# Patient Record
Sex: Female | Born: 1935 | Race: White | Hispanic: No | Smoking: Former smoker
Health system: Southern US, Community
[De-identification: ages and names within clinical notes are randomized; demographics above are authoritative.]

## PROBLEM LIST (undated history)

## (undated) DIAGNOSIS — Z950 Presence of cardiac pacemaker: Secondary | ICD-10-CM

## (undated) DIAGNOSIS — D649 Anemia, unspecified: Secondary | ICD-10-CM

## (undated) DIAGNOSIS — J9610 Chronic respiratory failure, unspecified whether with hypoxia or hypercapnia: Secondary | ICD-10-CM

## (undated) DIAGNOSIS — I459 Conduction disorder, unspecified: Secondary | ICD-10-CM

## (undated) DIAGNOSIS — Z955 Presence of coronary angioplasty implant and graft: Secondary | ICD-10-CM

## (undated) DIAGNOSIS — E039 Hypothyroidism, unspecified: Secondary | ICD-10-CM

## (undated) DIAGNOSIS — G459 Transient cerebral ischemic attack, unspecified: Secondary | ICD-10-CM

## (undated) DIAGNOSIS — I10 Essential (primary) hypertension: Secondary | ICD-10-CM

## (undated) DIAGNOSIS — J8410 Pulmonary fibrosis, unspecified: Secondary | ICD-10-CM

## (undated) DIAGNOSIS — E119 Type 2 diabetes mellitus without complications: Secondary | ICD-10-CM

## (undated) DIAGNOSIS — Z9289 Personal history of other medical treatment: Secondary | ICD-10-CM

## (undated) HISTORY — DX: Pulmonary fibrosis, unspecified: J84.10

## (undated) HISTORY — DX: Transient cerebral ischemic attack, unspecified: G45.9

## (undated) HISTORY — DX: Presence of cardiac pacemaker: Z95.0

## (undated) HISTORY — DX: Essential (primary) hypertension: I10

## (undated) HISTORY — DX: Hypothyroidism, unspecified: E03.9

## (undated) HISTORY — DX: Chronic respiratory failure, unspecified whether with hypoxia or hypercapnia: J96.10

## (undated) HISTORY — DX: Type 2 diabetes mellitus without complications: E11.9

## (undated) HISTORY — DX: Personal history of other medical treatment: Z92.89

## (undated) HISTORY — DX: Anemia, unspecified: D64.9

## (undated) HISTORY — DX: Presence of coronary angioplasty implant and graft: Z95.5

## (undated) HISTORY — DX: Conduction disorder, unspecified: I45.9

---

## 1979-08-23 HISTORY — PX: ABDOMINAL HYSTERECTOMY: SHX81

## 2000-07-30 ENCOUNTER — Emergency Department (HOSPITAL_COMMUNITY): Admission: EM | Admit: 2000-07-30 | Discharge: 2000-07-30 | Payer: Self-pay

## 2009-08-22 HISTORY — PX: BREAST LUMPECTOMY: SHX2

## 2010-08-22 HISTORY — PX: CORONARY STENT PLACEMENT: SHX1402

## 2016-08-22 HISTORY — PX: CARDIAC PACEMAKER PLACEMENT: SHX583

## 2021-07-29 ENCOUNTER — Other Ambulatory Visit: Payer: Self-pay

## 2021-07-29 ENCOUNTER — Ambulatory Visit (INDEPENDENT_AMBULATORY_CARE_PROVIDER_SITE_OTHER): Payer: Medicare Other | Admitting: Orthopedic Surgery

## 2021-07-29 ENCOUNTER — Encounter: Payer: Self-pay | Admitting: Orthopedic Surgery

## 2021-07-29 VITALS — BP 132/60 | HR 71 | Temp 96.6°F | Ht 60.0 in | Wt 116.4 lb

## 2021-07-29 DIAGNOSIS — I252 Old myocardial infarction: Secondary | ICD-10-CM

## 2021-07-29 DIAGNOSIS — J8410 Pulmonary fibrosis, unspecified: Secondary | ICD-10-CM | POA: Diagnosis not present

## 2021-07-29 DIAGNOSIS — E1165 Type 2 diabetes mellitus with hyperglycemia: Secondary | ICD-10-CM

## 2021-07-29 DIAGNOSIS — I442 Atrioventricular block, complete: Secondary | ICD-10-CM | POA: Diagnosis not present

## 2021-07-29 DIAGNOSIS — M25551 Pain in right hip: Secondary | ICD-10-CM

## 2021-07-29 DIAGNOSIS — E039 Hypothyroidism, unspecified: Secondary | ICD-10-CM

## 2021-07-29 DIAGNOSIS — E119 Type 2 diabetes mellitus without complications: Secondary | ICD-10-CM

## 2021-07-29 DIAGNOSIS — G8929 Other chronic pain: Secondary | ICD-10-CM

## 2021-07-29 DIAGNOSIS — R5383 Other fatigue: Secondary | ICD-10-CM

## 2021-07-29 DIAGNOSIS — K219 Gastro-esophageal reflux disease without esophagitis: Secondary | ICD-10-CM

## 2021-07-29 DIAGNOSIS — R413 Other amnesia: Secondary | ICD-10-CM

## 2021-07-29 DIAGNOSIS — H3530 Unspecified macular degeneration: Secondary | ICD-10-CM

## 2021-07-29 NOTE — Progress Notes (Signed)
Careteam: No care team member to display  Seen by: Windell Moulding, AGNP-C  PLACE OF SERVICE:  [place] Directive information Does Patient Have a Medical Advance Directive?: Yes, Type of Advance Directive: [place];Living will, Does patient want to make changes to medical advance directive?: No - Patient declined  Allergies  Allergen Reactions   Epinephrine    Lidocaine-Epinephrine     Chief Complaint  Patient presents with   New Patient (Initial Visit)    Patient presents today for a new patient appointment.     HPI: Patient is a 87 y.o. female seen today to establish at [facility].   Previous provider was [facility] in [place], [place]. Last provider was [facility] 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 Widowed. Has 3 children. Moved to [place] 5 days ago. Lives at [place] in [place]. 2 cats at home with her.   T2DM- diagnosed around around age 55. She does not take medication at this time. Sometimes checks her blood sugar, averages 120's. A1c checked a few months ago, cannot remember. No hypoglycemic events. Diet controlled. She does get yearly eye exams.   Thyroid- diagnosed with hypothyroidism in her teens. Does not take medication on a empty stomach. On levothyroxine daily.   Pulmonary fibrosis- diagnosed in 2012, she began wearing oxygen at night. 2 years she began wearing oxygen full time. Remains in 2 liters. Needs new oxygen equipment, especially concentrator. Denies sob. Requesting pulmonary referral.   Heart attack with stent in 2012. Remains on metoprolol daily. Denies recent chest pain. Complete heart block- s/p pacemaker 2018. Requesting cardiology referral.   Heartburn- taking nexium daily, does not know food triggers.   Macular degeneration- involves both eyes, remains on lutein-Zeaxanthin caps daily. Daughter working on finding her ophthalmologist. .   Right hip pain- could  not have surgery due to chronic conditions and interaction with anesthesia, uses rolator to ambulate. No recent falls. Taking tylenol arthritis twice daily.   Memory- daughter reports becoming more forgetful at times.  Hospitalized 07/2020- due to neck pain and low hemoglobin (6.8), received blood transfusion, remains on iron. Denies black tarry stools, hematuria or coffee ground emesis.   Family history: Father- deceased at age 49- complications from surgery Mother- deceased at age 49- enlarged heart Sister- alive age 52- dementia  Colonoscopy- aged out Mammograms- last done 2021, does not want anymore Bone density- believes she has had in the past, cannot recall result  Smoking: smoked for 50 years about 1.5 PPD Alcohol: small glass of wine 2-3x/week Drugs: no past drug use  Diet: drinks coffee daily, eats 2 small meals daily, follows low carb/sugar diet Exercise: no structured exercise plan due to hip pain, light walking daily  Dentist- Dr. Jetty Duhamel, scheduled to be seen in 2023  She has a living will and HPOA.   Advised to get Shingles vaccine at local pharmacy.     Review of Systems:  Review of Systems  Constitutional:  Positive for malaise/fatigue. Negative for chills, fever and weight loss.  HENT:  Positive for hearing loss and sore throat.   Eyes:  Positive for blurred vision.       Macular degeneration  Respiratory:  Positive for cough and shortness of breath. Negative for wheezing.        Oxygen use  Cardiovascular:  Negative for chest pain and leg swelling.  Gastrointestinal:  Positive for constipation and heartburn. Negative for  abdominal pain, blood in stool, diarrhea, nausea and vomiting.  Genitourinary:  Negative for dysuria and hematuria.  Musculoskeletal:  Positive for joint pain. Negative for falls.       Right hip pain  Skin: Negative.   Neurological:  Positive for weakness. Negative for dizziness and headaches.  Endo/Heme/Allergies:  Positive for  environmental allergies.  Psychiatric/Behavioral:  Positive for depression and memory loss. The patient is nervous/anxious.        Confusion   Past Medical History:  Diagnosis Date   Anemia    Heart block    History of blood transfusion    History of heart artery stent    Hypertension    Hypothyroid    Pacemaker    Pulmonary fibrosis (HCC)    Respiratory failure, chronic ([place])    Type 2 diabetes mellitus ([place])    Past Surgical History:  Procedure Laterality Date   ABDOMINAL HYSTERECTOMY  1981   BREAST LUMPECTOMY  2011   CARDIAC PACEMAKER PLACEMENT  2018   CORONARY STENT PLACEMENT  2012   Social History:   reports that she has quit smoking. Her smoking use included cigarettes. She has a 50.00 pack-year smoking history. She has never used smokeless tobacco. She reports current alcohol use. She reports that she does not use drugs.  Family History  Problem Relation Age of Onset   Other Mother        Enlarged Heart   Dementia Sister     Medications: Patient's Medications  New Prescriptions   No medications on file  Previous Medications   ACETAMINOPHEN (TYLENOL 8 HOUR ARTHRITIS PAIN PO)    Take 600 mg by mouth in the morning and at bedtime.   ESOMEPRAZOLE MAGNESIUM (NEXIUM PO)    Take by mouth.   LEVOTHYROXINE (SYNTHROID) 125 MCG TABLET    Take 125 mcg by mouth daily.   LUTEIN-ZEAXANTHIN 20-1 MG CAPS    Take by mouth.   METOPROLOL SUCCINATE PO    Take 25 mg by mouth daily.   OXYGEN    Inhale into the lungs. 2 Liters  Modified Medications   No medications on file  Discontinued Medications   No medications on file    Physical Exam:  Vitals:   07/29/21 1321  BP: 132/60  Pulse: 71  Temp: (!) 96.6 F (35.9 C)  SpO2: 99%  Weight: 116 lb 6.4 oz (52.8 kg)  Height: 5' (1.524 m)   Body mass index is 22.73 kg/m. Wt Readings from Last 3 Encounters:  07/29/21 116 lb 6.4 oz (52.8 kg)    Physical Exam Vitals reviewed.  Constitutional:      General: She is not in  acute distress. HENT:     Head: Normocephalic.  Eyes:     General:        Right eye: No discharge.        Left eye: No discharge.  Neck:     Vascular: No carotid bruit.  Cardiovascular:     Rate and Rhythm: Normal rate and regular rhythm.     Pulses: Normal pulses.     Heart sounds: Murmur heard.  Pulmonary:     Effort: Pulmonary effort is normal. No respiratory distress.     Breath sounds: Normal breath sounds. No wheezing.  Abdominal:     General: Bowel sounds are normal. There is no distension.     Palpations: Abdomen is soft.     Tenderness: There is no abdominal tenderness.  Musculoskeletal:     Cervical back:  Normal range of motion.     Right lower leg: No edema.     Left lower leg: No edema.  Lymphadenopathy:     Cervical: No cervical adenopathy.  Skin:    General: Skin is warm and dry.     Capillary Refill: Capillary refill takes less than 2 seconds.  Neurological:     General: No focal deficit present.     Mental Status: She is alert and oriented to person, place, and time.     Motor: Weakness present.     Gait: Gait abnormal.     Comments: rolator  Psychiatric:        Mood and Affect: Mood normal.        Behavior: Behavior normal.    Labs reviewed: Basic Metabolic Panel: No results for input(s): NA, K, CL, CO2, GLUCOSE, BUN, CREATININE, CALCIUM, MG, PHOS, TSH in the last 8760 hours. Liver Function Tests: No results for input(s): AST, ALT, ALKPHOS, BILITOT, PROT, ALBUMIN in the last 8760 hours. No results for input(s): LIPASE, AMYLASE in the last 8760 hours. No results for input(s): AMMONIA in the last 8760 hours. CBC: No results for input(s): WBC, NEUTROABS, HGB, HCT, MCV, PLT in the last 8760 hours. Lipid Panel: No results for input(s): CHOL, HDL, LDLCALC, TRIG, CHOLHDL, LDLDIRECT in the last 8760 hours. TSH: No results for input(s): TSH in the last 8760 hours. A1C: No results found for: HGBA1C   Assessment/Plan 1. Complete heart block ([place]) - s/p  pacemaker 2018 - Ambulatory referral to Cardiology  2. Pulmonary fibrosis ([place]) - remains on oxygen- 2 liters - exam unremarkable - Ambulatory referral to Pulmonology  3. Type 2 diabetes mellitus without long-term current use of insulin (HCC) - diet controlled - no recent hypoglycemia - gets yearly eye exams  - CMP - A1c- future - foot exam- future  4. Hypothyroidism, unspecified type - diagnosed around age 85 - cont levothyroxine - TSH- 1.35 07/29/2021  5. Gastroesophageal reflux disease without esophagitis - CBC with Differential/Platelet -hgb- 13.3 07/29/2021 - cont Prilosec daily  6. Macular degeneration of both eyes, unspecified type - plans to schedule with ophthalmology soon - cont lutein vitamin  7. Chronic right hip pain - poor surgical candidate  - abnormal gait, no left hip pain - ambulates with rolator - cont tylenol arthritis - start voltaren gel 1%- apply to right hip tid prn - may consider ortho for steroid injections  8. Memory impairment - daughter reports she is more forgetful - MMSE- future  9. Past heart attack - s/p 2012 - reports having stent placed - remains on metoprolol  Total time: 52 minutes. Greater than 50% of total time spent doing patient education on chronic conditions and health maintenance.    Next appt: 09/02/2021  Windell Moulding, [facility] [phone]

## 2021-07-29 NOTE — Patient Instructions (Addendum)
Shingrix vaccine- may get shingles vaccine at any pharmacy  Voltaren gel 1 %- apply to area 3-4x/daily  Recommend tylenol 1000 mg twice daily

## 2021-07-30 ENCOUNTER — Other Ambulatory Visit: Payer: Self-pay | Admitting: Orthopedic Surgery

## 2021-07-30 DIAGNOSIS — R413 Other amnesia: Secondary | ICD-10-CM | POA: Insufficient documentation

## 2021-07-30 DIAGNOSIS — H3530 Unspecified macular degeneration: Secondary | ICD-10-CM | POA: Insufficient documentation

## 2021-07-30 DIAGNOSIS — J8410 Pulmonary fibrosis, unspecified: Secondary | ICD-10-CM

## 2021-07-30 DIAGNOSIS — G8929 Other chronic pain: Secondary | ICD-10-CM | POA: Insufficient documentation

## 2021-07-30 DIAGNOSIS — K219 Gastro-esophageal reflux disease without esophagitis: Secondary | ICD-10-CM | POA: Insufficient documentation

## 2021-07-30 DIAGNOSIS — I252 Old myocardial infarction: Secondary | ICD-10-CM | POA: Insufficient documentation

## 2021-07-30 DIAGNOSIS — I442 Atrioventricular block, complete: Secondary | ICD-10-CM | POA: Insufficient documentation

## 2021-07-30 DIAGNOSIS — E039 Hypothyroidism, unspecified: Secondary | ICD-10-CM | POA: Insufficient documentation

## 2021-07-30 DIAGNOSIS — E1165 Type 2 diabetes mellitus with hyperglycemia: Secondary | ICD-10-CM | POA: Insufficient documentation

## 2021-08-02 ENCOUNTER — Other Ambulatory Visit: Payer: Self-pay

## 2021-08-02 ENCOUNTER — Other Ambulatory Visit: Payer: Self-pay | Admitting: Orthopedic Surgery

## 2021-08-02 ENCOUNTER — Other Ambulatory Visit: Payer: Self-pay | Admitting: *Deleted

## 2021-08-02 ENCOUNTER — Ambulatory Visit (INDEPENDENT_AMBULATORY_CARE_PROVIDER_SITE_OTHER): Payer: Medicare Other | Admitting: Cardiology

## 2021-08-02 ENCOUNTER — Encounter: Payer: Self-pay | Admitting: Cardiology

## 2021-08-02 ENCOUNTER — Telehealth: Payer: Self-pay | Admitting: *Deleted

## 2021-08-02 VITALS — BP 132/70 | HR 60 | Ht 60.0 in | Wt 116.6 lb

## 2021-08-02 DIAGNOSIS — Z9981 Dependence on supplemental oxygen: Secondary | ICD-10-CM

## 2021-08-02 DIAGNOSIS — I2510 Atherosclerotic heart disease of native coronary artery without angina pectoris: Secondary | ICD-10-CM

## 2021-08-02 DIAGNOSIS — E782 Mixed hyperlipidemia: Secondary | ICD-10-CM | POA: Diagnosis not present

## 2021-08-02 DIAGNOSIS — I480 Paroxysmal atrial fibrillation: Secondary | ICD-10-CM

## 2021-08-02 DIAGNOSIS — J8410 Pulmonary fibrosis, unspecified: Secondary | ICD-10-CM

## 2021-08-02 LAB — COMPREHENSIVE METABOLIC PANEL
AG Ratio: 1.7 (calc) (ref 1.0–2.5)
ALT: 18 U/L (ref 6–29)
AST: 17 U/L (ref 10–35)
Albumin: 4.3 g/dL (ref 3.6–5.1)
Alkaline phosphatase (APISO): 81 U/L (ref 37–153)
BUN: 14 mg/dL (ref 7–25)
CO2: 26 mmol/L (ref 20–32)
Calcium: 9.7 mg/dL (ref 8.6–10.4)
Chloride: 104 mmol/L (ref 98–110)
Creat: 0.76 mg/dL (ref 0.60–0.95)
Globulin: 2.6 g/dL (calc) (ref 1.9–3.7)
Glucose, Bld: 103 mg/dL (ref 65–139)
Potassium: 4.4 mmol/L (ref 3.5–5.3)
Sodium: 141 mmol/L (ref 135–146)
Total Bilirubin: 0.5 mg/dL (ref 0.2–1.2)
Total Protein: 6.9 g/dL (ref 6.1–8.1)

## 2021-08-02 LAB — CBC WITH DIFFERENTIAL/PLATELET
Absolute Monocytes: 540 cells/uL (ref 200–950)
Basophils Absolute: 58 cells/uL (ref 0–200)
Basophils Relative: 0.8 %
Eosinophils Absolute: 108 cells/uL (ref 15–500)
Eosinophils Relative: 1.5 %
HCT: 40.7 % (ref 35.0–45.0)
Hemoglobin: 13.3 g/dL (ref 11.7–15.5)
Lymphs Abs: 2016 cells/uL (ref 850–3900)
MCH: 28.8 pg (ref 27.0–33.0)
MCHC: 32.7 g/dL (ref 32.0–36.0)
MCV: 88.1 fL (ref 80.0–100.0)
MPV: 11.3 fL (ref 7.5–12.5)
Monocytes Relative: 7.5 %
Neutro Abs: 4478 cells/uL (ref 1500–7800)
Neutrophils Relative %: 62.2 %
Platelets: 309 10*3/uL (ref 140–400)
RBC: 4.62 10*6/uL (ref 3.80–5.10)
RDW: 13.1 % (ref 11.0–15.0)
Total Lymphocyte: 28 %
WBC: 7.2 10*3/uL (ref 3.8–10.8)

## 2021-08-02 LAB — TEST AUTHORIZATION

## 2021-08-02 LAB — HEMOGLOBIN A1C
Hgb A1c MFr Bld: 6.7 % of total Hgb — ABNORMAL HIGH (ref <5.7)
Mean Plasma Glucose: 146 mg/dL
eAG (mmol/L): 8.1 mmol/L

## 2021-08-02 LAB — TSH: TSH: 1.35 mIU/L (ref 0.40–4.50)

## 2021-08-02 MED ORDER — RIVAROXABAN 10 MG PO TABS: 10.0000 mg | ORAL_TABLET | Freq: Every day | ORAL | 3 refills | Status: DC

## 2021-08-02 NOTE — Telephone Encounter (Signed)
Orders placed- can you please fax to [facility]. Thanks

## 2021-08-02 NOTE — Telephone Encounter (Signed)
Order faxed to [facility]

## 2021-08-02 NOTE — Patient Instructions (Addendum)
Medication Instructions:  Your physician has recommended you make the following change in your medication:  START: Xarelto 10 mg once daily *If you need a refill on your cardiac medications before your next appointment, please call your pharmacy*   Lab Work: None If you have labs (blood work) drawn today and your tests are completely normal, you will receive your results only by: [place] (if you have MyChart) OR A paper copy in the mail If you have any lab test that is abnormal or we need to change your treatment, we will call you to review the results.   Testing/Procedures: Your physician has requested that you have an echocardiogram. Echocardiography is a painless test that uses sound waves to create images of your heart. It provides your doctor with information about the size and shape of your heart and how well your heart's chambers and valves are working. This procedure takes approximately one hour. There are no restrictions for this procedure.    Follow-Up: At [facility], you and your health needs are our priority.  As part of our continuing mission to provide you with exceptional heart care, we have created designated Provider Care Teams.  These Care Teams include your primary Cardiologist (physician) and Advanced Practice Providers (APPs -  Physician Assistants and Nurse Practitioners) who all work together to provide you with the care you need, when you need it.  We recommend signing up for the patient portal called "MyChart".  Sign up information is provided on this After Visit Summary.  MyChart is used to connect with patients for Virtual Visits (Telemedicine).  Patients are able to view lab/test results, encounter notes, upcoming appointments, etc.  Non-urgent messages can be sent to your provider as well.   To learn more about what you can do with MyChart, go to [email or web address].    Your next appointment:   6 month(s)  The format for your next appointment:    In Person  Provider:   Berniece Salines, DO   Other Instructions  Echocardiogram An echocardiogram is a test that uses sound waves (ultrasound) to produce images of the heart. Images from an echocardiogram can provide important information about: Heart size and shape. The size and thickness and movement of your heart's walls. Heart muscle function and strength. Heart valve function or if you have stenosis. Stenosis is when the heart valves are too narrow. If blood is flowing backward through the heart valves (regurgitation). A tumor or infectious growth around the heart valves. Areas of heart muscle that are not working well because of poor blood flow or injury from a heart attack. Aneurysm detection. An aneurysm is a weak or damaged part of an artery wall. The wall bulges out from the normal force of blood pumping through the body. Tell a health care provider about: Any allergies you have. All medicines you are taking, including vitamins, herbs, eye drops, creams, and over-the-counter medicines. Any blood disorders you have. Any surgeries you have had. Any medical conditions you have. Whether you are pregnant or may be pregnant. What are the risks? Generally, this is a safe test. However, problems may occur, including an allergic reaction to dye (contrast) that may be used during the test. What happens before the test? No specific preparation is needed. You may eat and drink normally. What happens during the test?  You will take off your clothes from the waist up and put on a hospital gown. Electrodes or electrocardiogram (ECG)patches may be placed on your chest. The electrodes  or patches are then connected to a device that monitors your heart rate and rhythm. You will lie down on a table for an ultrasound exam. A gel will be applied to your chest to help sound waves pass through your skin. A handheld device, called a transducer, will be pressed against your chest and moved over your  heart. The transducer produces sound waves that travel to your heart and bounce back (or "echo" back) to the transducer. These sound waves will be captured in real-time and changed into images of your heart that can be viewed on a video monitor. The images will be recorded on a computer and reviewed by your health care provider. You may be asked to change positions or hold your breath for a short time. This makes it easier to get different views or better views of your heart. In some cases, you may receive contrast through an IV in one of your veins. This can improve the quality of the pictures from your heart. The procedure may vary among health care providers and hospitals. What can I expect after the test? You may return to your normal, everyday life, including diet, activities, and medicines, unless your health care provider tells you not to do that. Follow these instructions at home: It is up to you to get the results of your test. Ask your health care provider, or the department that is doing the test, when your results will be ready. Keep all follow-up visits. This is important. Summary An echocardiogram is a test that uses sound waves (ultrasound) to produce images of the heart. Images from an echocardiogram can provide important information about the size and shape of your heart, heart muscle function, heart valve function, and other possible heart problems. You do not need to do anything to prepare before this test. You may eat and drink normally. After the echocardiogram is completed, you may return to your normal, everyday life, unless your health care provider tells you not to do that. This information is not intended to replace advice given to you by your health care provider. Make sure you discuss any questions you have with your health care provider. Document Revised: 04/21/2021 Document Reviewed: 03/31/2020 Elsevier Patient Education  2022 [employer].

## 2021-08-02 NOTE — Progress Notes (Signed)
Cardiology Office Note:    Date:  08/05/2021   ID:  Monica Murillo, DOB 08-Dec-1932, MRN [id number]  PCP:  Yvonna Alanis, NP  Cardiologist:  Berniece Salines, DO  Electrophysiologist:  None   Referring MD: Yvonna Alanis, NP   "I am fine"  History of Present Illness:    Monica BRENNER is a 87 y.o. female with a hx of coronary artery disease status post stent in 2012, complete heart block status post dual-chamber [employer] pacemaker, paroxysmal atrial fibrillation per her cardiologist note she was on Xarelto 10 mg daily, hyperlipidemia and pulmonary fibrosis.  The patient is moved to [place] recently to live near her daughter and is here to establish cardiac care. Today she offers no complaints.  She tells me that she is doing well.  Past Medical History:  Diagnosis Date   Anemia    Heart block    History of blood transfusion    History of heart artery stent    Hypertension    Hypothyroid    Pacemaker    Pulmonary fibrosis (HCC)    Respiratory failure, chronic (HCC)    Type 2 diabetes mellitus ([place])     Past Surgical History:  Procedure Laterality Date   ABDOMINAL HYSTERECTOMY  1981   BREAST LUMPECTOMY  2011   CARDIAC PACEMAKER PLACEMENT  2018   CORONARY STENT PLACEMENT  2012    Current Medications: Current Meds  Medication Sig   Acetaminophen (TYLENOL 8 HOUR ARTHRITIS PAIN PO) Take 600 mg by mouth in the morning and at bedtime.   Esomeprazole Magnesium (NEXIUM PO) Take by mouth.   levothyroxine (SYNTHROID) 125 MCG tablet Take 125 mcg by mouth daily.   Lutein-Zeaxanthin 20-1 MG CAPS Take by mouth.   METOPROLOL SUCCINATE PO Take 25 mg by mouth daily.   OXYGEN Inhale into the lungs. 2 Liters   [DISCONTINUED] rivaroxaban (XARELTO) 10 MG TABS tablet Take 1 tablet (10 mg total) by mouth daily.     Allergies:   Epinephrine, Lidocaine-epinephrine, and Sulfa antibiotics   Social History   Socioeconomic History   Marital status: Widowed    Spouse name:  Not on file   Number of children: Not on file   Years of education: Not on file   Highest education level: Not on file  Occupational History   Not on file  Tobacco Use   Smoking status: Former    Packs/day: 1.00    Years: 50.00    Pack years: 50.00    Types: Cigarettes   Smokeless tobacco: Never  Vaping Use   Vaping Use: Never used  Substance and Sexual Activity   Alcohol use: Yes    Comment: 2-3 times a week   Drug use: Never   Sexual activity: Not on file  Other Topics Concern   Not on file  Social History Narrative   Diet:      Caffeine:Coffee      Married, if yes what year: Widow, 1959      Do you live in a house, apartment, assisted living, condo, trailer, ect: Senior Living Apartment      Is it one or more stories:       How many persons live in your home? 1      Pets: 2 Cats      Highest level or education completed: Nursing bachelors      Current/Past profession:      Exercise: No  Type and how often: Walking         Living Will: Yes   DNR: No   POA/HPOA: Yes      Functional Status:   Do you have difficulty bathing or dressing yourself? Yes, sometimes dressing   Do you have difficulty preparing food or eating? No    Do you have difficulty managing your medications? No    Do you have difficulty managing your finances? Yes   Do you have difficulty affording your medications? No    Social Determinants of Radio broadcast assistant Strain: Not on file  Food Insecurity: Not on file  Transportation Needs: Not on file  Physical Activity: Not on file  Stress: Not on file  Social Connections: Not on file     Family History: The patient's family history includes Dementia in her sister; Other in her mother.  ROS:   Review of Systems  Constitution: Negative for decreased appetite, fever and weight gain.  HENT: Negative for congestion, ear discharge, hoarse voice and sore throat.   Eyes: Negative for discharge, redness, vision loss in  right eye and visual halos.  Cardiovascular: Negative for chest pain, dyspnea on exertion, leg swelling, orthopnea and palpitations.  Respiratory: Negative for cough, hemoptysis, shortness of breath and snoring.   Endocrine: Negative for heat intolerance and polyphagia.  Hematologic/Lymphatic: Negative for bleeding problem. Does not bruise/bleed easily.  Skin: Negative for flushing, nail changes, rash and suspicious lesions.  Musculoskeletal: Negative for arthritis, joint pain, muscle cramps, myalgias, neck pain and stiffness.  Gastrointestinal: Negative for abdominal pain, bowel incontinence, diarrhea and excessive appetite.  Genitourinary: Negative for decreased libido, genital sores and incomplete emptying.  Neurological: Negative for brief paralysis, focal weakness, headaches and loss of balance.  Psychiatric/Behavioral: Negative for altered mental status, depression and suicidal ideas.  Allergic/Immunologic: Negative for HIV exposure and persistent infections.    EKGs/Labs/Other Studies Reviewed:    The following studies were reviewed today:   EKG:  The ekg ordered today demonstrates   Recent Labs: 07/29/2021: ALT 18; BUN 14; Creat 0.76; Hemoglobin 13.3; Platelets 309; Potassium 4.4; Sodium 141; TSH 1.35  Recent Lipid Panel No results found for: CHOL, TRIG, HDL, CHOLHDL, VLDL, LDLCALC, LDLDIRECT  Physical Exam:    VS:  BP 132/70 (BP Location: Right Arm)   Pulse 60   Ht 5' (1.524 m)   Wt 116 lb 9.6 oz (52.9 kg)   SpO2 94%   BMI 22.77 kg/m     Wt Readings from Last 3 Encounters:  08/02/21 116 lb 9.6 oz (52.9 kg)  07/29/21 116 lb 6.4 oz (52.8 kg)     GEN: Well nourished, well developed in no acute distress HEENT: Normal NECK: No JVD; No carotid bruits LYMPHATICS: No lymphadenopathy CARDIAC: S1S2 noted,RRR, no murmurs, rubs, gallops RESPIRATORY:  Clear to auscultation without rales, wheezing or rhonchi  ABDOMEN: Soft, non-tender, non-distended, +bowel sounds, no  guarding. EXTREMITIES: No edema, No cyanosis, no clubbing MUSCULOSKELETAL:  No deformity  SKIN: Warm and dry NEUROLOGIC:  Alert and oriented x 3, non-focal PSYCHIATRIC:  Normal affect, good insight  ASSESSMENT:    1. PAF (paroxysmal atrial fibrillation) ([place])   2. Coronary artery disease involving native coronary artery of native heart, unspecified whether angina present   3. Mixed hyperlipidemia    PLAN:      She has no anginal symptoms in the office today.  We went over all of her medications it appears that the patient had not been taking her Xarelto 10  mg daily.  She notes that her previous cardiologist has stopped this medication but based on his note which was brought in by the patient's daughter from her most recent visit it appears that the cardiologist had continued the patient on Xarelto 10 mg daily.  She has not had any major bleeding or any GI bleeding so we will go ahead and restart her Xarelto for her stroke prevention in the setting of atrial fibrillation.  She has paroxysmal atrial fibrillation and her CHA2DS2-VASc score is 4.  I talked to the patient about restarting of the Xarelto she does agree with this plan.  I will set her up also with my EP partners given the fact that she has her pacemaker implantation to get her set up with our pacemaker clinic for her interrogations.  With her history of coronary artery disease she is not on a statin.  She is apprehensive right now we will discuss this again to start the patient on low-dose statin medications.  We will plan for repeat lipid profile as well.  The patient is in agreement with the above plan. The patient left the office in stable condition.  The patient will follow up in   Medication Adjustments/Labs and Tests Ordered: Current medicines are reviewed at length with the patient today.  Concerns regarding medicines are outlined above.  Orders Placed This Encounter  Procedures   Ambulatory referral to Cardiac  Electrophysiology   EKG 12-Lead   ECHOCARDIOGRAM COMPLETE   Meds ordered this encounter  Medications   DISCONTD: rivaroxaban (XARELTO) 10 MG TABS tablet    Sig: Take 1 tablet (10 mg total) by mouth daily.    Dispense:  90 tablet    Refill:  3    Patient Instructions  Medication Instructions:  Your physician has recommended you make the following change in your medication:  START: Xarelto 10 mg once daily *If you need a refill on your cardiac medications before your next appointment, please call your pharmacy*   Lab Work: None If you have labs (blood work) drawn today and your tests are completely normal, you will receive your results only by: [place] (if you have MyChart) OR A paper copy in the mail If you have any lab test that is abnormal or we need to change your treatment, we will call you to review the results.   Testing/Procedures: Your physician has requested that you have an echocardiogram. Echocardiography is a painless test that uses sound waves to create images of your heart. It provides your doctor with information about the size and shape of your heart and how well your heart's chambers and valves are working. This procedure takes approximately one hour. There are no restrictions for this procedure.    Follow-Up: At [facility], you and your health needs are our priority.  As part of our continuing mission to provide you with exceptional heart care, we have created designated Provider Care Teams.  These Care Teams include your primary Cardiologist (physician) and Advanced Practice Providers (APPs -  Physician Assistants and Nurse Practitioners) who all work together to provide you with the care you need, when you need it.  We recommend signing up for the patient portal called "MyChart".  Sign up information is provided on this After Visit Summary.  MyChart is used to connect with patients for Virtual Visits (Telemedicine).  Patients are able to view lab/test  results, encounter notes, upcoming appointments, etc.  Non-urgent messages can be sent to your provider as well.  To learn more about what you can do with MyChart, go to [email or web address].    Your next appointment:   6 month(s)  The format for your next appointment:   In Person  Provider:   Berniece Salines, DO   Other Instructions  Echocardiogram An echocardiogram is a test that uses sound waves (ultrasound) to produce images of the heart. Images from an echocardiogram can provide important information about: Heart size and shape. The size and thickness and movement of your heart's walls. Heart muscle function and strength. Heart valve function or if you have stenosis. Stenosis is when the heart valves are too narrow. If blood is flowing backward through the heart valves (regurgitation). A tumor or infectious growth around the heart valves. Areas of heart muscle that are not working well because of poor blood flow or injury from a heart attack. Aneurysm detection. An aneurysm is a weak or damaged part of an artery wall. The wall bulges out from the normal force of blood pumping through the body. Tell a health care provider about: Any allergies you have. All medicines you are taking, including vitamins, herbs, eye drops, creams, and over-the-counter medicines. Any blood disorders you have. Any surgeries you have had. Any medical conditions you have. Whether you are pregnant or may be pregnant. What are the risks? Generally, this is a safe test. However, problems may occur, including an allergic reaction to dye (contrast) that may be used during the test. What happens before the test? No specific preparation is needed. You may eat and drink normally. What happens during the test?  You will take off your clothes from the waist up and put on a hospital gown. Electrodes or electrocardiogram (ECG)patches may be placed on your chest. The electrodes or patches are then connected to  a device that monitors your heart rate and rhythm. You will lie down on a table for an ultrasound exam. A gel will be applied to your chest to help sound waves pass through your skin. A handheld device, called a transducer, will be pressed against your chest and moved over your heart. The transducer produces sound waves that travel to your heart and bounce back (or "echo" back) to the transducer. These sound waves will be captured in real-time and changed into images of your heart that can be viewed on a video monitor. The images will be recorded on a computer and reviewed by your health care provider. You may be asked to change positions or hold your breath for a short time. This makes it easier to get different views or better views of your heart. In some cases, you may receive contrast through an IV in one of your veins. This can improve the quality of the pictures from your heart. The procedure may vary among health care providers and hospitals. What can I expect after the test? You may return to your normal, everyday life, including diet, activities, and medicines, unless your health care provider tells you not to do that. Follow these instructions at home: It is up to you to get the results of your test. Ask your health care provider, or the department that is doing the test, when your results will be ready. Keep all follow-up visits. This is important. Summary An echocardiogram is a test that uses sound waves (ultrasound) to produce images of the heart. Images from an echocardiogram can provide important information about the size and shape of your heart, heart muscle function, heart valve function, and other possible heart problems.  You do not need to do anything to prepare before this test. You may eat and drink normally. After the echocardiogram is completed, you may return to your normal, everyday life, unless your health care provider tells you not to do that. This information is not  intended to replace advice given to you by your health care provider. Make sure you discuss any questions you have with your health care provider. Document Revised: 04/21/2021 Document Reviewed: 03/31/2020 Elsevier Patient Education  2022 [place].   Adopting a Healthy Lifestyle.  Know what a healthy weight is for you (roughly BMI <25) and aim to maintain this   Aim for 7+ servings of fruits and vegetables daily   65-80+ fluid ounces of water or unsweet tea for healthy kidneys   Limit to max 1 drink of alcohol per day; avoid smoking/tobacco   Limit animal fats in diet for cholesterol and heart health - choose grass fed whenever available   Avoid highly processed foods, and foods high in saturated/trans fats   Aim for low stress - take time to unwind and care for your mental health   Aim for 150 min of moderate intensity exercise weekly for heart health, and weights twice weekly for bone health   Aim for 7-9 hours of sleep daily   When it comes to diets, agreement about the perfect plan isnt easy to find, even among the experts. Experts at the [employer] developed an idea known as the Healthy Eating Plate. Just imagine a plate divided into logical, healthy portions.   The emphasis is on diet quality:   Load up on vegetables and fruits - one-half of your plate: Aim for color and variety, and remember that potatoes dont count.   Go for whole grains - one-quarter of your plate: Whole wheat, barley, wheat berries, quinoa, oats, brown rice, and foods made with them. If you want pasta, go with whole wheat pasta.   Protein power - one-quarter of your plate: Fish, chicken, beans, and nuts are all healthy, versatile protein sources. Limit red meat.   The diet, however, does go beyond the plate, offering a few other suggestions.   Use healthy plant oils, such as olive, canola, soy, corn, sunflower and peanut. Check the labels, and avoid partially hydrogenated  oil, which have unhealthy trans fats.   If youre thirsty, drink water. Coffee and tea are good in moderation, but skip sugary drinks and limit milk and dairy products to one or two daily servings.   The type of carbohydrate in the diet is more important than the amount. Some sources of carbohydrates, such as vegetables, fruits, whole grains, and beans-are healthier than others.   Finally, stay active  Signed, Berniece Salines, DO  08/05/2021 9:33 PM    [facility]

## 2021-08-02 NOTE — Telephone Encounter (Signed)
Patient daughter Monica Murillo called and stated that [facility] received the orders for Oxygen but now they need an Order faxed to them for an Overnight Oximetry.   Needs order faxed to [place].  Please Advise.

## 2021-08-03 MED ORDER — RIVAROXABAN 10 MG PO TABS: 10.0000 mg | ORAL_TABLET | Freq: Every day | ORAL | 3 refills | Status: DC

## 2021-08-10 ENCOUNTER — Ambulatory Visit (INDEPENDENT_AMBULATORY_CARE_PROVIDER_SITE_OTHER): Payer: Medicare Other | Admitting: Pulmonary Disease

## 2021-08-10 ENCOUNTER — Encounter: Payer: Self-pay | Admitting: Pulmonary Disease

## 2021-08-10 ENCOUNTER — Other Ambulatory Visit: Payer: Self-pay

## 2021-08-10 VITALS — BP 126/68 | HR 66 | Ht 60.0 in | Wt 116.4 lb

## 2021-08-10 DIAGNOSIS — J9611 Chronic respiratory failure with hypoxia: Secondary | ICD-10-CM

## 2021-08-10 DIAGNOSIS — J849 Interstitial pulmonary disease, unspecified: Secondary | ICD-10-CM

## 2021-08-10 NOTE — Patient Instructions (Addendum)
We will place home oxygen orders for a home concentrator and supplies.   Once your home oxygen is established with a company in [place] we will check your oxygen levels at night when sleeping.   We will check a CT Chest scan  We will have you follow up in 6-8 weeks for pulmonary function tests

## 2021-08-10 NOTE — Progress Notes (Signed)
Synopsis: Referred in December 2022 for Chronic respiratory failure by Windell Moulding, NP  Subjective:   PATIENT ID: Monica Murillo GENDER: female DOB: Feb 10, 1933, MRN: [id number]   HPI  Chief Complaint  Patient presents with   Consult    Referred by PCP for history of pulmonary fibrosis and resp failure. Currently on 2L 24/7.     Monica Murillo is an 87 year old woman, former smoker with chronic hypoxemic respiratory failure due to interstitial lung disease who was referred to pulmonary clinic to establish care.  She recently moved from [place] to [place] and is here to establish care with a new pulmonology group.  She is currently using 2 L of supplemental oxygen throughout the day and night.  Record review notes that she has history of interstitial lung disease with pulmonary fibrosis.  She is a former smoker with a 50+ pack year smoking history.  She quit in the early 2000's.  No formal PFTs or CT chest were done by her primary care team.  She has established care with Dr. Berniece Salines of cardiology and an echo has been ordered.  Patient denies any chest pain, lower extremity edema, cough, wheezing or sputum production.  She is accompanied by her daughter today.  Past Medical History:  Diagnosis Date   Anemia    Heart block    History of blood transfusion    History of heart artery stent    Hypertension    Hypothyroid    Pacemaker    Pulmonary fibrosis (HCC)    Respiratory failure, chronic (HCC)    Type 2 diabetes mellitus ([place])      Family History  Problem Relation Age of Onset   Other Mother        Enlarged Heart   Dementia Sister      Social History   Socioeconomic History   Marital status: Widowed    Spouse name: Not on file   Number of children: Not on file   Years of education: Not on file   Highest education level: Not on file  Occupational History   Not on file  Tobacco Use   Smoking status: Former    Packs/day: 1.00    Years: 50.00     Pack years: 50.00    Types: Cigarettes   Smokeless tobacco: Never  Vaping Use   Vaping Use: Never used  Substance and Sexual Activity   Alcohol use: Yes    Comment: 2-3 times a week   Drug use: Never   Sexual activity: Not on file  Other Topics Concern   Not on file  Social History Narrative   Diet:      Caffeine:Coffee      Married, if yes what year: Widow, 1959      Do you live in a house, apartment, assisted living, condo, trailer, ect: Senior Living Apartment      Is it one or more stories:       How many persons live in your home? 1      Pets: 2 Cats      Highest level or education completed: Nursing bachelors      Current/Past profession:      Exercise: No                 Type and how often: Walking         Living Will: Yes   DNR: No   POA/HPOA: Yes      Functional Status:  Do you have difficulty bathing or dressing yourself? Yes, sometimes dressing   Do you have difficulty preparing food or eating? No    Do you have difficulty managing your medications? No    Do you have difficulty managing your finances? Yes   Do you have difficulty affording your medications? No    Social Determinants of Radio broadcast assistant Strain: Not on file  Food Insecurity: Not on file  Transportation Needs: Not on file  Physical Activity: Not on file  Stress: Not on file  Social Connections: Not on file  Intimate Partner Violence: Not on file     Allergies  Allergen Reactions   Epinephrine    Lidocaine-Epinephrine    Sulfa Antibiotics Nausea And Vomiting and Rash    Sulfa and related       Outpatient Medications Prior to Visit  Medication Sig Dispense Refill   Acetaminophen (TYLENOL 8 HOUR ARTHRITIS PAIN PO) Take 600 mg by mouth in the morning and at bedtime.     Esomeprazole Magnesium (NEXIUM PO) Take by mouth.     levothyroxine (SYNTHROID) 125 MCG tablet Take 125 mcg by mouth daily.     Lutein-Zeaxanthin 20-1 MG CAPS Take by mouth.     METOPROLOL  SUCCINATE PO Take 25 mg by mouth daily.     OXYGEN Inhale into the lungs. 2 Liters     rivaroxaban (XARELTO) 10 MG TABS tablet Take 1 tablet (10 mg total) by mouth daily. 90 tablet 3   No facility-administered medications prior to visit.    Review of Systems  Constitutional:  Negative for chills, fever, malaise/fatigue and weight loss.  HENT:  Negative for congestion, sinus pain and sore throat.   Eyes: Negative.   Respiratory:  Positive for shortness of breath. Negative for cough, hemoptysis, sputum production and wheezing.   Cardiovascular:  Negative for chest pain, palpitations, orthopnea, claudication and leg swelling.  Gastrointestinal:  Negative for abdominal pain, heartburn, nausea and vomiting.  Genitourinary: Negative.   Musculoskeletal:  Negative for joint pain and myalgias.  Skin:  Negative for rash.  Neurological:  Negative for weakness.  Endo/Heme/Allergies: Negative.   Psychiatric/Behavioral: Negative.       Objective:   Vitals:   08/10/21 1437  BP: 126/68  Pulse: 66  SpO2: 94%  Weight: 116 lb 6.4 oz (52.8 kg)  Height: 5' (1.524 m)     Physical Exam Constitutional:      General: She is not in acute distress.    Appearance: She is not ill-appearing.  HENT:     Head: Normocephalic and atraumatic.  Eyes:     General: No scleral icterus.    Conjunctiva/sclera: Conjunctivae normal.     Pupils: Pupils are equal, round, and reactive to light.  Cardiovascular:     Rate and Rhythm: Normal rate and regular rhythm.     Pulses: Normal pulses.     Heart sounds: Normal heart sounds. No murmur heard. Pulmonary:     Effort: Pulmonary effort is normal.     Breath sounds: Rales (bibasilar) present. No wheezing or rhonchi.  Abdominal:     General: Bowel sounds are normal.     Palpations: Abdomen is soft.  Musculoskeletal:     Right lower leg: No edema.     Left lower leg: No edema.  Lymphadenopathy:     Cervical: No cervical adenopathy.  Skin:    General: Skin  is warm and dry.  Neurological:     General: No focal deficit present.  Mental Status: She is alert.  Psychiatric:        Mood and Affect: Mood normal.        Behavior: Behavior normal.        Thought Content: Thought content normal.        Judgment: Judgment normal.    CBC    Component Value Date/Time   WBC 7.2 07/29/2021 1431   RBC 4.62 07/29/2021 1431   HGB 13.3 07/29/2021 1431   HCT 40.7 07/29/2021 1431   PLT 309 07/29/2021 1431   MCV 88.1 07/29/2021 1431   MCH 28.8 07/29/2021 1431   MCHC 32.7 07/29/2021 1431   RDW 13.1 07/29/2021 1431   LYMPHSABS 2,016 07/29/2021 1431   EOSABS 108 07/29/2021 1431   BASOSABS 58 07/29/2021 1431   BMP Latest Ref Rng & Units 07/29/2021  Glucose 65 - 139 mg/dL 103  BUN 7 - 25 mg/dL 14  Creatinine 0.60 - 0.95 mg/dL 0.76  BUN/Creat Ratio 6 - 22 (calc) NOT APPLICABLE  Sodium 161 - 146 mmol/L 141  Potassium 3.5 - 5.3 mmol/L 4.4  Chloride 98 - 110 mmol/L 104  CO2 20 - 32 mmol/L 26  Calcium 8.6 - 10.4 mg/dL 9.7   Chest imaging:  PFT: No flowsheet data found.  Labs:  Path:  Echo:  Heart Catheterization:  Assessment & Plan:   ILD (interstitial lung disease) ([place]) - Plan: CT CHEST HIGH RESOLUTION, Pulmonary Function Test  Chronic respiratory failure with hypoxia (HCC)  Discussion: Monica Murillo is an 87 year old woman, former smoker with chronic hypoxemic respiratory failure due to interstitial lung disease who was referred to pulmonary clinic to establish care.  Patient has history of interstitial lung disease as documented from her care team in [place].  She is to continue supplemental oxygen 2 L throughout the day and night. We will check a high resolution CT chest scan and pulmonary function tests for further evaluation.  We discussed the importance of maintaining her current conditioning with frequent exercise/walking.  We will consider referral to pulmonary rehab in the future once we have work-up complete.  We will  order her home oxygen supplies and a home oxygen concentrator.  She currently has a portable oxygen concentrator.  Follow-up in 6 to 8 weeks with pulmonary function testing.  Freda Jackson, MD [facility] Office: [phone]    Current Outpatient Medications:    Acetaminophen (TYLENOL 8 HOUR ARTHRITIS PAIN PO), Take 600 mg by mouth in the morning and at bedtime., Disp: , Rfl:    Esomeprazole Magnesium (NEXIUM PO), Take by mouth., Disp: , Rfl:    levothyroxine (SYNTHROID) 125 MCG tablet, Take 125 mcg by mouth daily., Disp: , Rfl:    Lutein-Zeaxanthin 20-1 MG CAPS, Take by mouth., Disp: , Rfl:    METOPROLOL SUCCINATE PO, Take 25 mg by mouth daily., Disp: , Rfl:    OXYGEN, Inhale into the lungs. 2 Liters, Disp: , Rfl:    rivaroxaban (XARELTO) 10 MG TABS tablet, Take 1 tablet (10 mg total) by mouth daily., Disp: 90 tablet, Rfl: 3

## 2021-08-18 ENCOUNTER — Inpatient Hospital Stay (HOSPITAL_COMMUNITY)
Admission: EM | Admit: 2021-08-18 | Discharge: 2021-08-23 | DRG: 481 | Disposition: A | Discharge status: Skilled Nursing Facility | Payer: Medicare Other | Attending: Internal Medicine | Admitting: Internal Medicine

## 2021-08-18 ENCOUNTER — Emergency Department (HOSPITAL_COMMUNITY): Payer: Medicare Other

## 2021-08-18 DIAGNOSIS — Z20822 Contact with and (suspected) exposure to covid-19: Secondary | ICD-10-CM | POA: Diagnosis present

## 2021-08-18 DIAGNOSIS — Z87891 Personal history of nicotine dependence: Secondary | ICD-10-CM

## 2021-08-18 DIAGNOSIS — I442 Atrioventricular block, complete: Secondary | ICD-10-CM | POA: Diagnosis present

## 2021-08-18 DIAGNOSIS — K219 Gastro-esophageal reflux disease without esophagitis: Secondary | ICD-10-CM | POA: Diagnosis present

## 2021-08-18 DIAGNOSIS — M25551 Pain in right hip: Secondary | ICD-10-CM | POA: Diagnosis not present

## 2021-08-18 DIAGNOSIS — Z955 Presence of coronary angioplasty implant and graft: Secondary | ICD-10-CM

## 2021-08-18 DIAGNOSIS — I480 Paroxysmal atrial fibrillation: Secondary | ICD-10-CM | POA: Diagnosis present

## 2021-08-18 DIAGNOSIS — Z888 Allergy status to other drugs, medicaments and biological substances status: Secondary | ICD-10-CM

## 2021-08-18 DIAGNOSIS — I10 Essential (primary) hypertension: Secondary | ICD-10-CM

## 2021-08-18 DIAGNOSIS — J9611 Chronic respiratory failure with hypoxia: Secondary | ICD-10-CM | POA: Diagnosis present

## 2021-08-18 DIAGNOSIS — Z79899 Other long term (current) drug therapy: Secondary | ICD-10-CM

## 2021-08-18 DIAGNOSIS — Z79890 Hormone replacement therapy (postmenopausal): Secondary | ICD-10-CM

## 2021-08-18 DIAGNOSIS — S72144A Nondisplaced intertrochanteric fracture of right femur, initial encounter for closed fracture: Secondary | ICD-10-CM

## 2021-08-18 DIAGNOSIS — E039 Hypothyroidism, unspecified: Secondary | ICD-10-CM | POA: Diagnosis not present

## 2021-08-18 DIAGNOSIS — W050XXA Fall from non-moving wheelchair, initial encounter: Secondary | ICD-10-CM | POA: Diagnosis present

## 2021-08-18 DIAGNOSIS — I252 Old myocardial infarction: Secondary | ICD-10-CM

## 2021-08-18 DIAGNOSIS — M549 Dorsalgia, unspecified: Secondary | ICD-10-CM | POA: Diagnosis present

## 2021-08-18 DIAGNOSIS — I2510 Atherosclerotic heart disease of native coronary artery without angina pectoris: Secondary | ICD-10-CM | POA: Diagnosis present

## 2021-08-18 DIAGNOSIS — J8410 Pulmonary fibrosis, unspecified: Secondary | ICD-10-CM | POA: Diagnosis present

## 2021-08-18 DIAGNOSIS — Z9981 Dependence on supplemental oxygen: Secondary | ICD-10-CM

## 2021-08-18 DIAGNOSIS — S7291XA Unspecified fracture of right femur, initial encounter for closed fracture: Secondary | ICD-10-CM | POA: Diagnosis present

## 2021-08-18 DIAGNOSIS — M80051A Age-related osteoporosis with current pathological fracture, right femur, initial encounter for fracture: Principal | ICD-10-CM | POA: Diagnosis present

## 2021-08-18 DIAGNOSIS — Z66 Do not resuscitate: Secondary | ICD-10-CM | POA: Diagnosis present

## 2021-08-18 DIAGNOSIS — Z882 Allergy status to sulfonamides status: Secondary | ICD-10-CM

## 2021-08-18 DIAGNOSIS — M1611 Unilateral primary osteoarthritis, right hip: Secondary | ICD-10-CM | POA: Diagnosis present

## 2021-08-18 DIAGNOSIS — S72001A Fracture of unspecified part of neck of right femur, initial encounter for closed fracture: Secondary | ICD-10-CM

## 2021-08-18 DIAGNOSIS — Z419 Encounter for procedure for purposes other than remedying health state, unspecified: Secondary | ICD-10-CM

## 2021-08-18 DIAGNOSIS — Y92009 Unspecified place in unspecified non-institutional (private) residence as the place of occurrence of the external cause: Secondary | ICD-10-CM

## 2021-08-18 DIAGNOSIS — Z950 Presence of cardiac pacemaker: Secondary | ICD-10-CM

## 2021-08-18 DIAGNOSIS — E1165 Type 2 diabetes mellitus with hyperglycemia: Secondary | ICD-10-CM | POA: Diagnosis present

## 2021-08-18 DIAGNOSIS — Z90710 Acquired absence of both cervix and uterus: Secondary | ICD-10-CM

## 2021-08-18 DIAGNOSIS — Z09 Encounter for follow-up examination after completed treatment for conditions other than malignant neoplasm: Secondary | ICD-10-CM

## 2021-08-18 DIAGNOSIS — W19XXXA Unspecified fall, initial encounter: Secondary | ICD-10-CM

## 2021-08-18 DIAGNOSIS — Z7901 Long term (current) use of anticoagulants: Secondary | ICD-10-CM

## 2021-08-18 DIAGNOSIS — Y92039 Unspecified place in apartment as the place of occurrence of the external cause: Secondary | ICD-10-CM

## 2021-08-18 DIAGNOSIS — Z886 Allergy status to analgesic agent status: Secondary | ICD-10-CM

## 2021-08-18 DIAGNOSIS — D72829 Elevated white blood cell count, unspecified: Secondary | ICD-10-CM | POA: Diagnosis present

## 2021-08-18 LAB — COMPREHENSIVE METABOLIC PANEL
ALT: 21 U/L (ref 0–44)
AST: 28 U/L (ref 15–41)
Albumin: 3.9 g/dL (ref 3.5–5.0)
Alkaline Phosphatase: 82 U/L (ref 38–126)
Anion gap: 12 (ref 5–15)
BUN: 17 mg/dL (ref 8–23)
CO2: 20 mmol/L — ABNORMAL LOW (ref 22–32)
Calcium: 8.8 mg/dL — ABNORMAL LOW (ref 8.9–10.3)
Chloride: 104 mmol/L (ref 98–111)
Creatinine, Ser: 0.76 mg/dL (ref 0.44–1.00)
GFR, Estimated: >60 mL/min (ref >60)
Glucose, Bld: 196 mg/dL — ABNORMAL HIGH (ref 70–99)
Potassium: 4.3 mmol/L (ref 3.5–5.1)
Sodium: 136 mmol/L (ref 135–145)
Total Bilirubin: 1 mg/dL (ref 0.3–1.2)
Total Protein: 6.1 g/dL — ABNORMAL LOW (ref 6.5–8.1)

## 2021-08-18 LAB — CBC WITH DIFFERENTIAL/PLATELET
Abs Immature Granulocytes: 0.13 10*3/uL — ABNORMAL HIGH (ref 0.00–0.07)
Basophils Absolute: 0.1 10*3/uL (ref 0.0–0.1)
Basophils Relative: 1 %
Eosinophils Absolute: 0.1 10*3/uL (ref 0.0–0.5)
Eosinophils Relative: 1 %
HCT: 43 % (ref 36.0–46.0)
Hemoglobin: 13.3 g/dL (ref 12.0–15.0)
Immature Granulocytes: 1 %
Lymphocytes Relative: 17 %
Lymphs Abs: 2.1 10*3/uL (ref 0.7–4.0)
MCH: 29.2 pg (ref 26.0–34.0)
MCHC: 30.9 g/dL (ref 30.0–36.0)
MCV: 94.5 fL (ref 80.0–100.0)
Monocytes Absolute: 0.6 10*3/uL (ref 0.1–1.0)
Monocytes Relative: 5 %
Neutro Abs: 9.3 10*3/uL — ABNORMAL HIGH (ref 1.7–7.7)
Neutrophils Relative %: 75 %
Platelets: 208 10*3/uL (ref 150–400)
RBC: 4.55 MIL/uL (ref 3.87–5.11)
RDW: 13.6 % (ref 11.5–15.5)
WBC: 12.4 10*3/uL — ABNORMAL HIGH (ref 4.0–10.5)
nRBC: 0 % (ref 0.0–0.2)

## 2021-08-18 LAB — PROTIME-INR
INR: 1.1 (ref 0.8–1.2)
Prothrombin Time: 14.4 seconds (ref 11.4–15.2)

## 2021-08-18 IMAGING — DX DG KNEE COMPLETE 4+V*R*
4 series · 4 of 4 positions shown · non-contrast
Comparison: [DATE]

CLINICAL DATA: Fell, hip fracture, pain

EXAM:
RIGHT KNEE - COMPLETE 4+ VIEW

[knee ap]
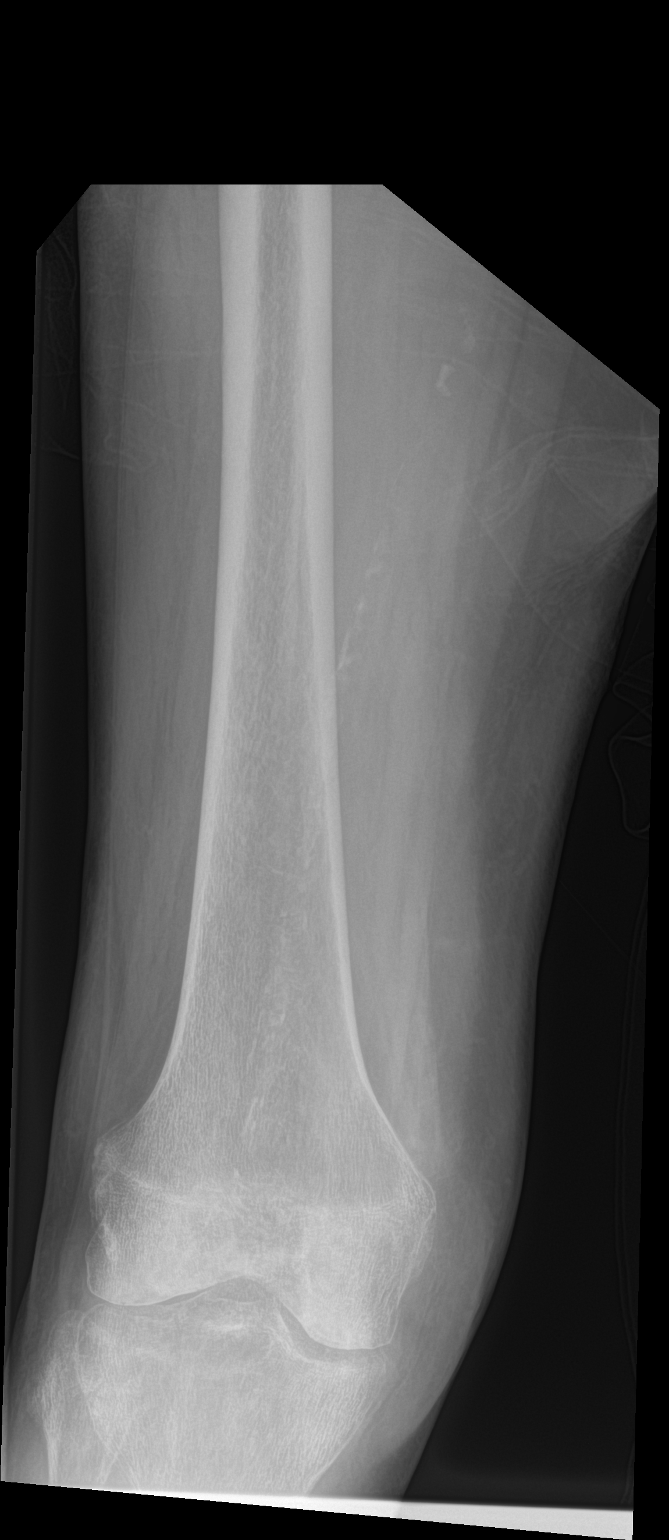

[knee lat]
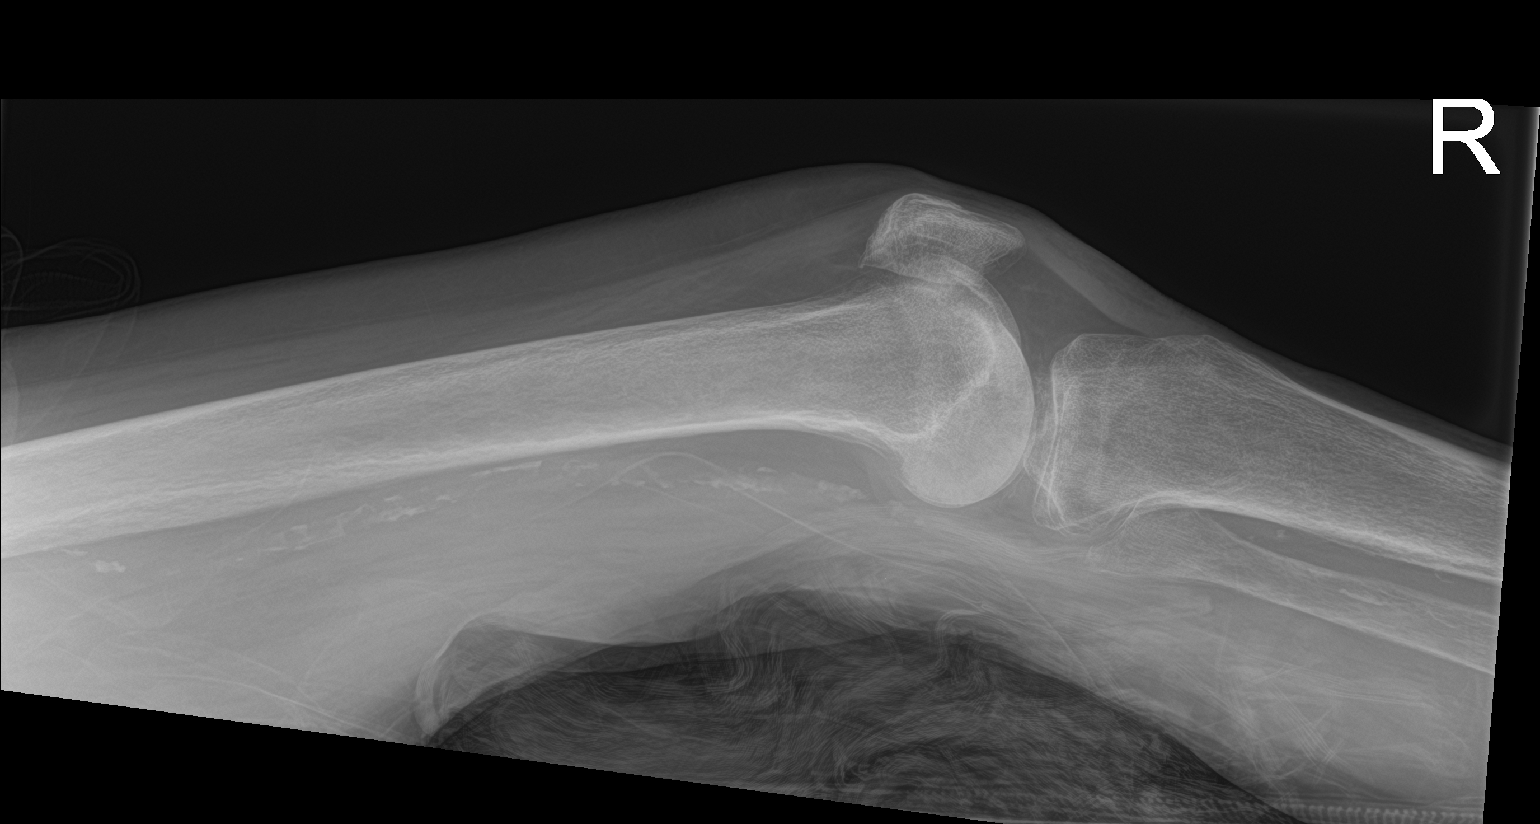

[knee obl (1 of 2)]
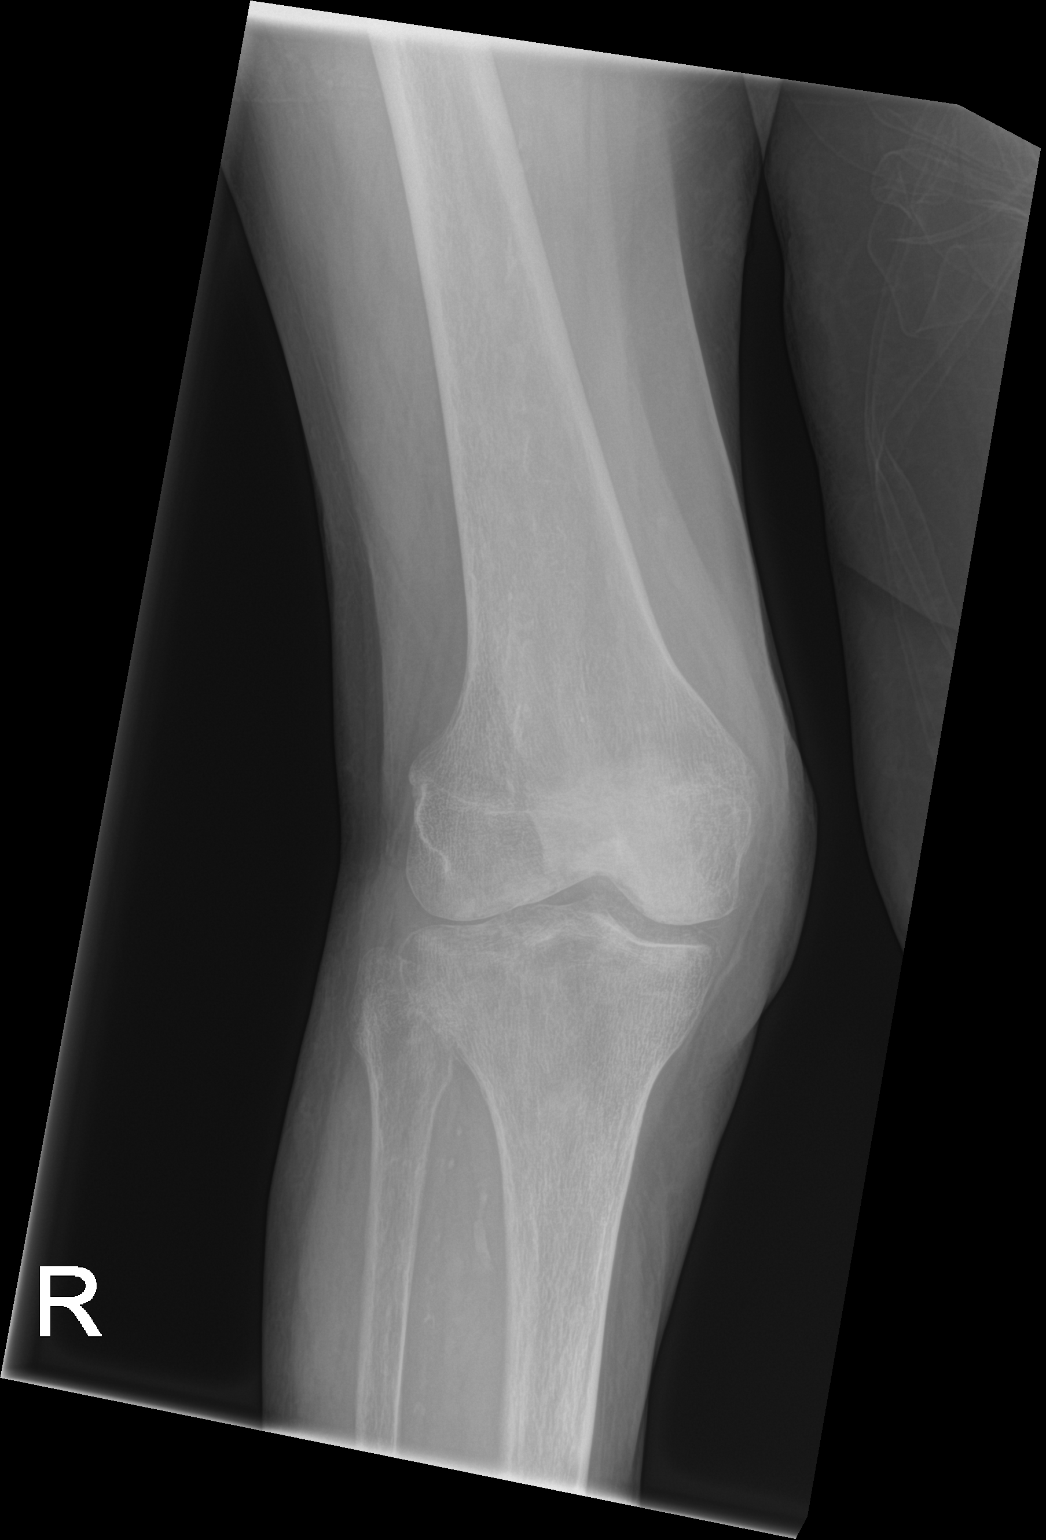

[knee obl (2 of 2)]
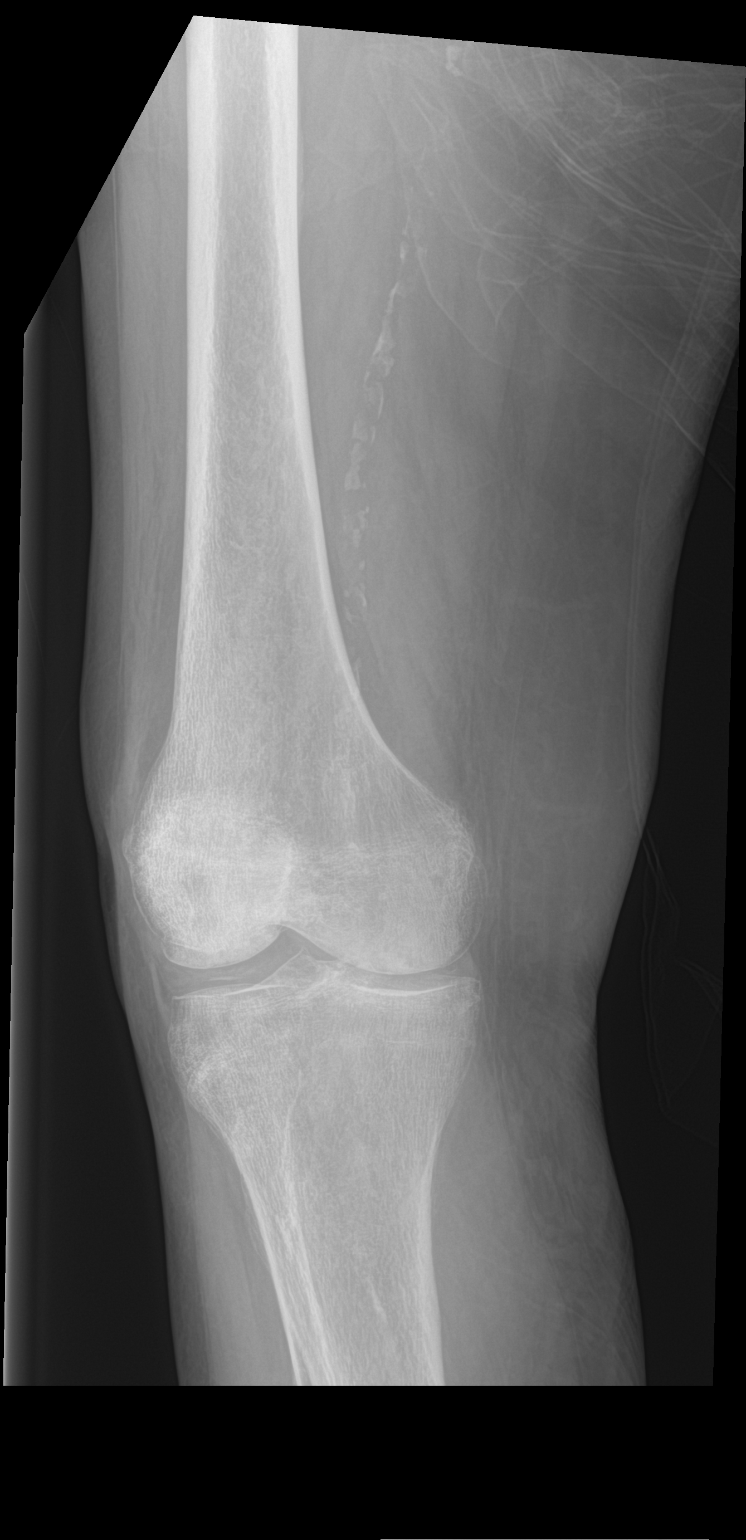

[4 of 4 positions shown; findings below may reference images not displayed]

FINDINGS: Frontal, bilateral oblique, and cross-table lateral views of the
right knee are obtained. No fracture, subluxation, or dislocation.
There is mild 3 compartmental osteoarthritis. No joint effusion.
Soft tissues are unremarkable.
IMPRESSION: 1. Mild osteoarthritis.  No acute fracture.

## 2021-08-18 IMAGING — CT CT HEAD W/O CM
3 series · 16 of 47 positions shown, 19 images · non-contrast
Comparison: None.

CLINICAL DATA: Head trauma.

EXAM:
CT HEAD WITHOUT CONTRAST
TECHNIQUE: Contiguous axial images were obtained from the base of the skull
through the vertex without intravenous contrast.

[Series 3: head 5.0 h30s · axial · 0.38mm/px · z∈[-5,+120]mm · 10 of 30 slices shown, 13 images]
[im 3/30  brain]
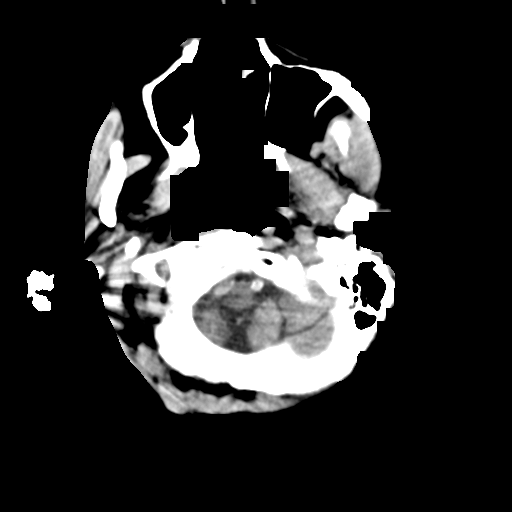
[im 3/30  bone]
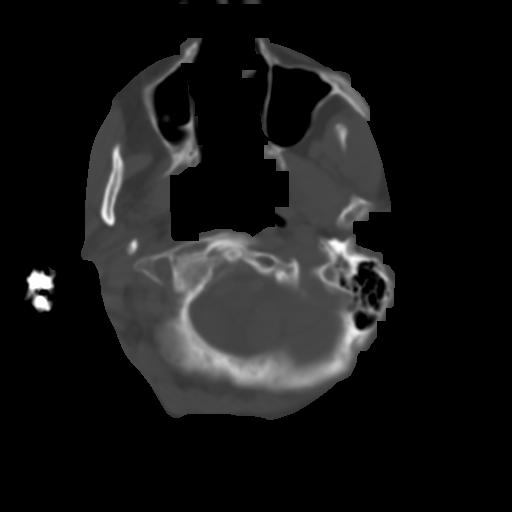
[im 6/30  brain]
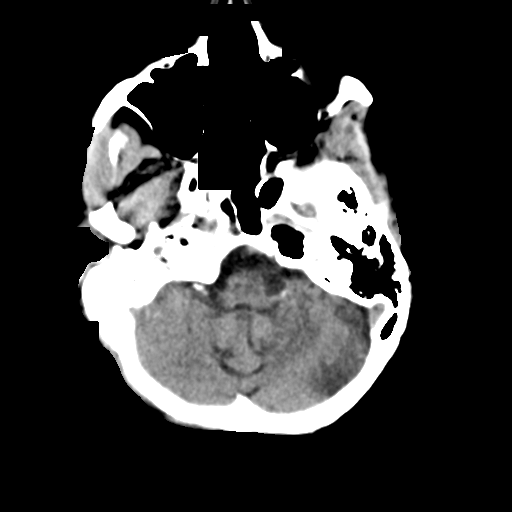
[im 9/30  brain]
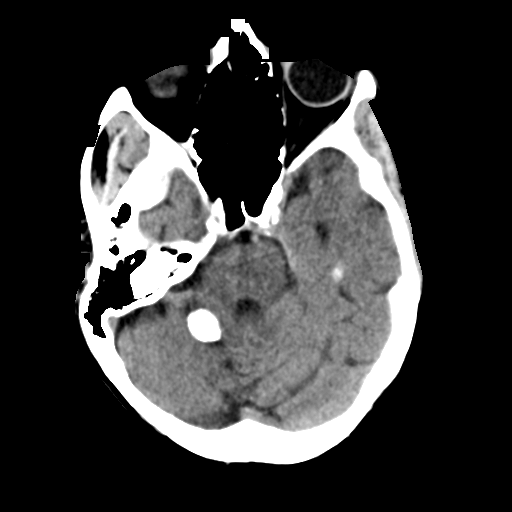
[im 11/30  brain]
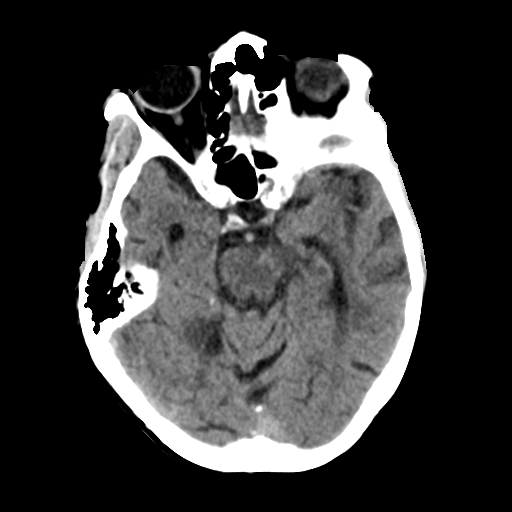
[im 14/30  brain]
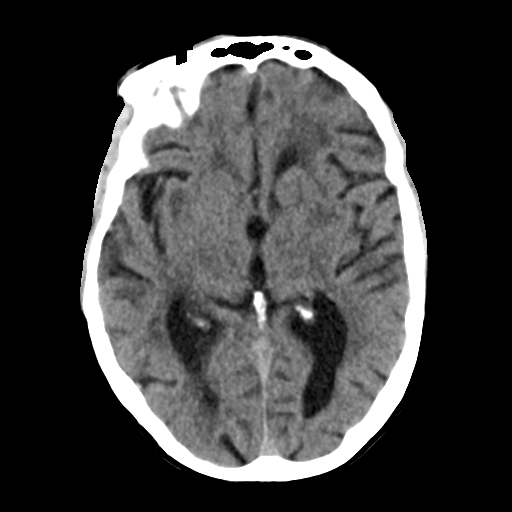
[im 14/30  bone]
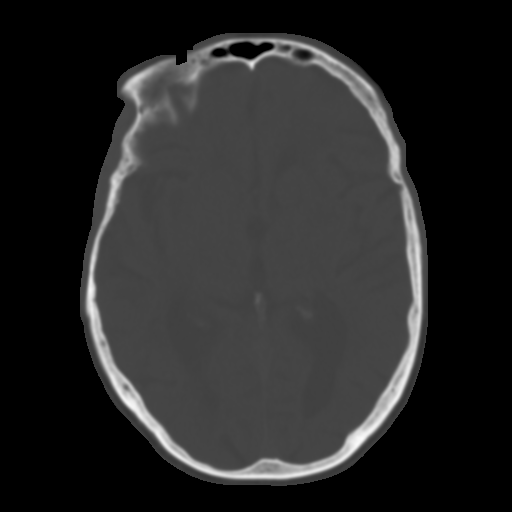
[im 17/30  brain]
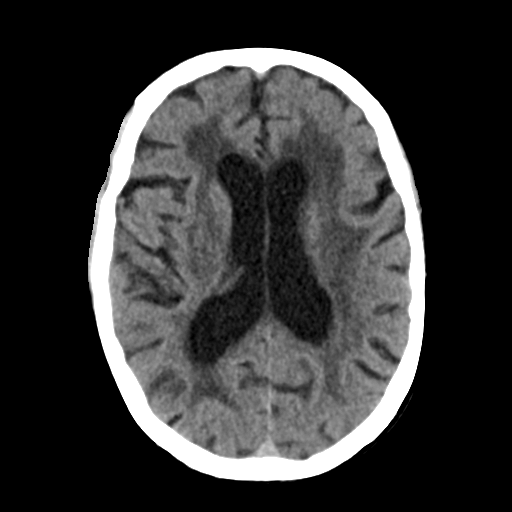
[im 20/30  brain]
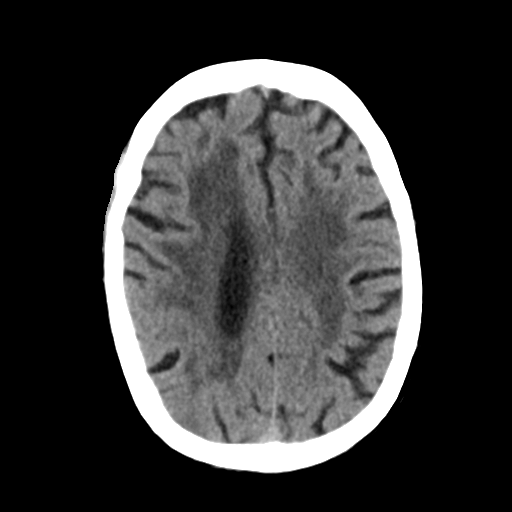
[im 23/30  brain]
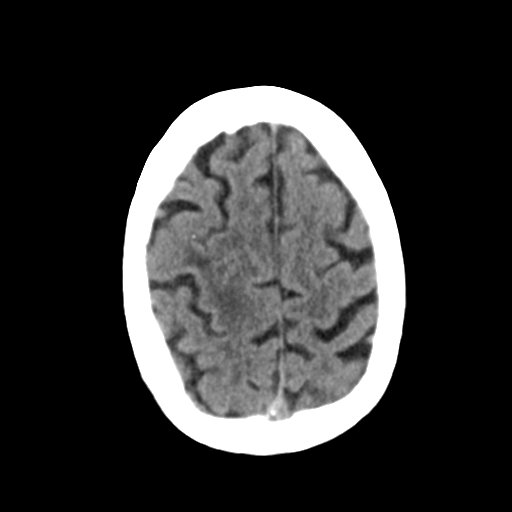
[im 25/30  brain]
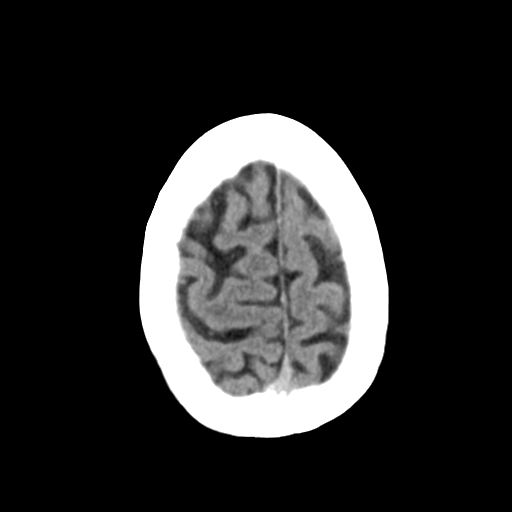
[im 25/30  bone]
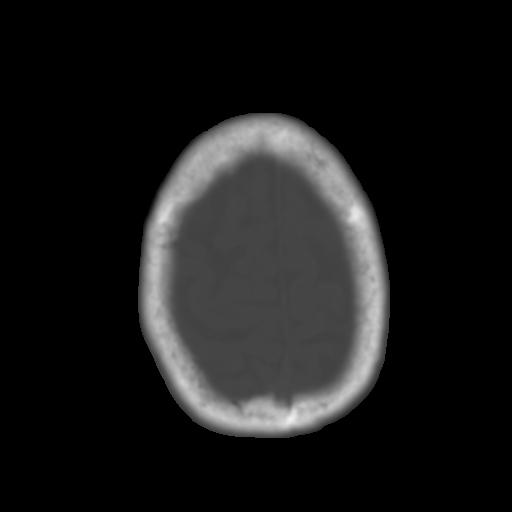
[im 28/30  brain]
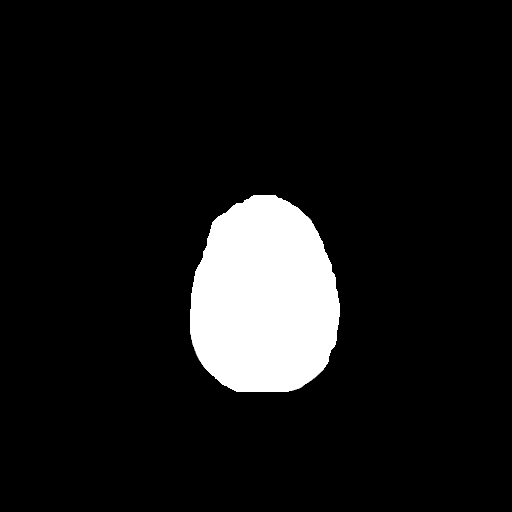

[Series 5: head 3.0 mpr cor · coronal · 0.29mm/px · 3 of 62 slices shown]
[im 21/62  brain]
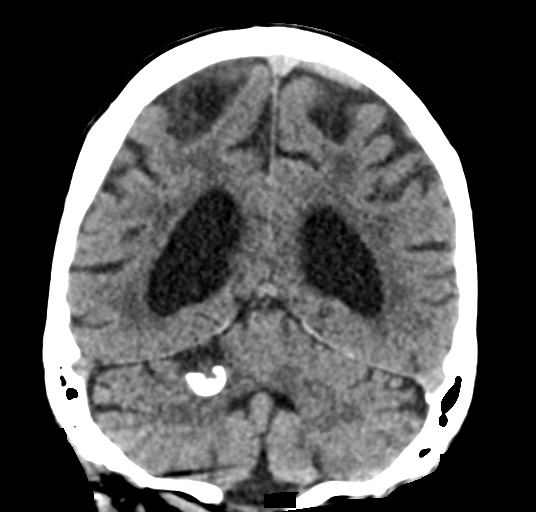
[im 28/62  brain]
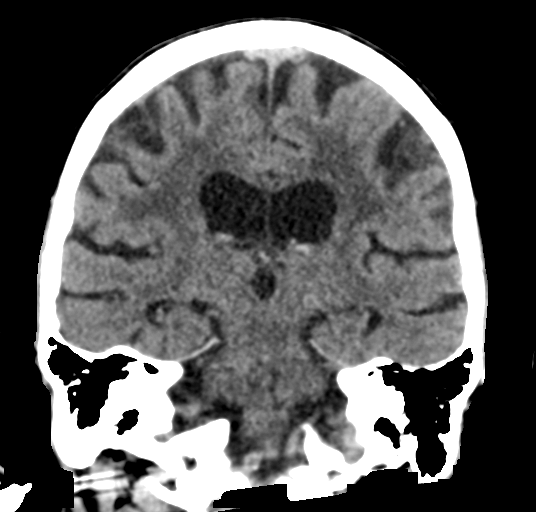
[im 34/62  brain]
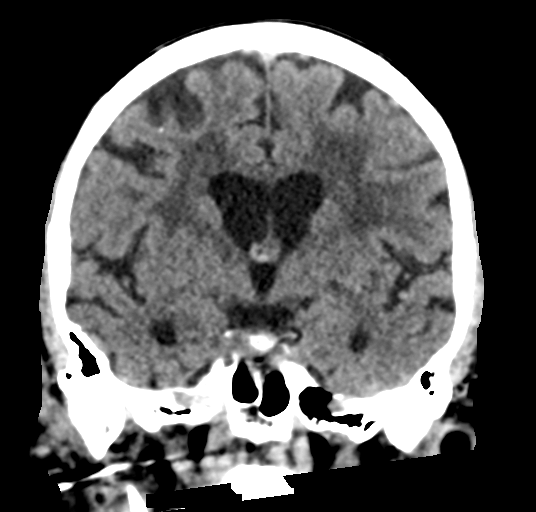

[Series 6: head 3.0 mpr sag · sagittal · 0.29mm/px · 3 of 52 slices shown]
[im 18/52  brain]
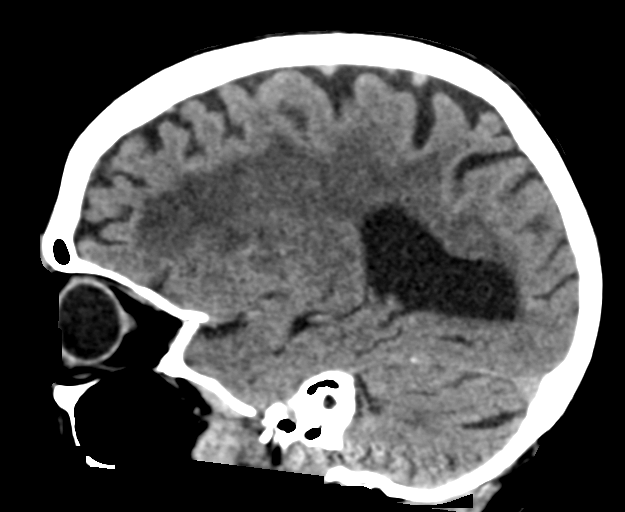
[im 26/52  brain]
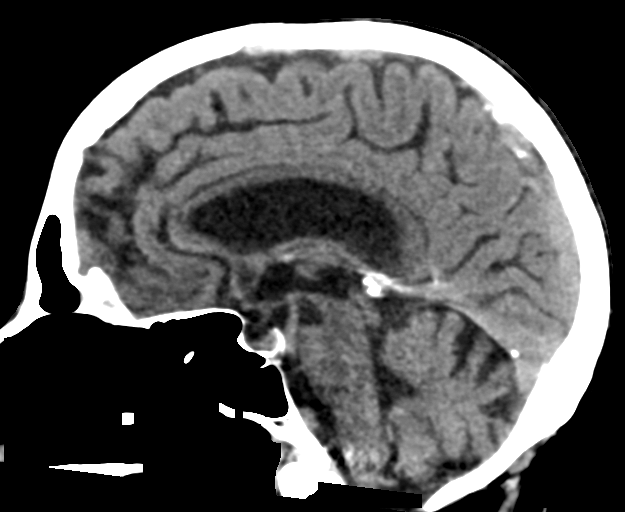
[im 35/52  brain]
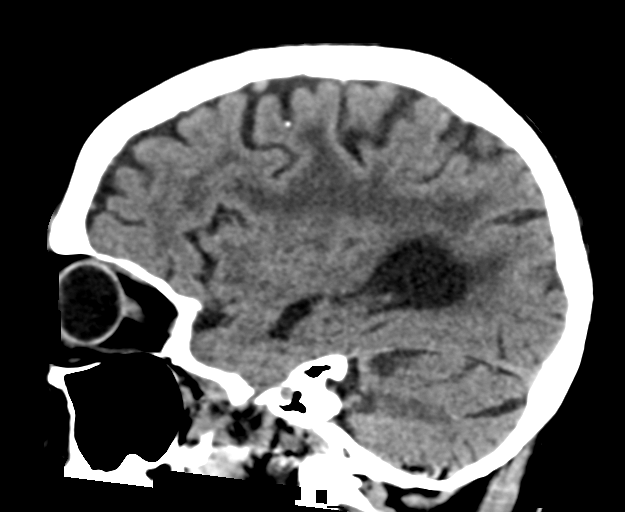

[16 of 47 positions shown; findings below may reference images not displayed]

FINDINGS: Brain: Moderate age-related atrophy and chronic microvascular
ischemic changes. Old area of infarct with associated dystrophic
calcification in the superior right cerebellum. There is no acute
intracranial hemorrhage. No mass effect or midline shift. No
extra-axial fluid collection.

Vascular: No hyperdense vessel or unexpected calcification.

Skull: Normal. Negative for fracture or focal lesion.

Sinuses/Orbits: No acute finding.

Other: None
IMPRESSION: 1. No acute intracranial pathology.
2. Moderate age-related atrophy and chronic microvascular ischemic
changes. Old right cerebellar infarct.

## 2021-08-18 IMAGING — CT CT HIP*R* W/O CM
2 of 4 series · 16 of 46 positions shown, 18 images · non-contrast
Comparison: Right hip radiographs [DATE]

CLINICAL DATA: Fall. Hip trauma with fracture suspected. Pain with
movement.

EXAM:
CT OF THE RIGHT HIP WITHOUT CONTRAST
TECHNIQUE: Multidetector CT imaging of the right hip was performed according to
the standard protocol. Multiplanar CT image reconstructions were
also generated.

[Series 6: pelvis thin · axial · 0.43mm/px · z∈[-672,-538]mm · 13 of 247 slices shown, 15 images]
[im 11/247  soft-tissue]
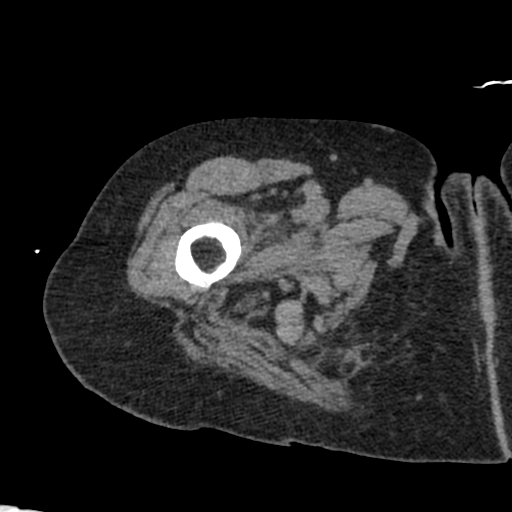
[im 11/247  bone]
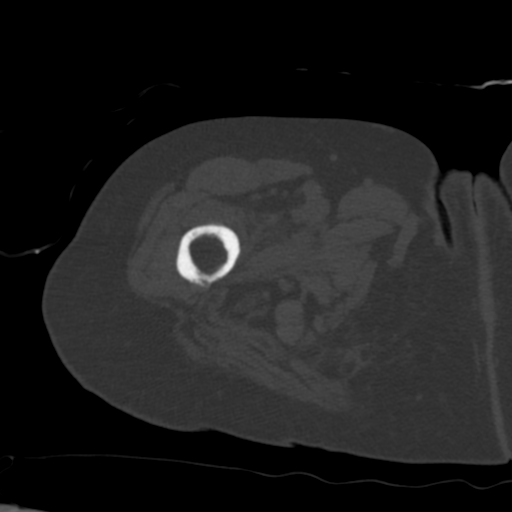
[im 31/247  soft-tissue]
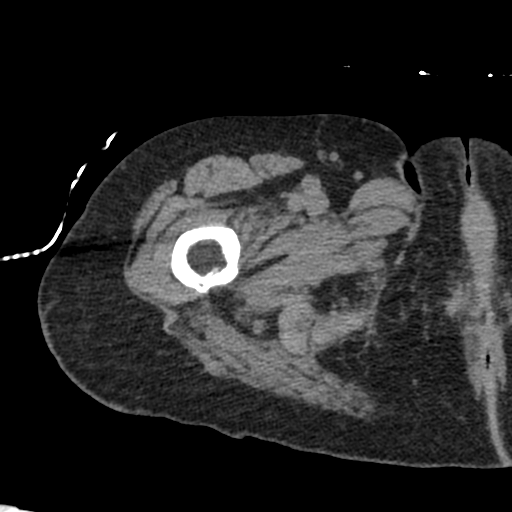
[im 52/247  soft-tissue]
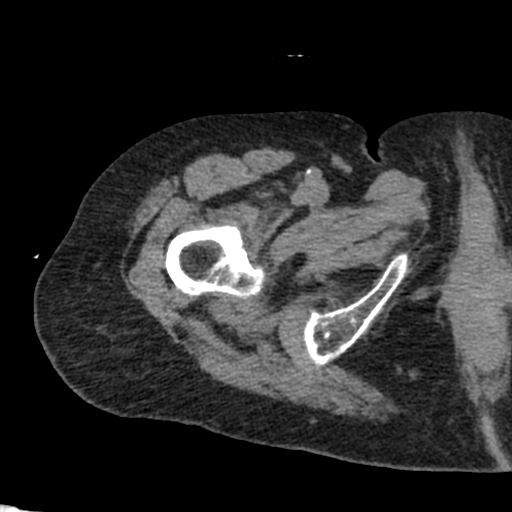
[im 72/247  soft-tissue]
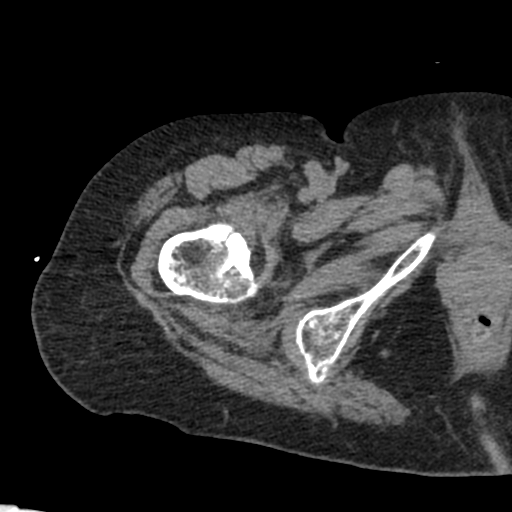
[im 83/247  soft-tissue]
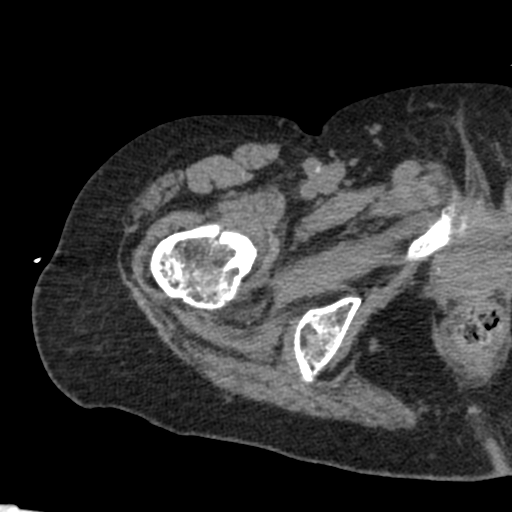
[im 103/247  soft-tissue]
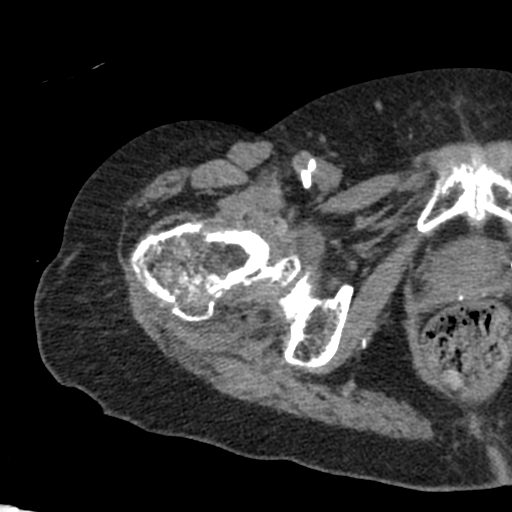
[im 124/247  soft-tissue]
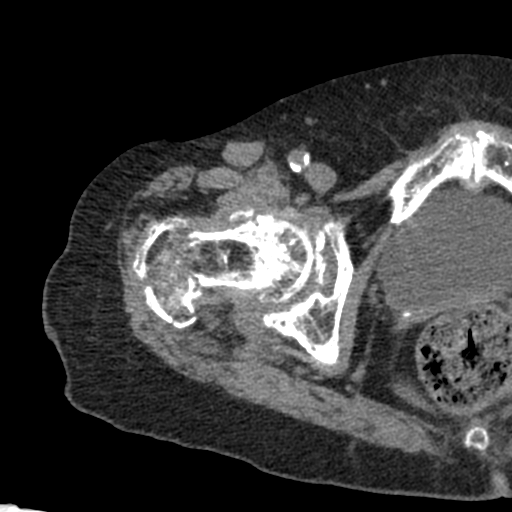
[im 144/247  soft-tissue]
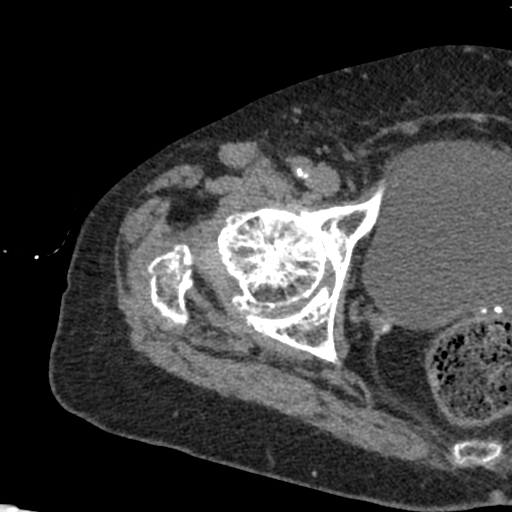
[im 165/247  soft-tissue]
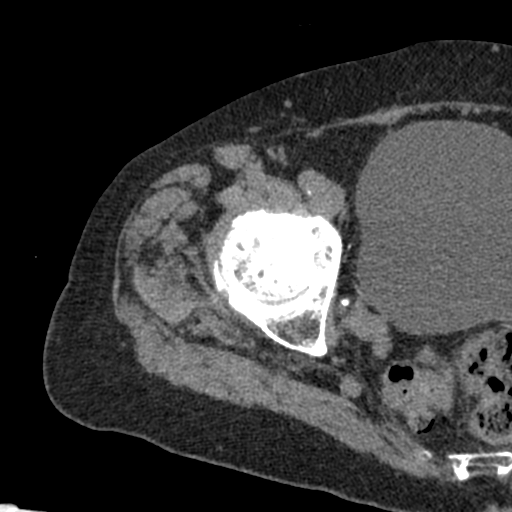
[im 165/247  bone]
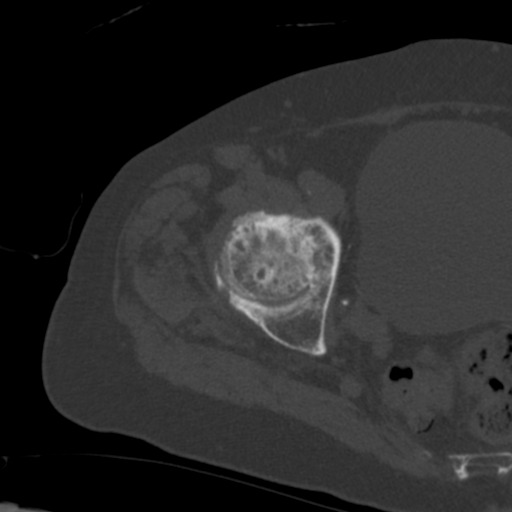
[im 175/247  soft-tissue]
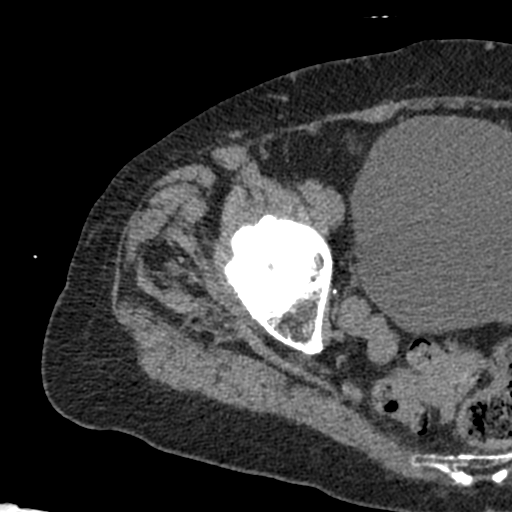
[im 195/247  soft-tissue]
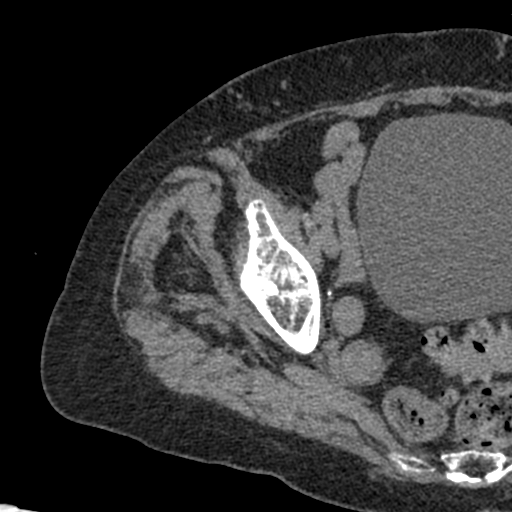
[im 216/247  soft-tissue]
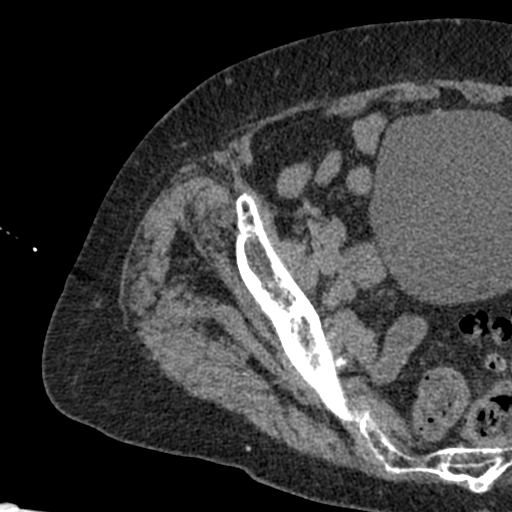
[im 236/247  soft-tissue]
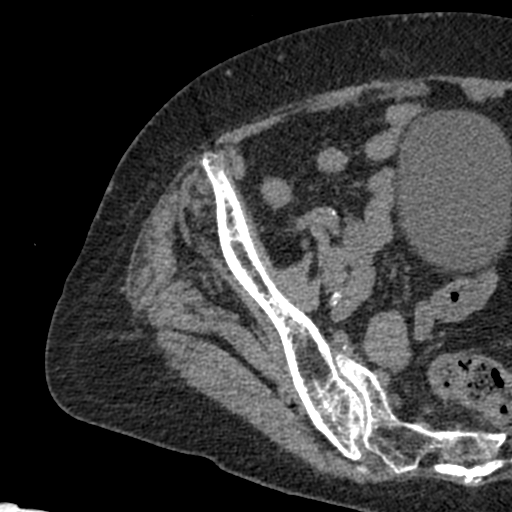

[Series 8: coronal st · coronal · 0.31mm/px · 3 of 101 slices shown]
[im 34/101  soft-tissue]
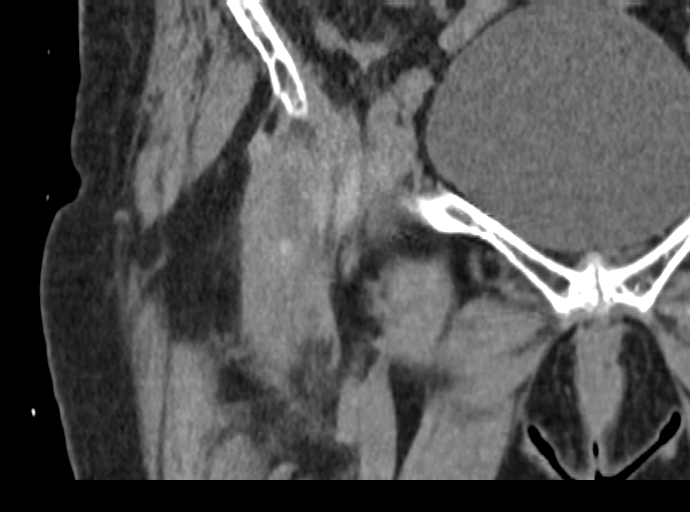
[im 45/101  soft-tissue]
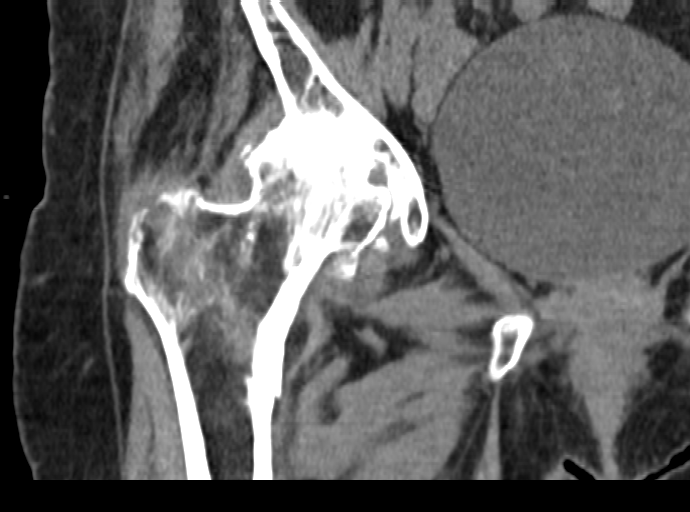
[im 56/101  soft-tissue]
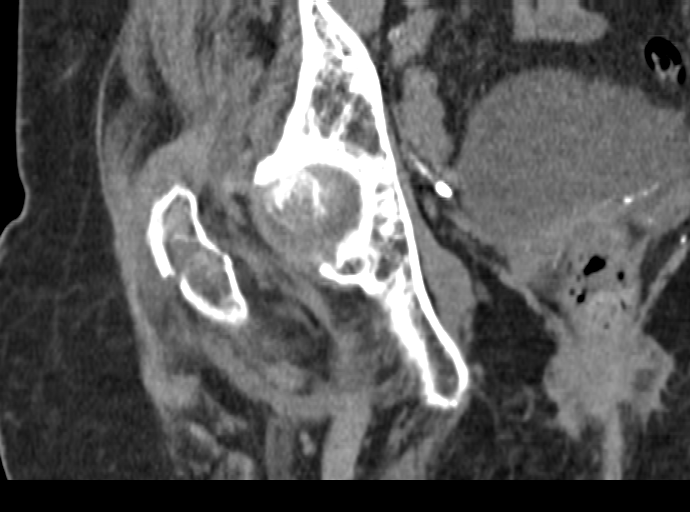

[16 of 46 positions shown; findings below may reference images not displayed]

FINDINGS: Bones/Joint/Cartilage

Diffuse bone demineralization. Comminuted inter trochanteric
fractures of the proximal right femur with fracture lines extending
through the trochanteric region to the greater and lesser
trochanters. Mild impaction without significant displacement of
fracture fragments. No additional fractures demonstrated in the
visualized acetabulum or right hemipelvis.

Severe degenerative changes in the right hip with joint space
narrowing, subcortical cyst formation, and prominent osteophyte
formation.

Ligaments

Suboptimally assessed by CT.

Muscles and Tendons

Fatty atrophy of the right hip musculature. No significant
intramuscular hematoma.

Soft tissues

Visualized pelvic organs appear intact.  Vascular calcifications.
IMPRESSION: Comminuted intertrochanteric fractures of the proximal right femur
without significant displacement. Severe degenerative changes in the
right hip without dislocation.

## 2021-08-18 IMAGING — DX DG CHEST 1V
1 series · 1 of 1 positions shown · non-contrast
Comparison: None.

CLINICAL DATA: Fell, hip fracture, preoperative evaluation

EXAM:
CHEST  1 VIEW

[chest ap]
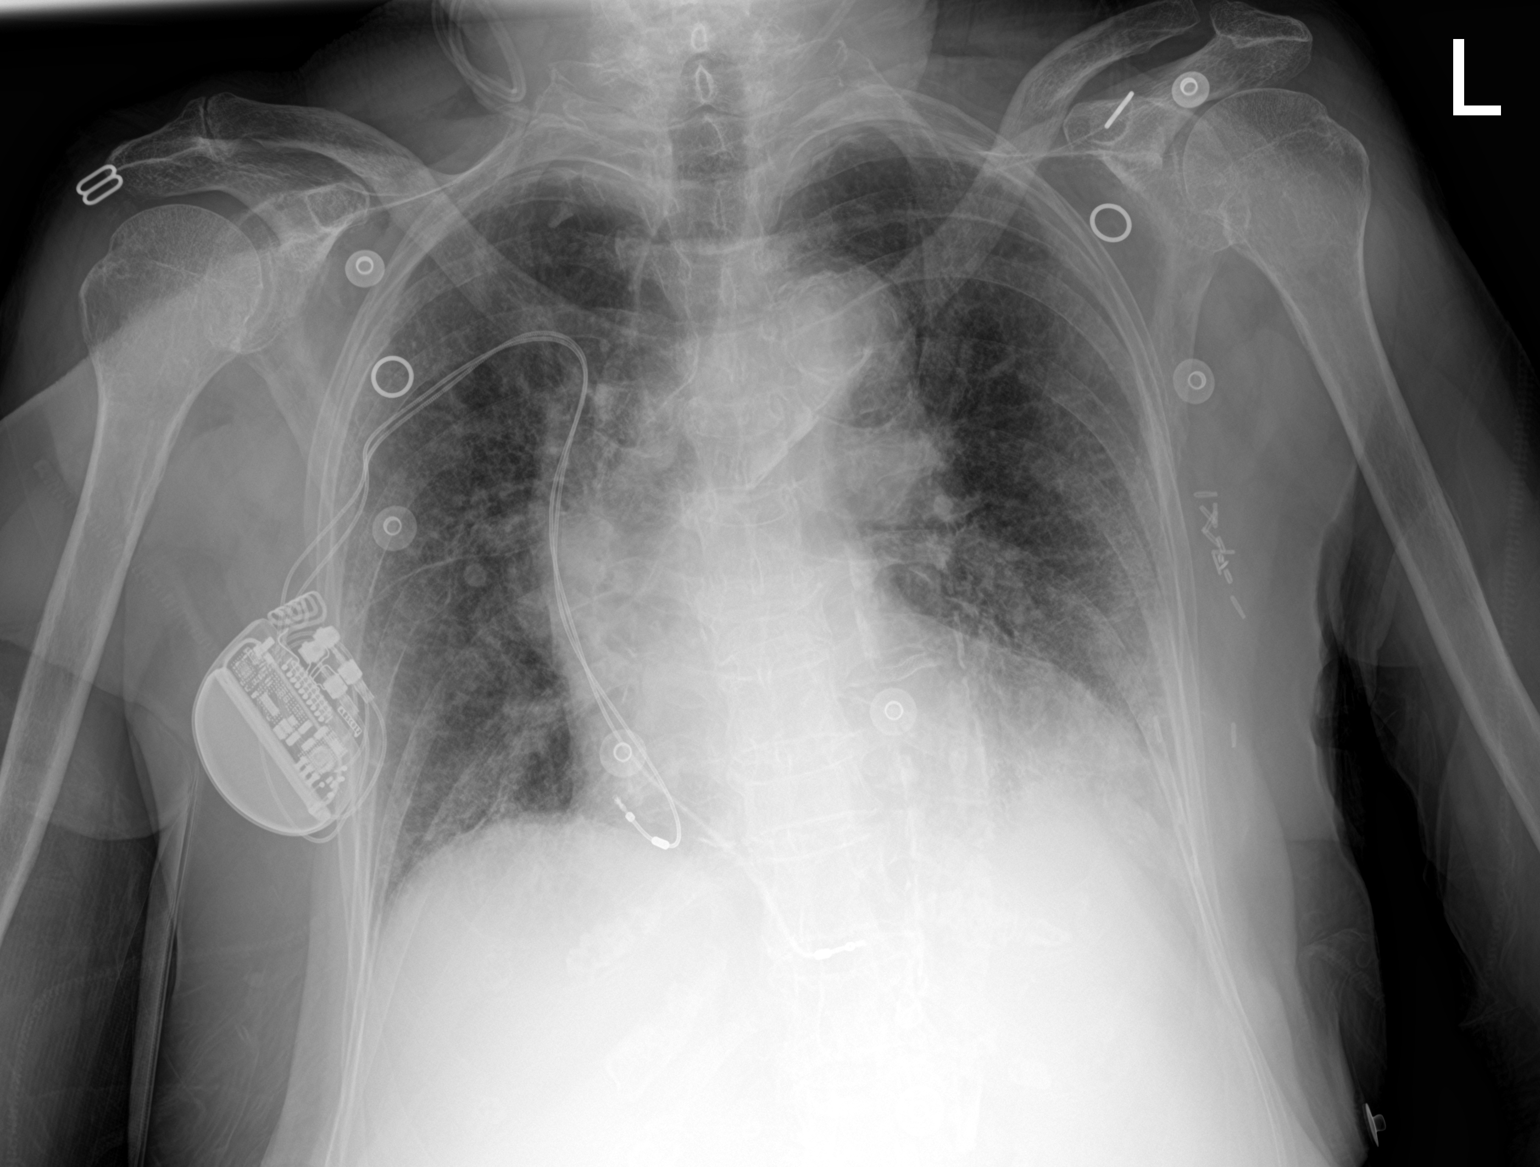

[1 of 1 positions shown; findings below may reference images not displayed]

FINDINGS: Single frontal view of the chest demonstrates dual lead pacer
overlying right chest. Cardiac silhouette is enlarged. Marked
ectasia and atherosclerosis of the thoracic aorta. Mild central
vascular congestion. Diffuse interstitial prominence could reflect
interstitial edema or chronic lung scarring. No acute airspace
disease, effusion, or pneumothorax.
IMPRESSION: 1. Central vascular congestion and diffuse interstitial prominence,
consistent with interstitial edema or chronic scarring. No acute
airspace disease.

## 2021-08-18 IMAGING — DX DG HIP (WITH OR WITHOUT PELVIS) 2-3V*R*
3 series · 3 of 3 positions shown · non-contrast
Comparison: None.

CLINICAL DATA: Fall with hip pain

EXAM:
DG HIP (WITH OR WITHOUT PELVIS) 2-3V RIGHT

[pelvis ap]
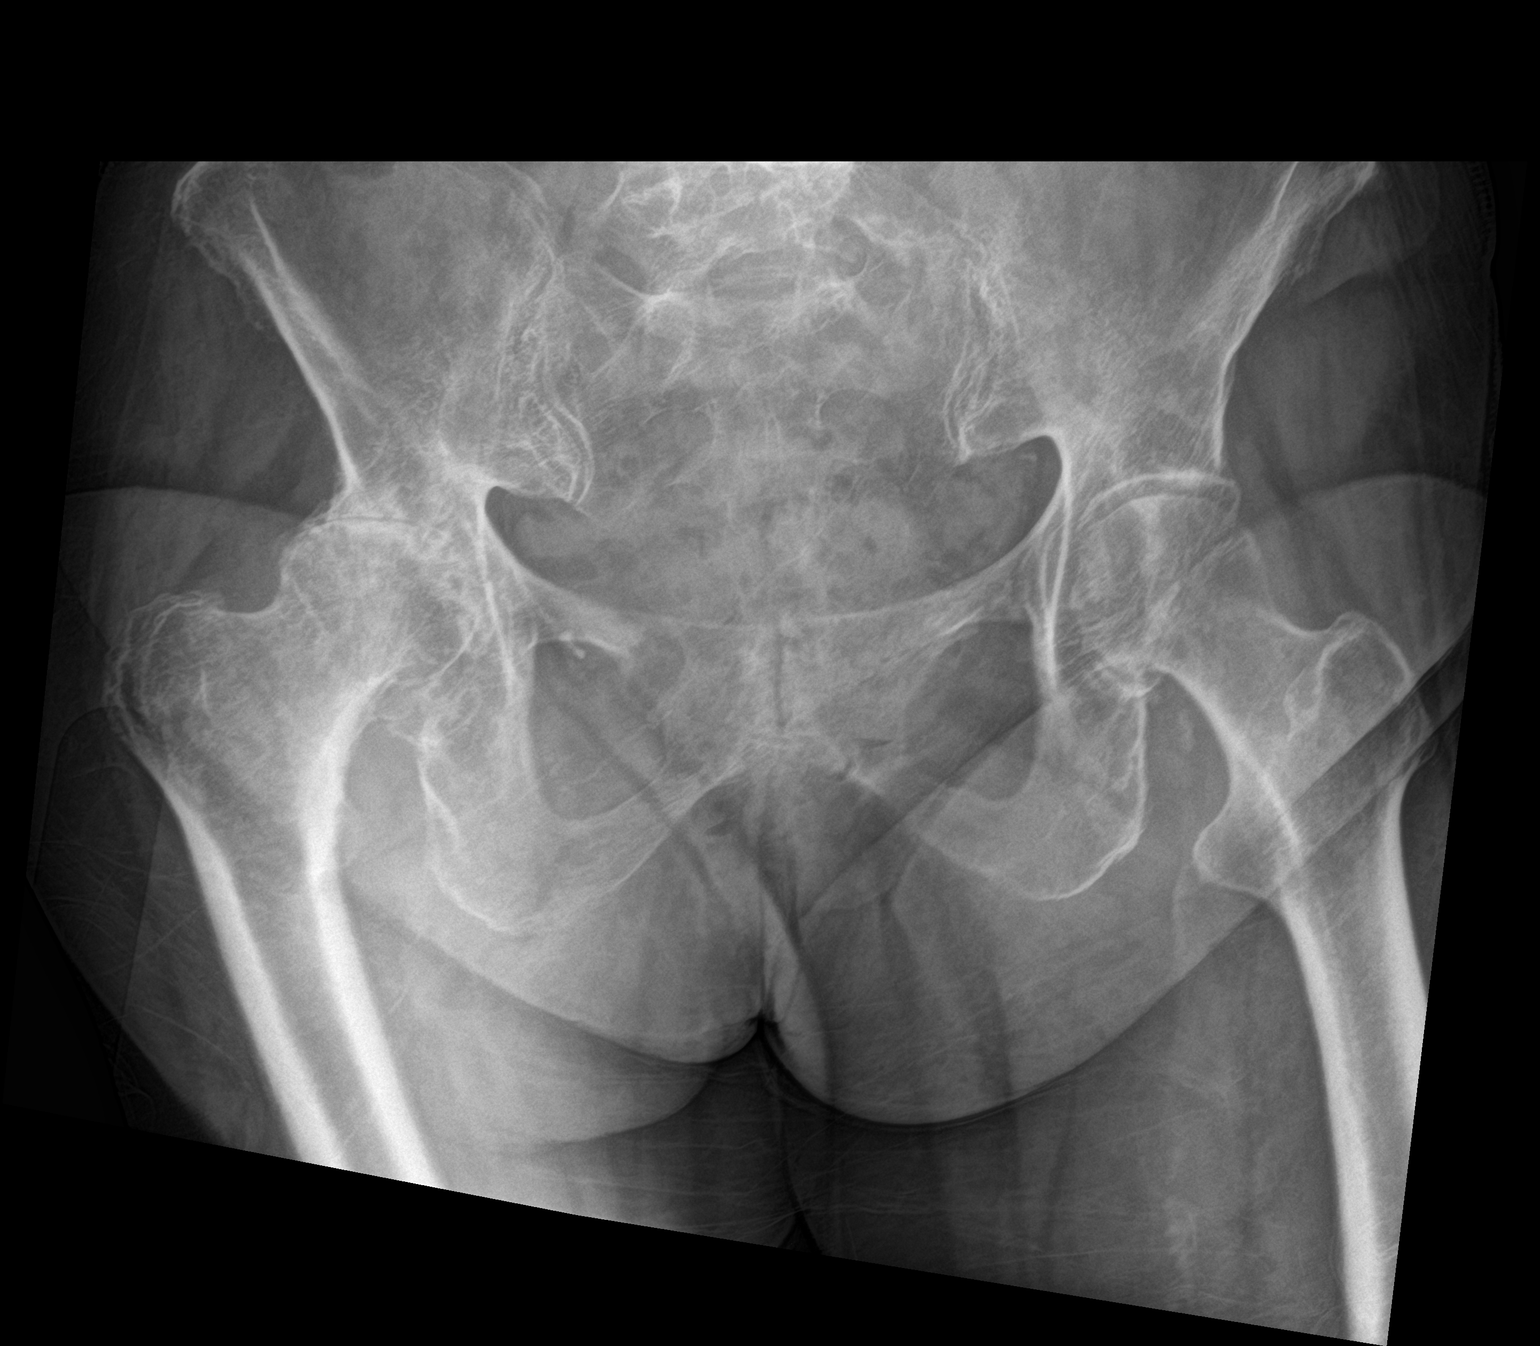

[hip ap]
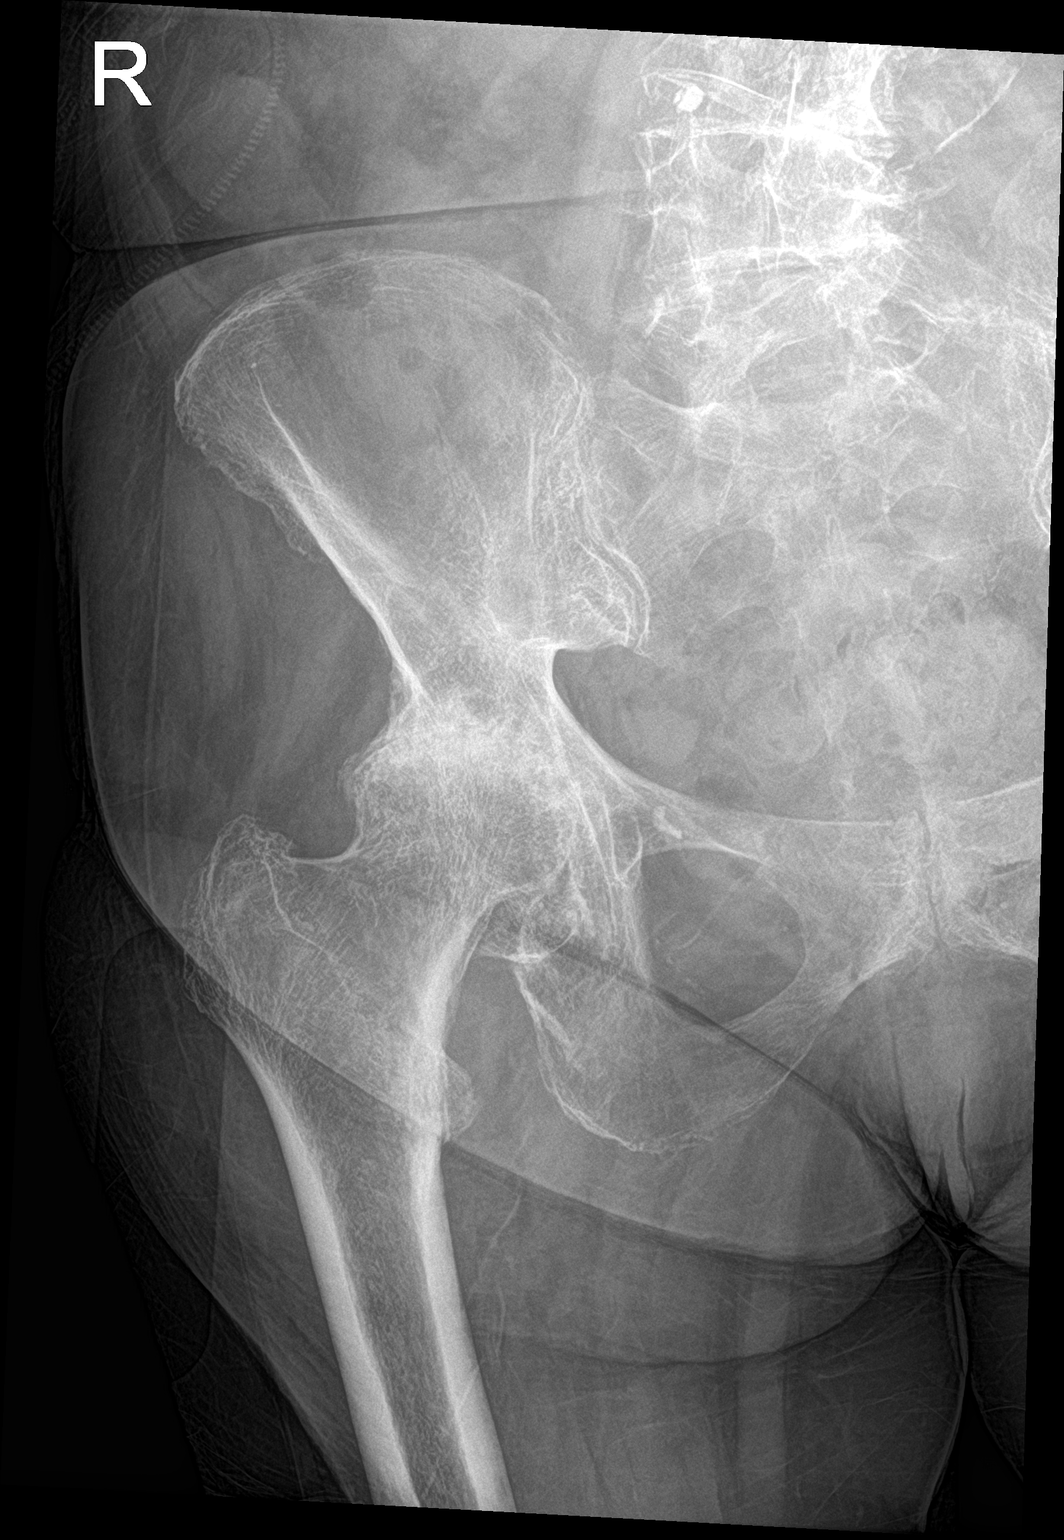

[hip x-table]
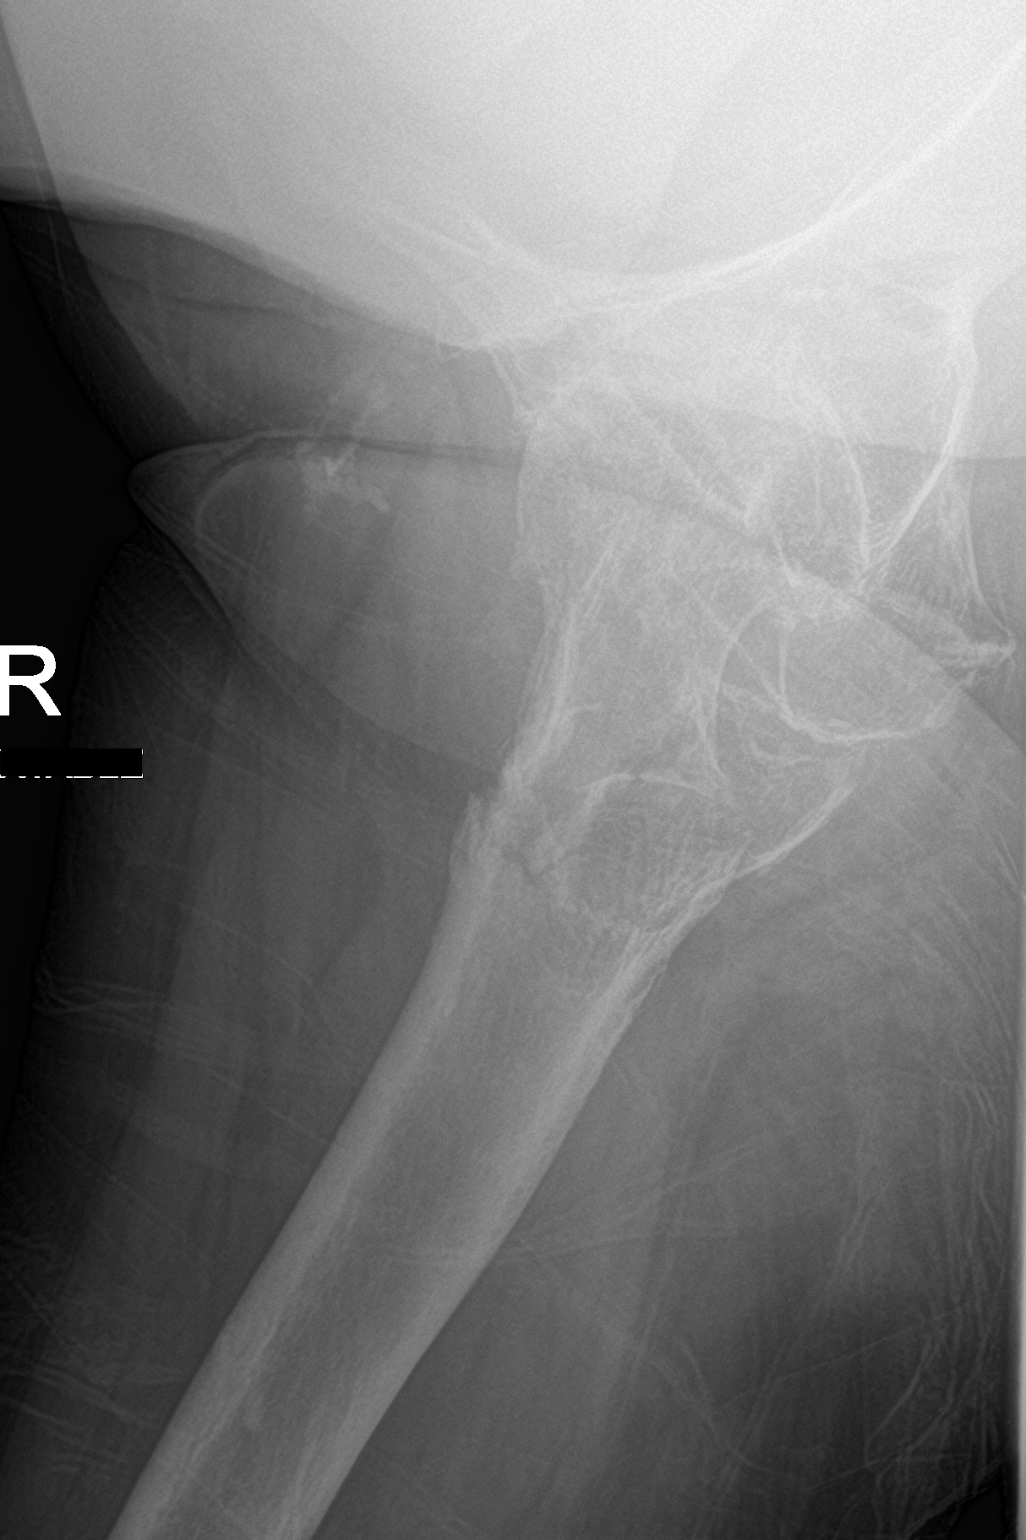

[3 of 3 positions shown; findings below may reference images not displayed]

FINDINGS: SI joints are non widened. Pubic symphysis and rami appear intact.
Acute nondisplaced fracture on frog-leg view, appears to involve the
lower trochanter and possibly the proximal shaft. Severe right hip
arthritis with bone on bone appearance, sclerosis and subarticular
cysts.
IMPRESSION: Acute nondisplaced right proximal femoral fracture appears to
involve the lower trochanter and possibly the proximal shaft, CT
could be obtained for more thorough fracture evaluation.

## 2021-08-18 MED ORDER — HYDROCODONE-ACETAMINOPHEN 5-325 MG PO TABS
1.0000 | ORAL_TABLET | Freq: Four times a day (QID) | ORAL | Status: DC | PRN
  Administered 2021-08-19: 01:00:00 1 via ORAL
  Filled 2021-08-18 (×2): qty 1

## 2021-08-18 MED ORDER — POLYETHYLENE GLYCOL 3350 17 G PO PACK: 17.0000 g | PACK | Freq: Every day | ORAL | Status: DC | PRN

## 2021-08-18 MED ORDER — LEVOTHYROXINE SODIUM 25 MCG PO TABS
125.0000 ug | ORAL_TABLET | Freq: Every day | ORAL | Status: DC
  Administered 2021-08-21 – 2021-08-23 (×3): 125 ug via ORAL
  Filled 2021-08-18 (×3): qty 1

## 2021-08-18 MED ORDER — INSULIN ASPART 100 UNIT/ML IJ SOLN
0.0000 [IU] | Freq: Three times a day (TID) | INTRAMUSCULAR | Status: DC
  Administered 2021-08-19: 18:00:00 1 [IU] via SUBCUTANEOUS
  Administered 2021-08-20: 18:00:00 2 [IU] via SUBCUTANEOUS
  Administered 2021-08-20 – 2021-08-22 (×4): 1 [IU] via SUBCUTANEOUS

## 2021-08-18 MED ORDER — ONDANSETRON HCL 4 MG/2ML IJ SOLN
INTRAMUSCULAR | Status: AC
  Filled 2021-08-18: qty 2

## 2021-08-18 MED ORDER — ONDANSETRON HCL 4 MG/2ML IJ SOLN
4.0000 mg | Freq: Once | INTRAMUSCULAR | Status: AC
  Administered 2021-08-18: 21:00:00 4 mg via INTRAVENOUS

## 2021-08-18 MED ORDER — FENTANYL CITRATE PF 50 MCG/ML IJ SOSY
50.0000 ug | PREFILLED_SYRINGE | INTRAMUSCULAR | Status: DC | PRN
  Administered 2021-08-18: 22:00:00 50 ug via INTRAVENOUS
  Filled 2021-08-18: qty 1

## 2021-08-18 MED ORDER — PANTOPRAZOLE SODIUM 40 MG PO TBEC
40.0000 mg | DELAYED_RELEASE_TABLET | Freq: Every day | ORAL | Status: DC
  Administered 2021-08-21 – 2021-08-23 (×3): 40 mg via ORAL
  Filled 2021-08-18 (×3): qty 1

## 2021-08-18 MED ORDER — SODIUM CHLORIDE 0.9 % IV SOLN
12.5000 mg | Freq: Once | INTRAVENOUS | Status: DC
  Filled 2021-08-18: qty 0.5

## 2021-08-18 MED ORDER — HYDROMORPHONE HCL 1 MG/ML IJ SOLN
0.5000 mg | INTRAMUSCULAR | Status: DC | PRN
  Administered 2021-08-19 – 2021-08-23 (×9): 0.5 mg via INTRAVENOUS
  Filled 2021-08-18 (×3): qty 0.5
  Filled 2021-08-18 (×2): qty 1
  Filled 2021-08-18 (×5): qty 0.5

## 2021-08-18 MED ORDER — METOPROLOL SUCCINATE ER 25 MG PO TB24
25.0000 mg | ORAL_TABLET | Freq: Every day | ORAL | Status: DC
  Administered 2021-08-20 – 2021-08-23 (×3): 25 mg via ORAL
  Filled 2021-08-18 (×4): qty 1

## 2021-08-18 NOTE — ED Provider Notes (Signed)
[facility] EMERGENCY DEPARTMENT Provider Note   CSN: [id number] Arrival date & time: 08/18/21  2038     History Chief Complaint  Patient presents with   Monica Murillo is a 87 y.o. female.  Patient from independent living, history of heart block status post pacemaker, chronic respiratory failure in part due to pulmonary fibrosis on home oxygen, diabetes, hypertension, anticoagulation on Xarelto --presents to the emergency department today after a fall.  Patient fell while trying to sit back into her wheelchair.  She fell onto her right hip and has significant pain in that area.  Pain is worse with movement.  She has had nausea and vomiting.  She was given 100 mcg of fentanyl and 4 mg of Zofran by EMS.  She continues to vomit on arrival.  Patient has a small abrasion over her right eyebrow.  Denies other complaints.  No associated chest pain or shortness of breath.  No abdominal pain.  No numbness or tingling in the legs.      Past Medical History:  Diagnosis Date   Anemia    Heart block    History of blood transfusion    History of heart artery stent    Hypertension    Hypothyroid    Pacemaker    Pulmonary fibrosis (HCC)    Respiratory failure, chronic ([place])    Type 2 diabetes mellitus ([place])     Patient Active Problem List   Diagnosis Date Noted   Complete heart block ([place]) 07/30/2021   Pulmonary fibrosis ([place]) 07/30/2021   Type 2 diabetes mellitus with hyperglycemia, without long-term current use of insulin ([place]) 07/30/2021   Hypothyroidism 07/30/2021   Gastroesophageal reflux disease without esophagitis 07/30/2021   Macular degeneration of both eyes 07/30/2021   Chronic right hip pain 07/30/2021   Memory impairment 07/30/2021   Past heart attack 07/30/2021    Past Surgical History:  Procedure Laterality Date   ABDOMINAL HYSTERECTOMY  1981   BREAST LUMPECTOMY  2011   CARDIAC PACEMAKER PLACEMENT  2018   CORONARY STENT PLACEMENT  2012      OB History   No obstetric history on file.     Family History  Problem Relation Age of Onset   Other Mother        Enlarged Heart   Dementia Sister     Social History   Tobacco Use   Smoking status: Former    Packs/day: 1.00    Years: 50.00    Pack years: 50.00    Types: Cigarettes   Smokeless tobacco: Never  Vaping Use   Vaping Use: Never used  Substance Use Topics   Alcohol use: Yes    Comment: 2-3 times a week   Drug use: Never    Home Medications Prior to Admission medications   Medication Sig Start Date End Date Taking? Authorizing Provider  Acetaminophen (TYLENOL 8 HOUR ARTHRITIS PAIN PO) Take 600 mg by mouth in the morning and at bedtime.    [provider]  Esomeprazole Magnesium (NEXIUM PO) Take by mouth.    [provider]  levothyroxine (SYNTHROID) 125 MCG tablet Take 125 mcg by mouth daily.    [provider]  Lutein-Zeaxanthin 20-1 MG CAPS Take by mouth.    [provider]  METOPROLOL SUCCINATE PO Take 25 mg by mouth daily.    [provider]  OXYGEN Inhale into the lungs. 2 Liters    [provider]  rivaroxaban (XARELTO) 10  MG TABS tablet Take 1 tablet (10 mg total) by mouth daily. 08/03/21   Tobb, [name], DO    Allergies    Epinephrine, Lidocaine-epinephrine, and Sulfa antibiotics  Review of Systems   Review of Systems  Constitutional:  Negative for fever.  HENT:  Negative for rhinorrhea and sore throat.   Eyes:  Negative for redness.  Respiratory:  Negative for cough.   Cardiovascular:  Negative for chest pain.  Gastrointestinal:  Positive for nausea and vomiting. Negative for abdominal pain and diarrhea.  Genitourinary:  Negative for dysuria, frequency, hematuria and urgency.  Musculoskeletal:  Positive for arthralgias and myalgias.  Skin:  Positive for wound. Negative for rash.  Neurological:  Negative for headaches.   Physical Exam Updated Vital Signs BP (!) 208/80 (BP Location:  Right Arm)    Pulse 60    Temp 97.6 F (36.4 C) (Oral)    Resp 18    SpO2 100% Comment: 2LPM  Physical Exam Vitals and nursing note reviewed.  Constitutional:      General: She is not in acute distress.    Appearance: She is well-developed.  HENT:     Head: Normocephalic and atraumatic.     Right Ear: External ear normal.     Left Ear: External ear normal.     Nose: Nose normal.  Eyes:     Conjunctiva/sclera: Conjunctivae normal.  Cardiovascular:     Rate and Rhythm: Normal rate and regular rhythm.     Heart sounds: No murmur heard. Pulmonary:     Effort: No respiratory distress.     Breath sounds: No wheezing, rhonchi or rales.     Comments: Nasal cannula in place Abdominal:     Palpations: Abdomen is soft.     Tenderness: There is no abdominal tenderness. There is no guarding or rebound.  Musculoskeletal:     Cervical back: Normal range of motion and neck supple.     Right lower leg: No edema.     Left lower leg: No edema.     Comments: Lower extremity varicosities.  Patient winces in pain when I attempted to flex her left hip.  No obvious shortening or rotation of the right leg.  No signs of trauma to the knees or ankles.  Pedal pulses intact.  Skin:    General: Skin is warm and dry.     Findings: No rash.  Neurological:     General: No focal deficit present.     Mental Status: She is alert. Mental status is at baseline.     Motor: No weakness.  Psychiatric:        Mood and Affect: Mood normal.    ED Results / Procedures / Treatments   Labs (all labs ordered are listed, but only abnormal results are displayed) Labs Reviewed  CBC WITH DIFFERENTIAL/PLATELET - Abnormal; Notable for the following components:      Result Value   WBC 12.4 (*)    Neutro Abs 9.3 (*)    Abs Immature Granulocytes 0.13 (*)    All other components within normal limits  COMPREHENSIVE METABOLIC PANEL - Abnormal; Notable for the following components:   CO2 20 (*)    Glucose, Bld 196 (*)     Calcium 8.8 (*)    Total Protein 6.1 (*)    All other components within normal limits  RESP PANEL BY RT-PCR (FLU A&B, COVID) ARPGX2  PROTIME-INR  TYPE AND SCREEN  ABO/RH    EKG EKG Interpretation  Date/Time:  Wednesday  August 18 2021 21:43:20 EST Ventricular Rate:  66 PR Interval:  270 QRS Duration: 88 QT Interval:  458 QTC Calculation: 480 R Axis:   -48 Text Interpretation: Atrial-paced rhythm with prolonged AV conduction with frequent AV dual-paced complexes Pulmonary disease pattern Left anterior fascicular block Moderate voltage criteria for LVH, may be normal variant ( R in aVL , Cornell product ) Abnormal ECG No previous ECGs available No previous ECGs available Confirmed by Fredia Sorrow [phone]) on 08/18/2021 10:17:42 PM  Radiology DG Hip Unilat With Pelvis 2-3 Views Right  Result Date: 08/18/2021 CLINICAL DATA:  Fall with hip pain EXAM: DG HIP (WITH OR WITHOUT PELVIS) 2-3V RIGHT COMPARISON:  None. FINDINGS: SI joints are non widened. Pubic symphysis and rami appear intact. Acute nondisplaced fracture on frog-leg view, appears to involve the lower trochanter and possibly the proximal shaft. Severe right hip arthritis with bone on bone appearance, sclerosis and subarticular cysts. IMPRESSION: Acute nondisplaced right proximal femoral fracture appears to involve the lower trochanter and possibly the proximal shaft, CT could be obtained for more thorough fracture evaluation. Electronically Signed   By: Donavan Foil M.D.   On: 08/18/2021 21:41    Procedures Procedures   Medications Ordered in ED Medications  promethazine (PHENERGAN) 12.5 mg in sodium chloride 0.9 % 50 mL IVPB (has no administration in time range)  metoprolol succinate (TOPROL-XL) 24 hr tablet 25 mg (has no administration in time range)  levothyroxine (SYNTHROID) tablet 125 mcg (has no administration in time range)  pantoprazole (PROTONIX) EC tablet 40 mg (has no administration in time range)   HYDROcodone-acetaminophen (NORCO/VICODIN) 5-325 MG per tablet 1 tablet (has no administration in time range)  HYDROmorphone (DILAUDID) injection 0.5 mg (has no administration in time range)  polyethylene glycol (MIRALAX / GLYCOLAX) packet 17 g (has no administration in time range)  ondansetron (ZOFRAN) injection 4 mg (4 mg Intravenous Given 08/18/21 2107)  ondansetron (ZOFRAN) 4 MG/2ML injection (  Given 08/18/21 2129)    ED Course  I have reviewed the triage vital signs and the nursing notes.  Pertinent labs & imaging results that were available during my care of the patient were reviewed by me and considered in my medical decision making (see chart for details).  Patient seen and examined. Plan discussed with patient.   Labs: CBC, CMP  Imaging: Head CT, x-ray of the right hip and pelvis  Medications/Fluids: 4mg  of Zofran for active vomiting, as needed fentanyl as needed for pain  Vital signs reviewed and are as follows: BP (!) 208/80 (BP Location: Right Arm)    Pulse 60    Temp 97.6 F (36.4 C) (Oral)    Resp 18    SpO2 100% Comment: 2LPM  Initial impression: Hip injury, minor head injury, fall  10:27 PM patient still having a lot of nausea, 12.5 mg Phenergan ordered.  I spoke with Dr. Mable Fill on-call with orthopedist regarding hip fracture.  He has reviewed imaging.  Agrees with CT.  Agrees with hospitalist admit, possible repair tomorrow.  11:07 PM Spoke with Dr. Trilby Drummer, [facility] who will see.      MDM Rules/Calculators/A&P                          Admit.       Final Clinical Impression(s) / ED Diagnoses Final diagnoses:  Closed fracture of right hip, initial encounter ([place])  Primary hypertension    Rx / DC Orders ED Discharge Orders  None        Carlisle Cater, Hershal Coria 08/18/21 2308    Fredia Sorrow, MD 09/02/21 2329

## 2021-08-18 NOTE — ED Triage Notes (Signed)
BIB [facility] from [facility]. Fell while attempting to sit back onto wheelchair. Complains of minor R Hip Pain and N/V. 100 Fentanyl and 4 Zofran given by EMS. Hx pacemaker and taking Xarelto.

## 2021-08-18 NOTE — ED Notes (Signed)
Patient transported to CT 

## 2021-08-18 NOTE — H&P (Signed)
History and Physical   Monica Murillo IOE:[id number] DOB: 1933/01/04 DOA: 08/18/2021  PCP: Yvonna Alanis, NP   Patient coming from: [facility]  Chief Complaint: Fall  HPI: Monica Murillo is a 87 y.o. female with medical history significant of complete heart block, GERD, hypothyroidism, memory impairment, pulmonary fibrosis on chronic oxygen, diabetes, CAD, anemia who presents after a fall at her facility.  Patient was attempting to sit backwards onto a wheelchair and she had a fall.  She landed on her right hip and has right hip pain.  Unable to bear weight after the fall due to pain.  Is on Xarelto.  Did receive fentanyl and Zofran in route and has continued pain and nausea and vomiting after receiving pain medication.  She denies fevers, chills, chest pain, shortness of breath, abdominal pain, constipation, diarrhea.   ED Course: Vital signs in the ED significant for blood pressure in the 182X systolic.  On chronic 2 L.  Lab work-up showed CMP with bicarb 20, glucose 196, calcium 8.8, protein 6.1.  CBC with mild leukocytosis to 12.4.  PT and INR within normal limits.  Respiratory panel for flu and COVID pending.  Chest x-ray showed central vascular congestion and interstitial prominence consistent with edema or scarring, right knee x-ray showed no acute normality, right hip x-ray showed right proximal femur shaft fracture.  CT head and CT of the hip are pending.  Patient received fentanyl and Zofran in the ED.  Orthopedic was consulted and will likely do surgical intervention n.p.o. at midnight and hospitalist will admit.  Review of Systems: As per HPI otherwise all other systems reviewed and are negative.  Past Medical History:  Diagnosis Date   Anemia    Heart block    History of blood transfusion    History of heart artery stent    Hypertension    Hypothyroid    Pacemaker    Pulmonary fibrosis (HCC)    Respiratory failure, chronic ([place])    Type 2 diabetes  mellitus ([place])     Past Surgical History:  Procedure Laterality Date   ABDOMINAL HYSTERECTOMY  1981   BREAST LUMPECTOMY  2011   CARDIAC PACEMAKER PLACEMENT  2018   CORONARY STENT PLACEMENT  2012    Social History  reports that she has quit smoking. Her smoking use included cigarettes. She has a 50.00 pack-year smoking history. She has never used smokeless tobacco. She reports current alcohol use. She reports that she does not use drugs.  Allergies  Allergen Reactions   Epinephrine    Lidocaine-Epinephrine    Sulfa Antibiotics Nausea And Vomiting and Rash    Sulfa and related      Family History  Problem Relation Age of Onset   Other Mother        Enlarged Heart   Dementia Sister   Reviewed on admission  Prior to Admission medications   Medication Sig Start Date End Date Taking? Authorizing Provider  Acetaminophen (TYLENOL 8 HOUR ARTHRITIS PAIN PO) Take 600 mg by mouth in the morning and at bedtime.    [provider]  Esomeprazole Magnesium (NEXIUM PO) Take by mouth.    [provider]  levothyroxine (SYNTHROID) 125 MCG tablet Take 125 mcg by mouth daily.    [provider]  Lutein-Zeaxanthin 20-1 MG CAPS Take by mouth.    [provider]  METOPROLOL SUCCINATE PO Take 25 mg by mouth daily.    [provider]  OXYGEN Inhale into the  lungs. 2 Liters    [provider]  rivaroxaban (XARELTO) 10 MG TABS tablet Take 1 tablet (10 mg total) by mouth daily. 08/03/21   Berniece Salines, DO    Physical Exam: Vitals:   08/18/21 2051 08/18/21 2055  BP: (!) 208/80   Pulse: 60   Resp: 18   Temp: 97.6 F (36.4 C)   TempSrc: Oral   SpO2: 100% 100%   Physical Exam Constitutional:      General: She is not in acute distress.    Appearance: Normal appearance.  HENT:     Head: Normocephalic and atraumatic.     Mouth/Throat:     Mouth: Mucous membranes are moist.     Pharynx: Oropharynx is clear.  Eyes:     Extraocular  Movements: Extraocular movements intact.     Pupils: Pupils are equal, round, and reactive to light.  Cardiovascular:     Rate and Rhythm: Normal rate and regular rhythm.     Pulses: Normal pulses.     Heart sounds: Normal heart sounds.  Pulmonary:     Effort: Pulmonary effort is normal. No respiratory distress.     Breath sounds: Normal breath sounds.  Abdominal:     General: Bowel sounds are normal. There is no distension.     Palpations: Abdomen is soft.     Tenderness: There is no abdominal tenderness.  Musculoskeletal:        General: No swelling or deformity.     Comments: Lateral lower extremities remained neurovascularly intact.  Significant pain with any movement or palpation of right hip.  Skin:    General: Skin is warm and dry.  Neurological:     General: No focal deficit present.     Mental Status: Mental status is at baseline.   Labs on Admission: I have personally reviewed following labs and imaging studies  CBC: Recent Labs  Lab 08/18/21 2114  WBC 12.4*  NEUTROABS 9.3*  HGB 13.3  HCT 43.0  MCV 94.5  PLT 657    Basic Metabolic Panel: Recent Labs  Lab 08/18/21 2114  NA 136  K 4.3  CL 104  CO2 20*  GLUCOSE 196*  BUN 17  CREATININE 0.76  CALCIUM 8.8*    GFR: Estimated Creatinine Clearance: 36.3 mL/min (by C-G formula based on SCr of 0.76 mg/dL).  Liver Function Tests: Recent Labs  Lab 08/18/21 2114  AST 28  ALT 21  ALKPHOS 82  BILITOT 1.0  PROT 6.1*  ALBUMIN 3.9    Urine analysis: No results found for: COLORURINE, APPEARANCEUR, [place], [place], GLUCOSEU, [place], BILIRUBINUR, KETONESUR, PROTEINUR, UROBILINOGEN, NITRITE, LEUKOCYTESUR  Radiological Exams on Admission: DG Chest 1 View  Result Date: 08/18/2021 CLINICAL DATA:  [place], hip fracture, preoperative evaluation EXAM: CHEST  1 VIEW COMPARISON:  None. FINDINGS: Single frontal view of the chest demonstrates dual lead pacer overlying right chest. Cardiac silhouette is enlarged. Marked  ectasia and atherosclerosis of the thoracic aorta. Mild central vascular congestion. Diffuse interstitial prominence could reflect interstitial edema or chronic lung scarring. No acute airspace disease, effusion, or pneumothorax. IMPRESSION: 1. Central vascular congestion and diffuse interstitial prominence, consistent with interstitial edema or chronic scarring. No acute airspace disease. Electronically Signed   By: Randa Ngo M.D.   On: 08/18/2021 22:27   CT HEAD WO CONTRAST  Result Date: 08/18/2021 CLINICAL DATA:  Head trauma. EXAM: CT HEAD WITHOUT CONTRAST TECHNIQUE: Contiguous axial images were obtained from the base of the skull through the vertex without intravenous contrast. COMPARISON:  None. FINDINGS: Brain: Moderate age-related atrophy and chronic microvascular ischemic changes. Old area of infarct with associated dystrophic calcification in the superior right cerebellum. There is no acute intracranial hemorrhage. No mass effect or midline shift. No extra-axial fluid collection. Vascular: No hyperdense vessel or unexpected calcification. Skull: Normal. Negative for fracture or focal lesion. Sinuses/Orbits: No acute finding. Other: None IMPRESSION: 1. No acute intracranial pathology. 2. Moderate age-related atrophy and chronic microvascular ischemic changes. Old right cerebellar infarct. Electronically Signed   By: Anner Crete M.D.   On: 08/18/2021 23:38   CT Hip Right Wo Contrast  Result Date: 08/18/2021 CLINICAL DATA:  Fall. Hip trauma with fracture suspected. Pain with movement. EXAM: CT OF THE RIGHT HIP WITHOUT CONTRAST TECHNIQUE: Multidetector CT imaging of the right hip was performed according to the standard protocol. Multiplanar CT image reconstructions were also generated. COMPARISON:  Right hip radiographs 08/18/2021 FINDINGS: Bones/Joint/Cartilage Diffuse bone demineralization. Comminuted inter trochanteric fractures of the proximal right femur with fracture lines extending  through the trochanteric region to the greater and lesser trochanters. Mild impaction without significant displacement of fracture fragments. No additional fractures demonstrated in the visualized acetabulum or right hemipelvis. Severe degenerative changes in the right hip with joint space narrowing, subcortical cyst formation, and prominent osteophyte formation. Ligaments Suboptimally assessed by CT. Muscles and Tendons Fatty atrophy of the right hip musculature. No significant intramuscular hematoma. Soft tissues Visualized pelvic organs appear intact.  Vascular calcifications. IMPRESSION: Comminuted intertrochanteric fractures of the proximal right femur without significant displacement. Severe degenerative changes in the right hip without dislocation. Electronically Signed   By: Lucienne Capers M.D.   On: 08/18/2021 23:44   DG Knee Complete 4 Views Right  Result Date: 08/18/2021 CLINICAL DATA:  [place], hip fracture, pain EXAM: RIGHT KNEE - COMPLETE 4+ VIEW COMPARISON:  08/18/2021 FINDINGS: Frontal, bilateral oblique, and cross-table lateral views of the right knee are obtained. No fracture, subluxation, or dislocation. There is mild 3 compartmental osteoarthritis. No joint effusion. Soft tissues are unremarkable. IMPRESSION: 1. Mild osteoarthritis.  No acute fracture. Electronically Signed   By: Randa Ngo M.D.   On: 08/18/2021 22:28   DG Hip Unilat With Pelvis 2-3 Views Right  Result Date: 08/18/2021 CLINICAL DATA:  Fall with hip pain EXAM: DG HIP (WITH OR WITHOUT PELVIS) 2-3V RIGHT COMPARISON:  None. FINDINGS: SI joints are non widened. Pubic symphysis and rami appear intact. Acute nondisplaced fracture on frog-leg view, appears to involve the lower trochanter and possibly the proximal shaft. Severe right hip arthritis with bone on bone appearance, sclerosis and subarticular cysts. IMPRESSION: Acute nondisplaced right proximal femoral fracture appears to involve the lower trochanter and possibly  the proximal shaft, CT could be obtained for more thorough fracture evaluation. Electronically Signed   By: Donavan Foil M.D.   On: 08/18/2021 21:41    EKG: Independently reviewed.  Atrial paced rhythm at 66 bpm.  PVC noted.  Assessment/Plan Principal Problem:   Femur fracture, right (HCC) Active Problems:   Complete heart block (HCC)   Pulmonary fibrosis (HCC)   Type 2 diabetes mellitus with hyperglycemia, without long-term current use of insulin (HCC)   Hypothyroidism   Gastroesophageal reflux disease without esophagitis   Past heart attack  Right hip fracture > Patient with a fall while sitting backwards onto a chair noted to have fracture of the proximal right femur on x-ray. > Orthopedics consulted recommending CT and will see the patient in consult tomorrow.  Likely surgery. - Appreciate orthopedics recommendations - Monitor  her on telemetry - Continue with hip fracture protocol - Pain control as needed with as needed Norco for moderate pain and as needed Dilaudid for severe pain - Close monitoring while on opioid pain medication  Complete heart block > Status post pacemaker - Holding home Xarelto in the setting of likely upcoming surgery  CAD > History of MI listed in chart - Continue home metoprolol  Diabetes - SSI   Pulmonary first process Chronic respiratory failure > Chest x-ray changes likely representing this chronic scarring, do not suspect infection  despite mild leukocytosis. - Continue home 2 L of oxygen - We will trend WBC, suspected to be reactive  Hypothyroidism - Continue home Synthroid  GERD - Continue PPI  DVT prophylaxis: SCDs  Code Status:   DNR Family Communication:  Daughter updated by phone, he is going out of town/country however she will have her cell phone with her, would like to be updated with any significant updates.  Can also call her son, the patient's grandson, I have added his information to the chart. Disposition Plan:    Patient is from:  [facility]   Anticipated DC to:  Same as above  Anticipated DC date:  1-3 days  Anticipated DC barriers: None  Consults called:  Orthopedics consulted by EDP  Admission status:  Obs, telemetry   Severity of Illness: The appropriate patient status for this patient is OBSERVATION. Observation status is judged to be reasonable and necessary in order to provide the required intensity of service to ensure the patient's safety. The patient's presenting symptoms, physical exam findings, and initial radiographic and laboratory data in the context of their medical condition is felt to place them at decreased risk for further clinical deterioration. Furthermore, it is anticipated that the patient will be medically stable for discharge from the hospital within 2 midnights of admission.    Marcelyn Bruins MD [facility]  How to contact the [facility] Attending or Consulting provider [place] or covering provider during after hours [place], for this patient?   Check the care team in [facility] and look for a) attending/consulting [facility] provider listed and b) the [facility] team listed Log into [email or web address] and use [place]'s universal password to access. If you do not have the password, please contact the hospital operator. Locate the [facility] provider you are looking for under [facility] and page to a number that you can be directly reached. If you still have difficulty reaching the provider, please page the [facility] (Director on Call) for the Hospitalists listed on amion for assistance.  08/18/2021, 11:51 PM

## 2021-08-18 NOTE — Progress Notes (Signed)
Patient discussed with EDP PA.  Patient previously ambulatory, unable to bear weight after fall.  Current imaging concerning for nondisplaced intertrochanteric fracture.  Agree with CT to better characterize.  May benefit from surgical stabilization.  Admit to medicine for preop clearance/risk stratification/optimization.  Ortho trauma PA to see in AM with full consult and plan to follow.  [name] M.D. Orthopaedic Surgery [facility]

## 2021-08-19 DIAGNOSIS — S72144D Nondisplaced intertrochanteric fracture of right femur, subsequent encounter for closed fracture with routine healing: Secondary | ICD-10-CM | POA: Diagnosis not present

## 2021-08-19 DIAGNOSIS — S72001A Fracture of unspecified part of neck of right femur, initial encounter for closed fracture: Secondary | ICD-10-CM | POA: Diagnosis not present

## 2021-08-19 DIAGNOSIS — I480 Paroxysmal atrial fibrillation: Secondary | ICD-10-CM | POA: Diagnosis present

## 2021-08-19 DIAGNOSIS — Z9981 Dependence on supplemental oxygen: Secondary | ICD-10-CM | POA: Diagnosis not present

## 2021-08-19 DIAGNOSIS — I2510 Atherosclerotic heart disease of native coronary artery without angina pectoris: Secondary | ICD-10-CM | POA: Diagnosis present

## 2021-08-19 DIAGNOSIS — I10 Essential (primary) hypertension: Secondary | ICD-10-CM | POA: Diagnosis present

## 2021-08-19 DIAGNOSIS — I442 Atrioventricular block, complete: Secondary | ICD-10-CM | POA: Diagnosis present

## 2021-08-19 DIAGNOSIS — Z79890 Hormone replacement therapy (postmenopausal): Secondary | ICD-10-CM | POA: Diagnosis not present

## 2021-08-19 DIAGNOSIS — D72829 Elevated white blood cell count, unspecified: Secondary | ICD-10-CM | POA: Diagnosis present

## 2021-08-19 DIAGNOSIS — J9611 Chronic respiratory failure with hypoxia: Secondary | ICD-10-CM | POA: Diagnosis present

## 2021-08-19 DIAGNOSIS — Z66 Do not resuscitate: Secondary | ICD-10-CM | POA: Diagnosis present

## 2021-08-19 DIAGNOSIS — M80051A Age-related osteoporosis with current pathological fracture, right femur, initial encounter for fracture: Secondary | ICD-10-CM | POA: Diagnosis present

## 2021-08-19 DIAGNOSIS — W19XXXD Unspecified fall, subsequent encounter: Secondary | ICD-10-CM | POA: Diagnosis not present

## 2021-08-19 DIAGNOSIS — Z886 Allergy status to analgesic agent status: Secondary | ICD-10-CM | POA: Diagnosis not present

## 2021-08-19 DIAGNOSIS — J8410 Pulmonary fibrosis, unspecified: Secondary | ICD-10-CM | POA: Diagnosis present

## 2021-08-19 DIAGNOSIS — M1611 Unilateral primary osteoarthritis, right hip: Secondary | ICD-10-CM | POA: Diagnosis present

## 2021-08-19 DIAGNOSIS — K219 Gastro-esophageal reflux disease without esophagitis: Secondary | ICD-10-CM | POA: Diagnosis present

## 2021-08-19 DIAGNOSIS — Y92039 Unspecified place in apartment as the place of occurrence of the external cause: Secondary | ICD-10-CM | POA: Diagnosis not present

## 2021-08-19 DIAGNOSIS — Z87891 Personal history of nicotine dependence: Secondary | ICD-10-CM | POA: Diagnosis not present

## 2021-08-19 DIAGNOSIS — E1165 Type 2 diabetes mellitus with hyperglycemia: Secondary | ICD-10-CM | POA: Diagnosis present

## 2021-08-19 DIAGNOSIS — Z950 Presence of cardiac pacemaker: Secondary | ICD-10-CM | POA: Diagnosis not present

## 2021-08-19 DIAGNOSIS — Z20822 Contact with and (suspected) exposure to covid-19: Secondary | ICD-10-CM | POA: Diagnosis present

## 2021-08-19 DIAGNOSIS — I252 Old myocardial infarction: Secondary | ICD-10-CM | POA: Diagnosis not present

## 2021-08-19 DIAGNOSIS — Z955 Presence of coronary angioplasty implant and graft: Secondary | ICD-10-CM | POA: Diagnosis not present

## 2021-08-19 DIAGNOSIS — Z90710 Acquired absence of both cervix and uterus: Secondary | ICD-10-CM | POA: Diagnosis not present

## 2021-08-19 DIAGNOSIS — W050XXA Fall from non-moving wheelchair, initial encounter: Secondary | ICD-10-CM | POA: Diagnosis present

## 2021-08-19 DIAGNOSIS — E039 Hypothyroidism, unspecified: Secondary | ICD-10-CM | POA: Diagnosis present

## 2021-08-19 DIAGNOSIS — M549 Dorsalgia, unspecified: Secondary | ICD-10-CM | POA: Diagnosis present

## 2021-08-19 DIAGNOSIS — M25551 Pain in right hip: Secondary | ICD-10-CM | POA: Diagnosis present

## 2021-08-19 DIAGNOSIS — Z7901 Long term (current) use of anticoagulants: Secondary | ICD-10-CM | POA: Diagnosis not present

## 2021-08-19 LAB — CBG MONITORING, ED
Glucose-Capillary: 151 mg/dL — ABNORMAL HIGH (ref 70–99)
Glucose-Capillary: 140 mg/dL — ABNORMAL HIGH (ref 70–99)

## 2021-08-19 LAB — RESP PANEL BY RT-PCR (FLU A&B, COVID) ARPGX2
Influenza A by PCR: NEGATIVE
Influenza B by PCR: NEGATIVE
SARS Coronavirus 2 by RT PCR: NEGATIVE

## 2021-08-19 LAB — GLUCOSE, CAPILLARY
Glucose-Capillary: 134 mg/dL — ABNORMAL HIGH (ref 70–99)
Glucose-Capillary: 132 mg/dL — ABNORMAL HIGH (ref 70–99)

## 2021-08-19 LAB — ABO/RH: ABO/RH(D): O NEG

## 2021-08-19 MED ORDER — CHLORHEXIDINE GLUCONATE 4 % EX LIQD
60.0000 mL | Freq: Once | CUTANEOUS | Status: AC
  Administered 2021-08-20: 4 via TOPICAL
  Filled 2021-08-19 (×2): qty 60

## 2021-08-19 MED ORDER — LABETALOL HCL 5 MG/ML IV SOLN
10.0000 mg | INTRAVENOUS | Status: DC | PRN
  Filled 2021-08-19: qty 4

## 2021-08-19 MED ORDER — POVIDONE-IODINE 10 % EX SWAB
2.0000 "application " | Freq: Once | CUTANEOUS | Status: AC
  Administered 2021-08-20: 2 via TOPICAL

## 2021-08-19 MED ORDER — CHLORHEXIDINE GLUCONATE 4 % EX LIQD
60.0000 mL | Freq: Once | CUTANEOUS | Status: DC
  Filled 2021-08-19: qty 60

## 2021-08-19 MED ORDER — POVIDONE-IODINE 10 % EX SWAB: 2.0000 | Freq: Once | CUTANEOUS | Status: DC

## 2021-08-19 MED ORDER — CEFAZOLIN SODIUM-DEXTROSE 2-4 GM/100ML-% IV SOLN
2.0000 g | INTRAVENOUS | Status: AC
Start: 2021-08-20 — End: 2021-08-20
  Administered 2021-08-20: 13:00:00 2 g via INTRAVENOUS
  Filled 2021-08-19: qty 100

## 2021-08-19 MED ORDER — HYDRALAZINE HCL 25 MG PO TABS: 25.0000 mg | ORAL_TABLET | Freq: Three times a day (TID) | ORAL | Status: DC

## 2021-08-19 MED ORDER — AMLODIPINE BESYLATE 10 MG PO TABS
10.0000 mg | ORAL_TABLET | Freq: Every day | ORAL | Status: DC
  Administered 2021-08-21 – 2021-08-23 (×3): 10 mg via ORAL
  Filled 2021-08-19 (×3): qty 1

## 2021-08-19 MED ORDER — SODIUM CHLORIDE 0.9 % IV SOLN: INTRAVENOUS | Status: DC

## 2021-08-19 MED ORDER — ONDANSETRON HCL 4 MG/2ML IJ SOLN
4.0000 mg | Freq: Four times a day (QID) | INTRAMUSCULAR | Status: DC | PRN
  Administered 2021-08-19 – 2021-08-20 (×2): 4 mg via INTRAVENOUS
  Filled 2021-08-19 (×2): qty 2

## 2021-08-19 MED ORDER — HYDRALAZINE HCL 50 MG PO TABS
50.0000 mg | ORAL_TABLET | Freq: Three times a day (TID) | ORAL | Status: DC
  Administered 2021-08-19 – 2021-08-20 (×3): 50 mg via ORAL
  Filled 2021-08-19 (×5): qty 1

## 2021-08-19 NOTE — Consult Note (Signed)
Reason for Consult:Right hip fx Referring Physician: Domenic Polite Time called: 0804 Time at bedside: [name] is an 87 y.o. female.  HPI: Monica Murillo was going to sit down and missed her chair and fell. She had immediate right hip pain and could not get up. She was brought to the ED where x-rays showed a right hip fx and orthopedic surgery was consulted. She lives in a continuing care facility in independent living.  Past Medical History:  Diagnosis Date   Anemia    Heart block    History of blood transfusion    History of heart artery stent    Hypertension    Hypothyroid    Pacemaker    Pulmonary fibrosis (HCC)    Respiratory failure, chronic (HCC)    Type 2 diabetes mellitus ([place])     Past Surgical History:  Procedure Laterality Date   ABDOMINAL HYSTERECTOMY  1981   BREAST LUMPECTOMY  2011   CARDIAC PACEMAKER PLACEMENT  2018   CORONARY STENT PLACEMENT  2012    Family History  Problem Relation Age of Onset   Other Mother        Enlarged Heart   Dementia Sister     Social History:  reports that she has quit smoking. Her smoking use included cigarettes. She has a 50.00 pack-year smoking history. She has never used smokeless tobacco. She reports current alcohol use. She reports that she does not use drugs.  Allergies:  Allergies  Allergen Reactions   Epinephrine    Lidocaine-Epinephrine    Sulfa Antibiotics Nausea And Vomiting and Rash    Sulfa and related      Medications: I have reviewed the patient's current medications.  Results for orders placed or performed during the hospital encounter of 08/18/21 (from the past 48 hour(s))  CBC WITH DIFFERENTIAL     Status: Abnormal   Collection Time: 08/18/21  9:14 PM  Result Value Ref Range   WBC 12.4 (H) 4.0 - 10.5 K/uL   RBC 4.55 3.87 - 5.11 MIL/uL   Hemoglobin 13.3 12.0 - 15.0 g/dL   HCT 43.0 36.0 - 46.0 %   MCV 94.5 80.0 - 100.0 fL   MCH 29.2 26.0 - 34.0 pg   MCHC 30.9 30.0 - 36.0 g/dL   RDW 13.6  11.5 - 15.5 %   Platelets 208 150 - 400 K/uL   nRBC 0.0 0.0 - 0.2 %   Neutrophils Relative % 75 %   Neutro Abs 9.3 (H) 1.7 - 7.7 K/uL   Lymphocytes Relative 17 %   Lymphs Abs 2.1 0.7 - 4.0 K/uL   Monocytes Relative 5 %   Monocytes Absolute 0.6 0.1 - 1.0 K/uL   Eosinophils Relative 1 %   Eosinophils Absolute 0.1 0.0 - 0.5 K/uL   Basophils Relative 1 %   Basophils Absolute 0.1 0.0 - 0.1 K/uL   Immature Granulocytes 1 %   Abs Immature Granulocytes 0.13 (H) 0.00 - 0.07 K/uL    Comment: Performed at [facility], [address] [place], [place] [address]  Type and screen [place]     Status: None   Collection Time: 08/18/21  9:14 PM  Result Value Ref Range   ABO/RH(D) O NEG    Antibody Screen NEG    Sample Expiration      08/21/2021,2359 Performed at [facility], [place] [address], [place], [place] [address]   Comprehensive metabolic panel     Status:  Abnormal   Collection Time: 08/18/21  9:14 PM  Result Value Ref Range   Sodium 136 135 - 145 mmol/L   Potassium 4.3 3.5 - 5.1 mmol/L    Comment: SLIGHT HEMOLYSIS   Chloride 104 98 - 111 mmol/L   CO2 20 (L) 22 - 32 mmol/L   Glucose, Bld 196 (H) 70 - 99 mg/dL    Comment: Glucose reference range applies only to samples taken after fasting for at least 8 hours.   BUN 17 8 - 23 mg/dL   Creatinine, Ser 0.76 0.44 - 1.00 mg/dL   Calcium 8.8 (L) 8.9 - 10.3 mg/dL   Total Protein 6.1 (L) 6.5 - 8.1 g/dL   Albumin 3.9 3.5 - 5.0 g/dL   AST 28 15 - 41 U/L   ALT 21 0 - 44 U/L   Alkaline Phosphatase 82 38 - 126 U/L   Total Bilirubin 1.0 0.3 - 1.2 mg/dL   GFR, Estimated >60 >60 mL/min    Comment: (NOTE) Calculated using the CKD-EPI Creatinine Equation (2021)    Anion gap 12 5 - 15    Comment: Performed at [facility], [place] [address], [place], [place] [address]  Protime-INR     Status: None   Collection Time: 08/18/21  9:54 PM  Result Value Ref Range   Prothrombin Time 14.4 11.4 - 15.2 seconds   INR  1.1 0.8 - 1.2    Comment: (NOTE) INR goal varies based on device and disease states. Performed at [facility], [place] [address], [place],  [address]   Resp Panel by RT-PCR (Flu A&B, Covid) Nasopharyngeal Swab     Status: None   Collection Time: 08/18/21  9:56 PM   Specimen: Nasopharyngeal Swab; Nasopharyngeal(NP) swabs in vial transport medium  Result Value Ref Range   SARS Coronavirus 2 by RT PCR NEGATIVE NEGATIVE    Comment: (NOTE) SARS-CoV-2 target nucleic acids are NOT DETECTED.  The SARS-CoV-2 RNA is generally detectable in upper respiratory specimens during the acute phase of infection. The lowest concentration of SARS-CoV-2 viral copies this assay can detect is 138 copies/mL. A negative result does not preclude SARS-Cov-2 infection and should not be used as the sole basis for treatment or other patient management decisions. A negative result may occur with  improper specimen collection/handling, submission of specimen other than nasopharyngeal swab, presence of viral mutation(s) within the areas targeted by this assay, and inadequate number of viral copies(<138 copies/mL). A negative result must be combined with clinical observations, patient history, and epidemiological information. The expected result is Negative.  Fact Sheet for Patients:  EntrepreneurPulse.com.au  Fact Sheet for Healthcare Providers:  IncredibleEmployment.be  This test is no t yet approved or cleared by the [place] FDA and  has been authorized for detection and/or diagnosis of SARS-CoV-2 by FDA under an Emergency Use Authorization (EUA). This EUA will remain  in effect (meaning this test can be used) for the duration of the COVID-19 declaration under Section 564(b)(1) of the Act, 21 U.S.C.section 360bbb-3(b)(1), unless the authorization is terminated  or revoked sooner.       Influenza A by PCR NEGATIVE NEGATIVE   Influenza B by PCR NEGATIVE  NEGATIVE    Comment: (NOTE) The Xpert Xpress SARS-CoV-2/FLU/RSV plus assay is intended as an aid in the diagnosis of influenza from Nasopharyngeal swab specimens and should not be used as a sole basis for treatment. Nasal washings and aspirates are unacceptable for Xpert Xpress SARS-CoV-2/FLU/RSV testing.  Fact Sheet  for Patients: EntrepreneurPulse.com.au  Fact Sheet for Healthcare Providers: IncredibleEmployment.be  This test is not yet approved or cleared by the [place] FDA and has been authorized for detection and/or diagnosis of SARS-CoV-2 by FDA under an Emergency Use Authorization (EUA). This EUA will remain in effect (meaning this test can be used) for the duration of the COVID-19 declaration under Section 564(b)(1) of the Act, 21 U.S.C. section 360bbb-3(b)(1), unless the authorization is terminated or revoked.  Performed at [facility], [place] [address], [place], [place] [address]   CBG monitoring, ED     Status: Abnormal   Collection Time: 08/19/21  8:39 AM  Result Value Ref Range   Glucose-Capillary 151 (H) 70 - 99 mg/dL    Comment: Glucose reference range applies only to samples taken after fasting for at least 8 hours.   Comment 1 Notify RN    Comment 2 Document in Chart     DG Chest 1 View  Result Date: 08/18/2021 CLINICAL DATA:  [place], hip fracture, preoperative evaluation EXAM: CHEST  1 VIEW COMPARISON:  None. FINDINGS: Single frontal view of the chest demonstrates dual lead pacer overlying right chest. Cardiac silhouette is enlarged. Marked ectasia and atherosclerosis of the thoracic aorta. Mild central vascular congestion. Diffuse interstitial prominence could reflect interstitial edema or chronic lung scarring. No acute airspace disease, effusion, or pneumothorax. IMPRESSION: 1. Central vascular congestion and diffuse interstitial prominence, consistent with interstitial edema or chronic scarring. No acute airspace  disease. Electronically Signed   By: Randa Ngo M.D.   On: 08/18/2021 22:27   CT HEAD WO CONTRAST  Result Date: 08/18/2021 CLINICAL DATA:  Head trauma. EXAM: CT HEAD WITHOUT CONTRAST TECHNIQUE: Contiguous axial images were obtained from the base of the skull through the vertex without intravenous contrast. COMPARISON:  None. FINDINGS: Brain: Moderate age-related atrophy and chronic microvascular ischemic changes. Old area of infarct with associated dystrophic calcification in the superior right cerebellum. There is no acute intracranial hemorrhage. No mass effect or midline shift. No extra-axial fluid collection. Vascular: No hyperdense vessel or unexpected calcification. Skull: Normal. Negative for fracture or focal lesion. Sinuses/Orbits: No acute finding. Other: None IMPRESSION: 1. No acute intracranial pathology. 2. Moderate age-related atrophy and chronic microvascular ischemic changes. Old right cerebellar infarct. Electronically Signed   By: Anner Crete M.D.   On: 08/18/2021 23:38   CT Hip Right Wo Contrast  Result Date: 08/18/2021 CLINICAL DATA:  Fall. Hip trauma with fracture suspected. Pain with movement. EXAM: CT OF THE RIGHT HIP WITHOUT CONTRAST TECHNIQUE: Multidetector CT imaging of the right hip was performed according to the standard protocol. Multiplanar CT image reconstructions were also generated. COMPARISON:  Right hip radiographs 08/18/2021 FINDINGS: Bones/Joint/Cartilage Diffuse bone demineralization. Comminuted inter trochanteric fractures of the proximal right femur with fracture lines extending through the trochanteric region to the greater and lesser trochanters. Mild impaction without significant displacement of fracture fragments. No additional fractures demonstrated in the visualized acetabulum or right hemipelvis. Severe degenerative changes in the right hip with joint space narrowing, subcortical cyst formation, and prominent osteophyte formation. Ligaments  Suboptimally assessed by CT. Muscles and Tendons Fatty atrophy of the right hip musculature. No significant intramuscular hematoma. Soft tissues Visualized pelvic organs appear intact.  Vascular calcifications. IMPRESSION: Comminuted intertrochanteric fractures of the proximal right femur without significant displacement. Severe degenerative changes in the right hip without dislocation. Electronically Signed   By: Lucienne Capers M.D.   On: 08/18/2021 23:44   DG Knee Complete 4 Views Right  Result Date: 08/18/2021 CLINICAL DATA:  [place], hip fracture, pain EXAM: RIGHT KNEE - COMPLETE 4+ VIEW COMPARISON:  08/18/2021 FINDINGS: Frontal, bilateral oblique, and cross-table lateral views of the right knee are obtained. No fracture, subluxation, or dislocation. There is mild 3 compartmental osteoarthritis. No joint effusion. Soft tissues are unremarkable. IMPRESSION: 1. Mild osteoarthritis.  No acute fracture. Electronically Signed   By: Randa Ngo M.D.   On: 08/18/2021 22:28   DG Hip Unilat With Pelvis 2-3 Views Right  Result Date: 08/18/2021 CLINICAL DATA:  Fall with hip pain EXAM: DG HIP (WITH OR WITHOUT PELVIS) 2-3V RIGHT COMPARISON:  None. FINDINGS: SI joints are non widened. Pubic symphysis and rami appear intact. Acute nondisplaced fracture on frog-leg view, appears to involve the lower trochanter and possibly the proximal shaft. Severe right hip arthritis with bone on bone appearance, sclerosis and subarticular cysts. IMPRESSION: Acute nondisplaced right proximal femoral fracture appears to involve the lower trochanter and possibly the proximal shaft, CT could be obtained for more thorough fracture evaluation. Electronically Signed   By: Donavan Foil M.D.   On: 08/18/2021 21:41    Review of Systems  HENT:  Negative for ear discharge, ear pain, hearing loss and tinnitus.   Eyes:  Negative for photophobia and pain.  Respiratory:  Negative for cough and shortness of breath.   Cardiovascular:   Negative for chest pain.  Gastrointestinal:  Negative for abdominal pain, nausea and vomiting.  Genitourinary:  Negative for dysuria, flank pain, frequency and urgency.  Musculoskeletal:  Positive for arthralgias (Right hip). Negative for back pain, myalgias and neck pain.  Neurological:  Negative for dizziness and headaches.  Hematological:  Does not bruise/bleed easily.  Psychiatric/Behavioral:  The patient is not nervous/anxious.   Blood pressure (!) 162/77, pulse (!) 54, temperature 97.6 F (36.4 C), temperature source Oral, resp. rate 19, SpO2 100 %. Physical Exam Constitutional:      General: She is not in acute distress.    Appearance: She is well-developed. She is not diaphoretic.  HENT:     Head: Normocephalic and atraumatic.  Eyes:     General: No scleral icterus.       Right eye: No discharge.        Left eye: No discharge.     Conjunctiva/sclera: Conjunctivae normal.  Cardiovascular:     Rate and Rhythm: Normal rate and regular rhythm.  Pulmonary:     Effort: Pulmonary effort is normal. No respiratory distress.  Musculoskeletal:     Cervical back: Normal range of motion.     Comments: RLE No traumatic wounds, ecchymosis, or rash  Mod TTP hip  No knee or ankle effusion  Knee stable to varus/ valgus and anterior/posterior stress  Sens DPN, SPN, TN intact  Motor EHL, ext, flex, evers 5/5  DP 1+, PT 0, No significant edema  Skin:    General: Skin is warm and dry.  Neurological:     Mental Status: She is alert.  Psychiatric:        Mood and Affect: Mood normal.        Behavior: Behavior normal.    Assessment/Plan: Right hip fx -- Plan IMN tomorrow with Dr. Mable Fill. Please keep NPO after MN.    Lisette Abu, PA-C Orthopedic Surgery [phone] 08/19/2021, 9:39 AM

## 2021-08-19 NOTE — TOC CAGE-AID Note (Signed)
Transition of Care [facility]) - CAGE-AID Screening   Patient Details  Name: Monica Murillo MRN: [id number] Date of Birth: Mar 06, 1933  Transition of Care [facility]) CM/SW Contact:    Jammy Stlouis C Tarpley-Carter, [place] Phone Number: 08/19/2021, 2:50 PM   Clinical Narrative: Pt participated in [place].  Pt stated she does not use substance or ETOH.  Pt was offered resources, due to past  ETOH usage.   CSW will provide pt with resources for possible future use.  Keyshawn Hellwig Tarpley-Carter, MSW, LCSW-A Pronouns:  She/Her/Hers [place] Transitions of Care Clinical Social Worker Direct Number:  [phone] Filip [email or web address]    CAGE-AID Screening:    Have You Ever Felt You Ought to Cut Down on Your Drinking or Drug Use?: No Have People Annoyed You By [employer] Your Drinking Or Drug Use?: No Have You Felt Bad Or Guilty About Your Drinking Or Drug Use?: No Have You Ever Had a Drink or Used Drugs First Thing In The Morning to Steady Your Nerves or to Get Rid of a Hangover?: No CAGE-AID Score: 0  Substance Abuse Education Offered: Yes  Substance abuse interventions: Scientist, clinical (histocompatibility and immunogenetics)

## 2021-08-19 NOTE — Progress Notes (Signed)
PROGRESS NOTE    Monica Murillo  EYC:[id number] DOB: 1933/03/17 DOA: 08/18/2021 PCP: Yvonna Alanis, NP  Brief Narrative: [name] with history of CAD, with history of PCI and stents, dual-chamber pacemaker for complete heart block, paroxysmal atrial fibrillation on Xarelto, interstitial lung disease/pulmonary fibrosis on 2 L O2 recently moved to [place] to live closer to her daughter presented to the ED from [facility] after mechanical fall, fell while attempting to sit backwards onto a wheelchair, landed on her right hip, was unable to bear weight thereafter. -In the ED blood pressure was elevated in the 200s, labs noted WBC of 12.4, chest x-ray noted pulmonary vascular congestion, hip x-ray noted right proximal femur fracture   Assessment & Plan:   Right proximal femur fracture Mechanical fall -Orthopedics consulting, anticipate need for surgery -She is at least moderate cardiac risk for an intermediate risk procedure, continue beta-blocker -Will attempt better control of blood pressure -Pain control -Holding Xarelto  Paroxysmal atrial fibrillation Complete heart block > Status post pacemaker - Holding home Xarelto in the setting of likely upcoming surgery -Continue metoprolol  Uncontrolled hypertension -Continue metoprolol, add amlodipine and low-dose hydralazine   CAD > History of MI listed in chart - Continue home metoprolol  Diabetes -Diet controlled, sliding scale insulin   Pulmonary fibrosis/ILD Chronic respiratory failure > Chest x-ray changes secondary to ILD - Continue home 2 L of oxygen   Hypothyroidism - Continue home Synthroid  GERD - Continue PPI   DVT prophylaxis:      SCDs  Code Status:                         DNR Family Communication patient in detail, no family at bedside Disposition Plan: Anticipate need for rehab Status is: Observation  The patient will require care spanning > 2 midnights and should be moved to  inpatient because: Severity of illness  Consultants:  Orthopedics  Procedures:   Antimicrobials:    Subjective: -Complains of pain in her right hip, nauseated  Objective: Vitals:   08/19/21 1000 08/19/21 1015 08/19/21 1030 08/19/21 1045  BP: (!) 187/76 (!) 165/71 (!) 179/60 (!) 190/72  Pulse: 87 (!) 59 (!) 53 (!) 58  Resp: 14 13 13 13   Temp:      TempSrc:      SpO2: 100% 100% 99% 100%   No intake or output data in the 24 hours ending 08/19/21 1105 There were no vitals filed for this visit.  Examination:  General exam: Elderly female, comfortable appearing, AAOx3 Respiratory system: Fine bilateral rales Cardiovascular system: S1 & S2 heard, RRR.  Abd: nondistended, soft and nontender.Normal bowel sounds heard. Central nervous system: Alert and oriented. No focal neurological deficits. Extremities: No edema Skin: No rashes Psychiatry: Mood & affect appropriate.     Data Reviewed:   CBC: Recent Labs  Lab 08/18/21 2114  WBC 12.4*  NEUTROABS 9.3*  HGB 13.3  HCT 43.0  MCV 94.5  PLT 149   Basic Metabolic Panel: Recent Labs  Lab 08/18/21 2114  NA 136  K 4.3  CL 104  CO2 20*  GLUCOSE 196*  BUN 17  CREATININE 0.76  CALCIUM 8.8*   GFR: Estimated Creatinine Clearance: 36.3 mL/min (by C-G formula based on SCr of 0.76 mg/dL). Liver Function Tests: Recent Labs  Lab 08/18/21 2114  AST 28  ALT 21  ALKPHOS 82  BILITOT 1.0  PROT 6.1*  ALBUMIN 3.9   No results for  input(s): LIPASE, AMYLASE in the last 168 hours. No results for input(s): AMMONIA in the last 168 hours. Coagulation Profile: Recent Labs  Lab 08/18/21 2154  INR 1.1   Cardiac Enzymes: No results for input(s): CKTOTAL, CKMB, CKMBINDEX, TROPONINI in the last 168 hours. BNP (last 3 results) No results for input(s): PROBNP in the last 8760 hours. HbA1C: No results for input(s): HGBA1C in the last 72 hours. CBG: Recent Labs  Lab 08/19/21 0839  GLUCAP 151*   Lipid Profile: No results  for input(s): CHOL, HDL, LDLCALC, TRIG, CHOLHDL, LDLDIRECT in the last 72 hours. Thyroid Function Tests: No results for input(s): TSH, T4TOTAL, FREET4, T3FREE, THYROIDAB in the last 72 hours. Anemia Panel: No results for input(s): VITAMINB12, FOLATE, FERRITIN, TIBC, IRON, RETICCTPCT in the last 72 hours. Urine analysis: No results found for: COLORURINE, APPEARANCEUR, LABSPEC, PHURINE, GLUCOSEU, HGBUR, BILIRUBINUR, KETONESUR, PROTEINUR, UROBILINOGEN, NITRITE, LEUKOCYTESUR Sepsis Labs: @LABRCNTIP (procalcitonin:4,lacticidven:4)  ) Recent Results (from the past 240 hour(s))  Resp Panel by RT-PCR (Flu A&B, Covid) Nasopharyngeal Swab     Status: None   Collection Time: 08/18/21  9:56 PM   Specimen: Nasopharyngeal Swab; Nasopharyngeal(NP) swabs in vial transport medium  Result Value Ref Range Status   SARS Coronavirus 2 by RT PCR NEGATIVE NEGATIVE Final    Comment: (NOTE) SARS-CoV-2 target nucleic acids are NOT DETECTED.  The SARS-CoV-2 RNA is generally detectable in upper respiratory specimens during the acute phase of infection. The lowest concentration of SARS-CoV-2 viral copies this assay can detect is 138 copies/mL. A negative result does not preclude SARS-Cov-2 infection and should not be used as the sole basis for treatment or other patient management decisions. A negative result may occur with  improper specimen collection/handling, submission of specimen other than nasopharyngeal swab, presence of viral mutation(s) within the areas targeted by this assay, and inadequate number of viral copies(<138 copies/mL). A negative result must be combined with clinical observations, patient history, and epidemiological information. The expected result is Negative.  Fact Sheet for Patients:  EntrepreneurPulse.com.au  Fact Sheet for Healthcare Providers:  IncredibleEmployment.be  This test is no t yet approved or cleared by the [place] FDA and  has  been authorized for detection and/or diagnosis of SARS-CoV-2 by FDA under an Emergency Use Authorization (EUA). This EUA will remain  in effect (meaning this test can be used) for the duration of the COVID-19 declaration under Section 564(b)(1) of the Act, 21 U.S.C.section 360bbb-3(b)(1), unless the authorization is terminated  or revoked sooner.       Influenza A by PCR NEGATIVE NEGATIVE Final   Influenza B by PCR NEGATIVE NEGATIVE Final    Comment: (NOTE) The Xpert Xpress SARS-CoV-2/FLU/RSV plus assay is intended as an aid in the diagnosis of influenza from Nasopharyngeal swab specimens and should not be used as a sole basis for treatment. Nasal washings and aspirates are unacceptable for Xpert Xpress SARS-CoV-2/FLU/RSV testing.  Fact Sheet for Patients: EntrepreneurPulse.com.au  Fact Sheet for Healthcare Providers: IncredibleEmployment.be  This test is not yet approved or cleared by the [place] FDA and has been authorized for detection and/or diagnosis of SARS-CoV-2 by FDA under an Emergency Use Authorization (EUA). This EUA will remain in effect (meaning this test can be used) for the duration of the COVID-19 declaration under Section 564(b)(1) of the Act, 21 U.S.C. section 360bbb-3(b)(1), unless the authorization is terminated or revoked.  Performed at [facility], [place] [address], [place], [place] [address]          Radiology  Studies: DG Chest 1 View  Result Date: 08/18/2021 CLINICAL DATA:  [place], hip fracture, preoperative evaluation EXAM: CHEST  1 VIEW COMPARISON:  None. FINDINGS: Single frontal view of the chest demonstrates dual lead pacer overlying right chest. Cardiac silhouette is enlarged. Marked ectasia and atherosclerosis of the thoracic aorta. Mild central vascular congestion. Diffuse interstitial prominence could reflect interstitial edema or chronic lung scarring. No acute airspace disease, effusion, or  pneumothorax. IMPRESSION: 1. Central vascular congestion and diffuse interstitial prominence, consistent with interstitial edema or chronic scarring. No acute airspace disease. Electronically Signed   By: Randa Ngo M.D.   On: 08/18/2021 22:27   CT HEAD WO CONTRAST  Result Date: 08/18/2021 CLINICAL DATA:  Head trauma. EXAM: CT HEAD WITHOUT CONTRAST TECHNIQUE: Contiguous axial images were obtained from the base of the skull through the vertex without intravenous contrast. COMPARISON:  None. FINDINGS: Brain: Moderate age-related atrophy and chronic microvascular ischemic changes. Old area of infarct with associated dystrophic calcification in the superior right cerebellum. There is no acute intracranial hemorrhage. No mass effect or midline shift. No extra-axial fluid collection. Vascular: No hyperdense vessel or unexpected calcification. Skull: Normal. Negative for fracture or focal lesion. Sinuses/Orbits: No acute finding. Other: None IMPRESSION: 1. No acute intracranial pathology. 2. Moderate age-related atrophy and chronic microvascular ischemic changes. Old right cerebellar infarct. Electronically Signed   By: Anner Crete M.D.   On: 08/18/2021 23:38   CT Hip Right Wo Contrast  Result Date: 08/18/2021 CLINICAL DATA:  Fall. Hip trauma with fracture suspected. Pain with movement. EXAM: CT OF THE RIGHT HIP WITHOUT CONTRAST TECHNIQUE: Multidetector CT imaging of the right hip was performed according to the standard protocol. Multiplanar CT image reconstructions were also generated. COMPARISON:  Right hip radiographs 08/18/2021 FINDINGS: Bones/Joint/Cartilage Diffuse bone demineralization. Comminuted inter trochanteric fractures of the proximal right femur with fracture lines extending through the trochanteric region to the greater and lesser trochanters. Mild impaction without significant displacement of fracture fragments. No additional fractures demonstrated in the visualized acetabulum or right  hemipelvis. Severe degenerative changes in the right hip with joint space narrowing, subcortical cyst formation, and prominent osteophyte formation. Ligaments Suboptimally assessed by CT. Muscles and Tendons Fatty atrophy of the right hip musculature. No significant intramuscular hematoma. Soft tissues Visualized pelvic organs appear intact.  Vascular calcifications. IMPRESSION: Comminuted intertrochanteric fractures of the proximal right femur without significant displacement. Severe degenerative changes in the right hip without dislocation. Electronically Signed   By: Lucienne Capers M.D.   On: 08/18/2021 23:44   DG Knee Complete 4 Views Right  Result Date: 08/18/2021 CLINICAL DATA:  [place], hip fracture, pain EXAM: RIGHT KNEE - COMPLETE 4+ VIEW COMPARISON:  08/18/2021 FINDINGS: Frontal, bilateral oblique, and cross-table lateral views of the right knee are obtained. No fracture, subluxation, or dislocation. There is mild 3 compartmental osteoarthritis. No joint effusion. Soft tissues are unremarkable. IMPRESSION: 1. Mild osteoarthritis.  No acute fracture. Electronically Signed   By: Randa Ngo M.D.   On: 08/18/2021 22:28   DG Hip Unilat With Pelvis 2-3 Views Right  Result Date: 08/18/2021 CLINICAL DATA:  Fall with hip pain EXAM: DG HIP (WITH OR WITHOUT PELVIS) 2-3V RIGHT COMPARISON:  None. FINDINGS: SI joints are non widened. Pubic symphysis and rami appear intact. Acute nondisplaced fracture on frog-leg view, appears to involve the lower trochanter and possibly the proximal shaft. Severe right hip arthritis with bone on bone appearance, sclerosis and subarticular cysts. IMPRESSION: Acute nondisplaced right proximal femoral fracture appears to involve the  lower trochanter and possibly the proximal shaft, CT could be obtained for more thorough fracture evaluation. Electronically Signed   By: Donavan Foil M.D.   On: 08/18/2021 21:41        Scheduled Meds:  insulin aspart  0-9 Units  Subcutaneous TID WC   levothyroxine  125 mcg Oral Daily   metoprolol succinate  25 mg Oral Daily   pantoprazole  40 mg Oral Daily   Continuous Infusions:  sodium chloride 75 mL/hr at 08/19/21 0843   promethazine (PHENERGAN) injection (IM or IVPB)       LOS: 0 days    Time spent: 72min    Domenic Polite, MD [facility]   08/19/2021, 11:05 AM

## 2021-08-19 NOTE — ED Notes (Signed)
Monica Murillo [phone]. Patient daughter will be in tomorrow afternoon.

## 2021-08-19 NOTE — ED Notes (Signed)
While walking by room, I heard the pt screaming I went in the room and the pt was sitting on the edge of the bed screaming yelling and pulling off her leads and all monitoring.  Before they were off her HR went up to the 130's.  I called for Assistance and had someone help me with getting her back up in the bed.  She stated she was in pain, after getting her in the bed I got some pain medication for her and she is now resting with HR in 90's and grandson updated and at the bedside

## 2021-08-20 ENCOUNTER — Inpatient Hospital Stay (HOSPITAL_COMMUNITY): Payer: Medicare Other | Admitting: General Practice

## 2021-08-20 ENCOUNTER — Encounter (HOSPITAL_COMMUNITY): Payer: Self-pay | Admitting: Internal Medicine

## 2021-08-20 ENCOUNTER — Encounter (HOSPITAL_COMMUNITY)
Admission: EM | Disposition: A | Discharge status: Skilled Nursing Facility | Payer: Self-pay | Source: Home / Self Care | Attending: Internal Medicine

## 2021-08-20 ENCOUNTER — Inpatient Hospital Stay (HOSPITAL_COMMUNITY): Payer: Medicare Other

## 2021-08-20 DIAGNOSIS — K219 Gastro-esophageal reflux disease without esophagitis: Secondary | ICD-10-CM | POA: Diagnosis not present

## 2021-08-20 DIAGNOSIS — E039 Hypothyroidism, unspecified: Secondary | ICD-10-CM | POA: Diagnosis not present

## 2021-08-20 DIAGNOSIS — S72144D Nondisplaced intertrochanteric fracture of right femur, subsequent encounter for closed fracture with routine healing: Secondary | ICD-10-CM | POA: Diagnosis not present

## 2021-08-20 DIAGNOSIS — S72001A Fracture of unspecified part of neck of right femur, initial encounter for closed fracture: Secondary | ICD-10-CM

## 2021-08-20 HISTORY — PX: HIP FRACTURE SURGERY: SHX118

## 2021-08-20 HISTORY — PX: INTRAMEDULLARY (IM) NAIL INTERTROCHANTERIC: SHX5875

## 2021-08-20 LAB — BASIC METABOLIC PANEL
Anion gap: 11 (ref 5–15)
BUN: 11 mg/dL (ref 8–23)
CO2: 24 mmol/L (ref 22–32)
Calcium: 9.6 mg/dL (ref 8.9–10.3)
Chloride: 102 mmol/L (ref 98–111)
Creatinine, Ser: 0.73 mg/dL (ref 0.44–1.00)
GFR, Estimated: >60 mL/min (ref >60)
Glucose, Bld: 123 mg/dL — ABNORMAL HIGH (ref 70–99)
Potassium: 3.9 mmol/L (ref 3.5–5.1)
Sodium: 137 mmol/L (ref 135–145)

## 2021-08-20 LAB — CBC
HCT: 47.9 % — ABNORMAL HIGH (ref 36.0–46.0)
Hemoglobin: 15.3 g/dL — ABNORMAL HIGH (ref 12.0–15.0)
MCH: 29.3 pg (ref 26.0–34.0)
MCHC: 31.9 g/dL (ref 30.0–36.0)
MCV: 91.8 fL (ref 80.0–100.0)
Platelets: 251 10*3/uL (ref 150–400)
RBC: 5.22 MIL/uL — ABNORMAL HIGH (ref 3.87–5.11)
RDW: 13.9 % (ref 11.5–15.5)
WBC: 12.5 10*3/uL — ABNORMAL HIGH (ref 4.0–10.5)
nRBC: 0 % (ref 0.0–0.2)

## 2021-08-20 LAB — TYPE AND SCREEN
ABO/RH(D): O NEG
ABO/RH(D): O NEG
Antibody Screen: NEGATIVE
Antibody Screen: NEGATIVE

## 2021-08-20 LAB — GLUCOSE, CAPILLARY
Glucose-Capillary: 128 mg/dL — ABNORMAL HIGH (ref 70–99)
Glucose-Capillary: 154 mg/dL — ABNORMAL HIGH (ref 70–99)
Glucose-Capillary: 154 mg/dL — ABNORMAL HIGH (ref 70–99)

## 2021-08-20 LAB — SURGICAL PCR SCREEN
MRSA, PCR: NEGATIVE
Staphylococcus aureus: NEGATIVE

## 2021-08-20 IMAGING — DX DG HIP (WITH OR WITHOUT PELVIS) 2-3V*R*
3 series · 3 of 3 positions shown · non-contrast
Comparison: Right hip x-ray [DATE].

CLINICAL DATA: Postoperative.

EXAM:
DG HIP (WITH OR WITHOUT PELVIS) 2-3V RIGHT

[pelvis ap]
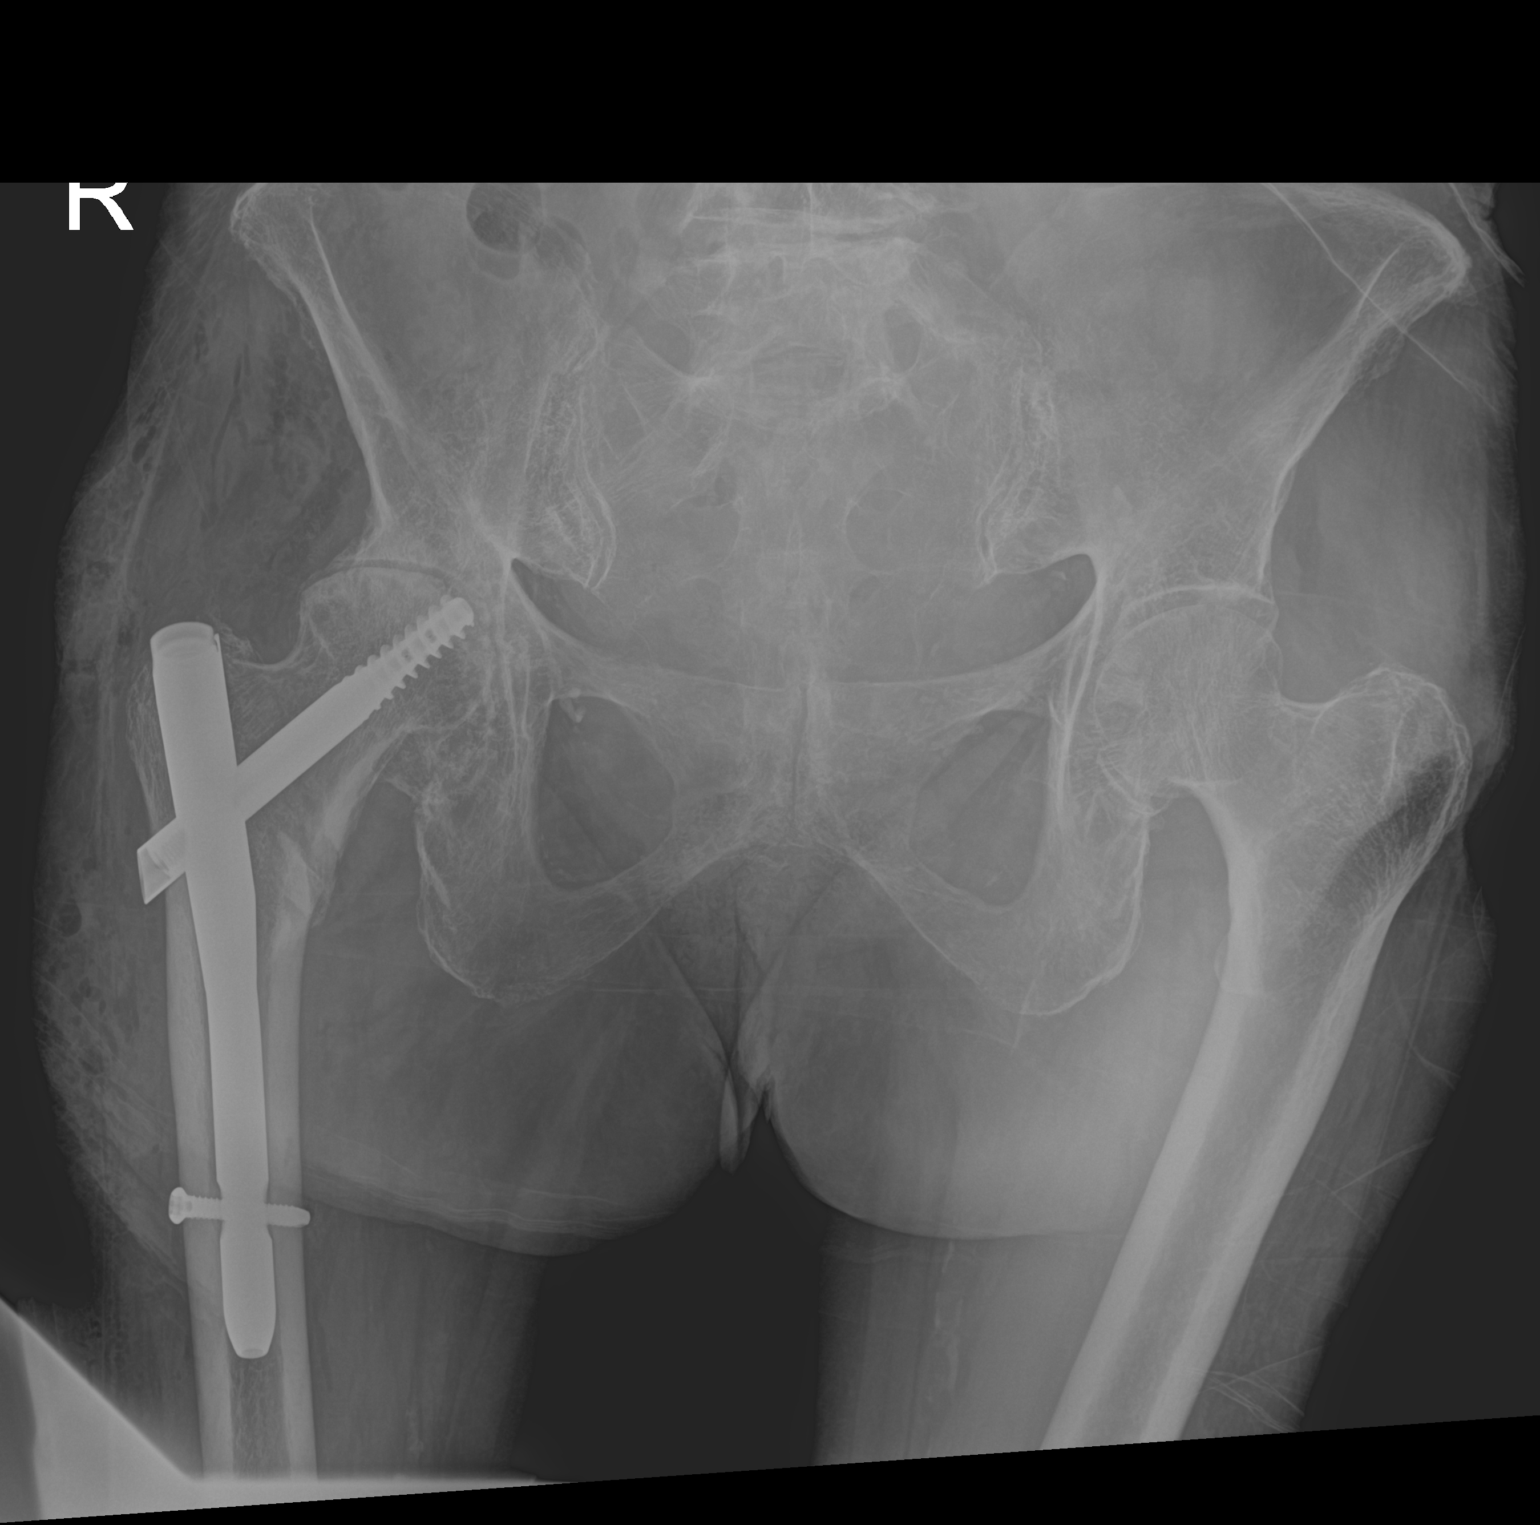

[hip ap]
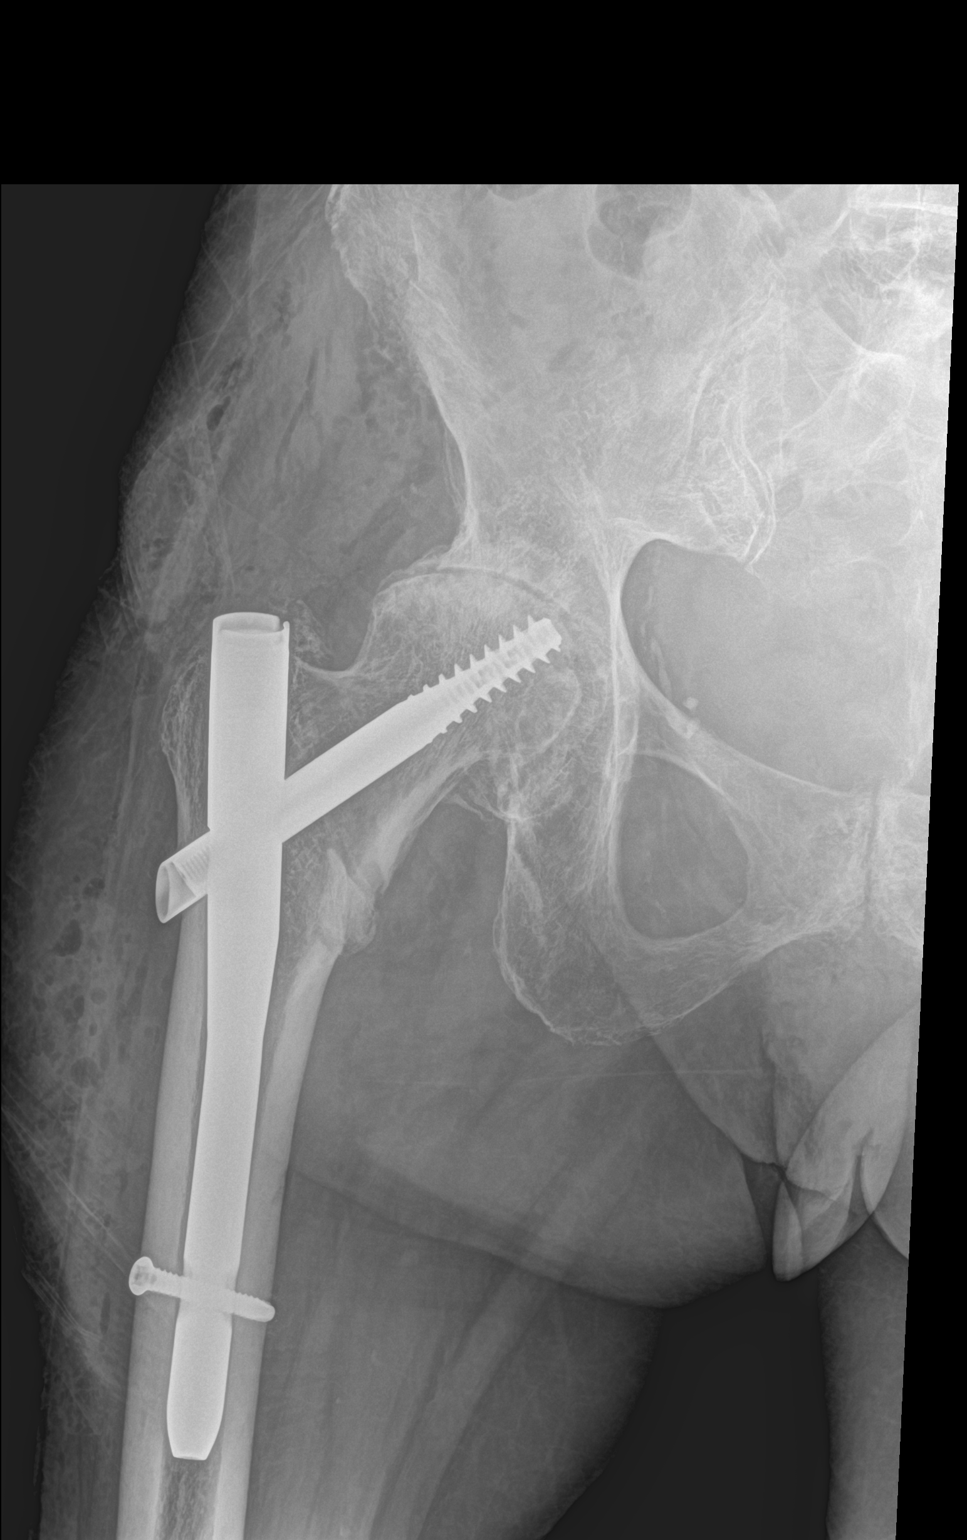

[hip lat]
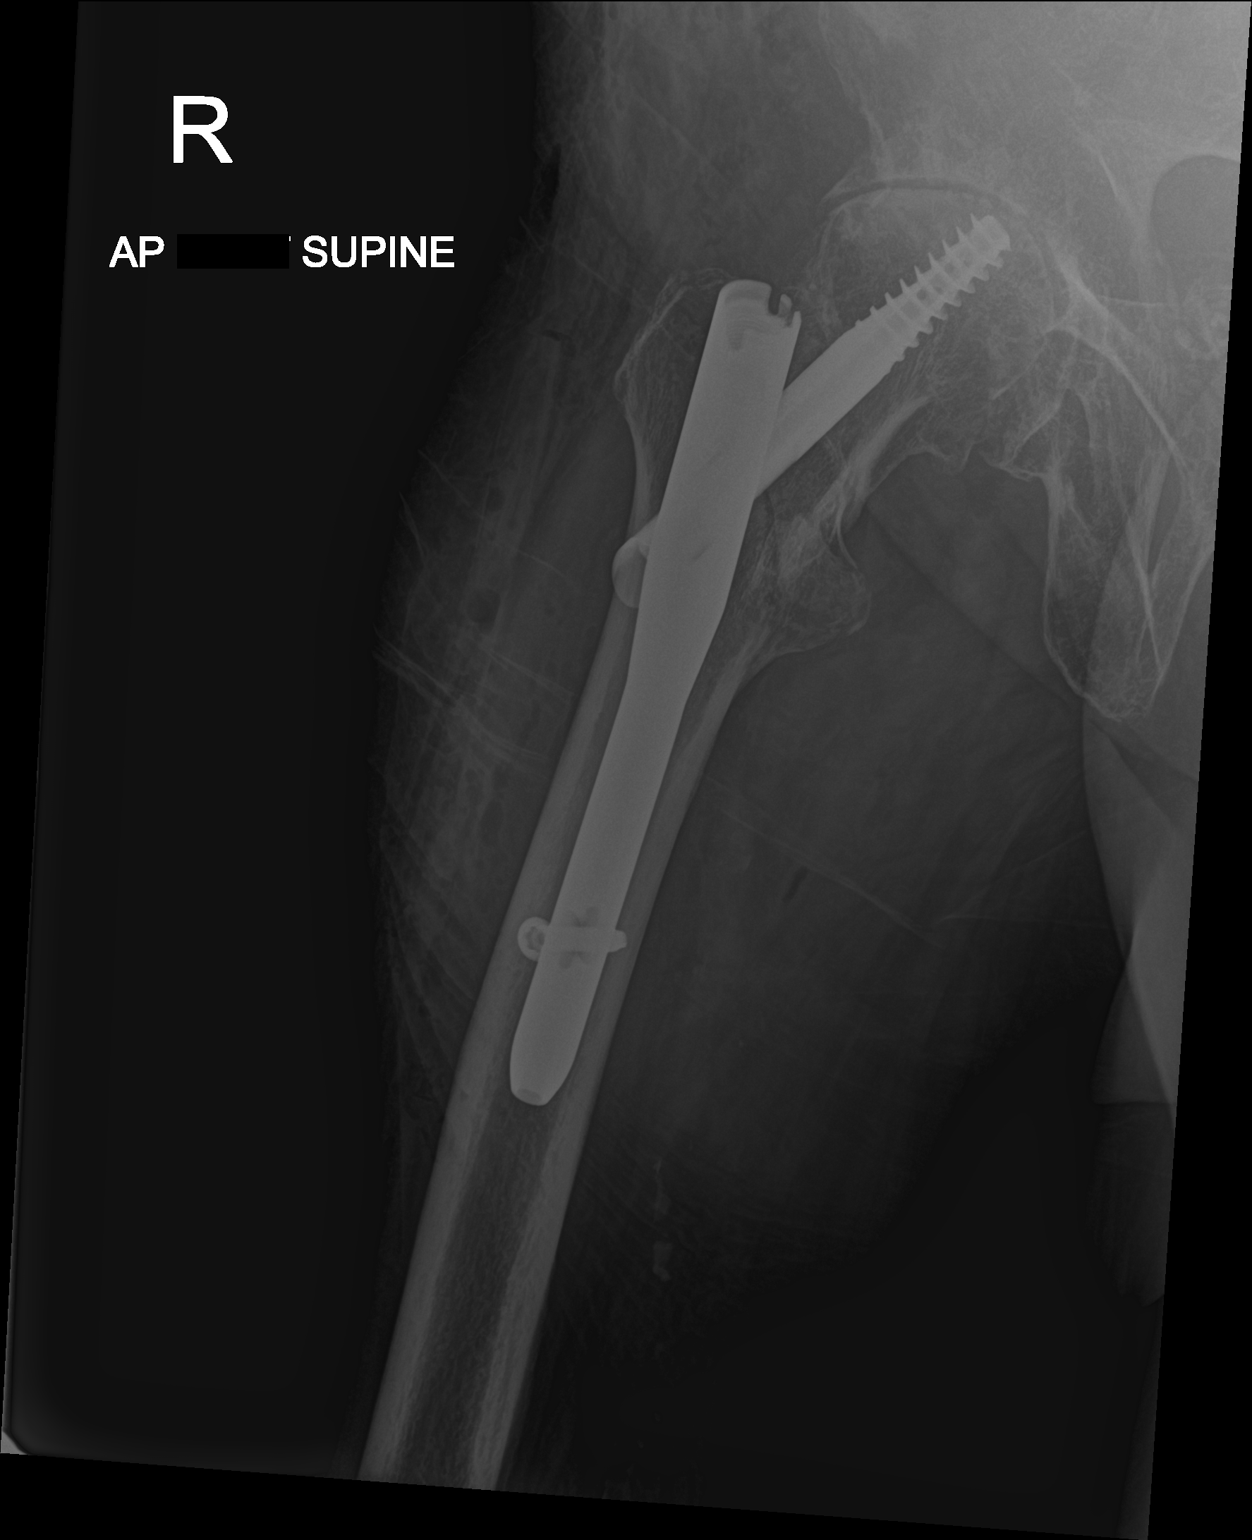

[3 of 3 positions shown; findings below may reference images not displayed]

FINDINGS: There is a new short right femoral intramedullary nail and hip screw
in place fixating a proximal femoral fracture. Alignment is
anatomic.

There are severe degenerative changes of the right hip with joint
space narrowing, sclerosis and osteophyte formation similar to the
prior study. There also moderate degenerative changes of the pubic
symphysis, unchanged. There is right lateral hip soft tissue
swelling and air compatible with recent surgery.
IMPRESSION: 1. Right hip ORIF.  Alignment is anatomic.

## 2021-08-20 IMAGING — RF DG FEMUR 2+V*R*
1 series · 4 of 4 positions shown · non-contrast
Comparison: None.

CLINICAL DATA: Surgery, elective

EXAM:
RIGHT FEMUR 2 VIEWS

[Series 1: run · 4 of 4 slices shown]
[im 1/4]
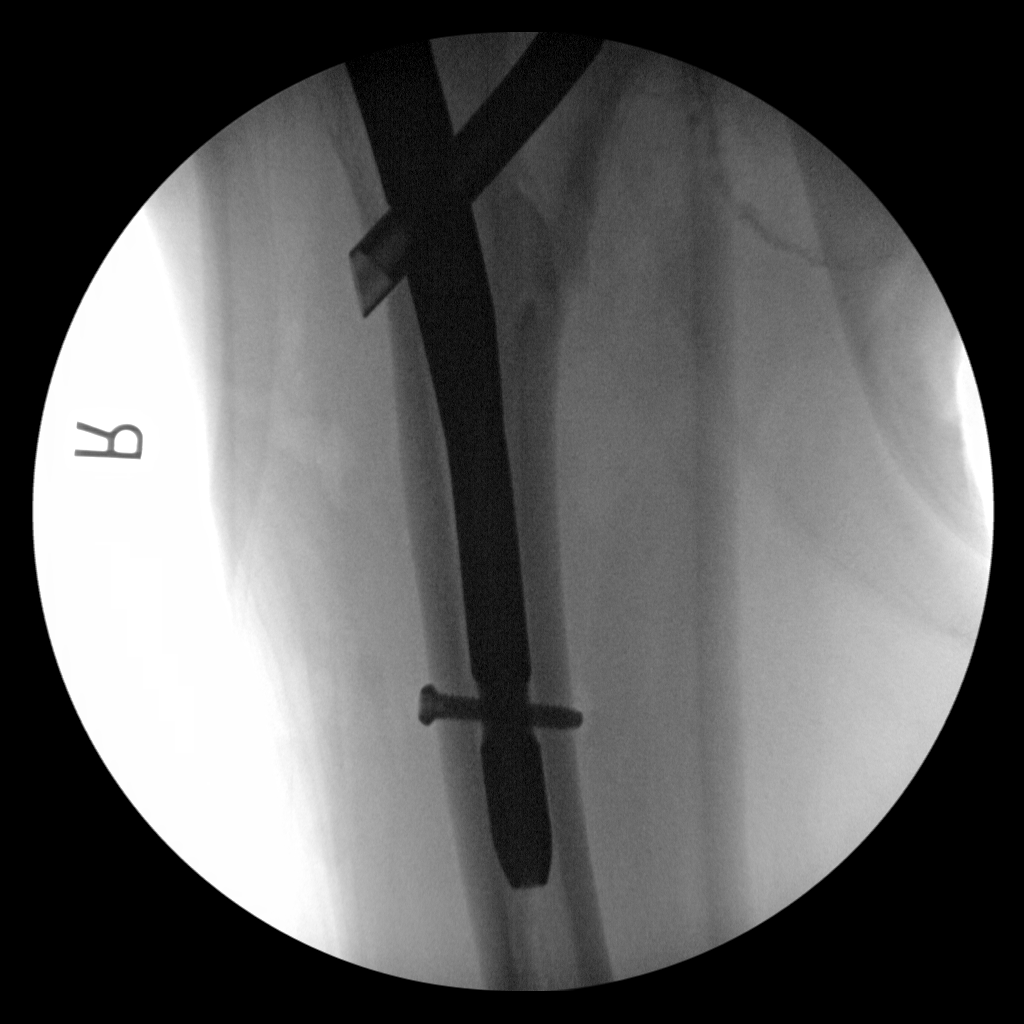
[im 2/4]
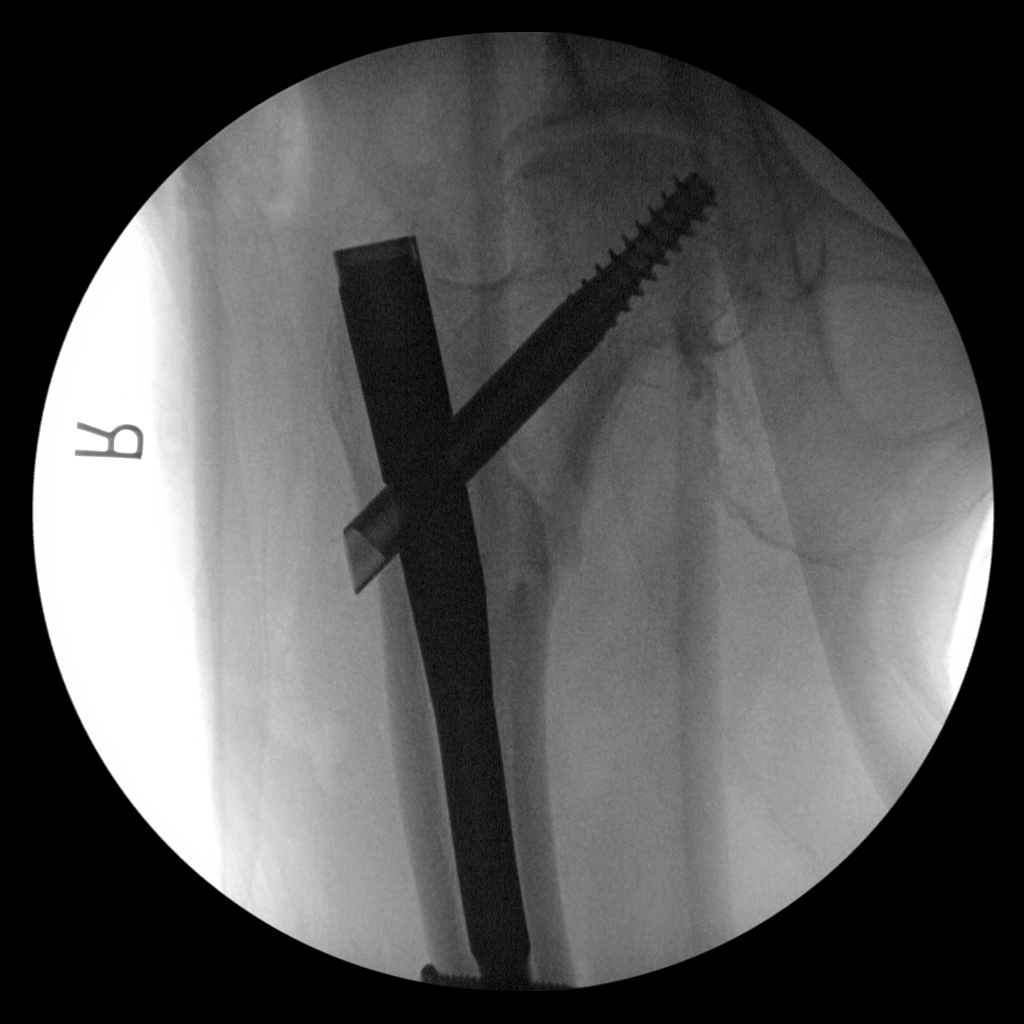
[im 3/4]
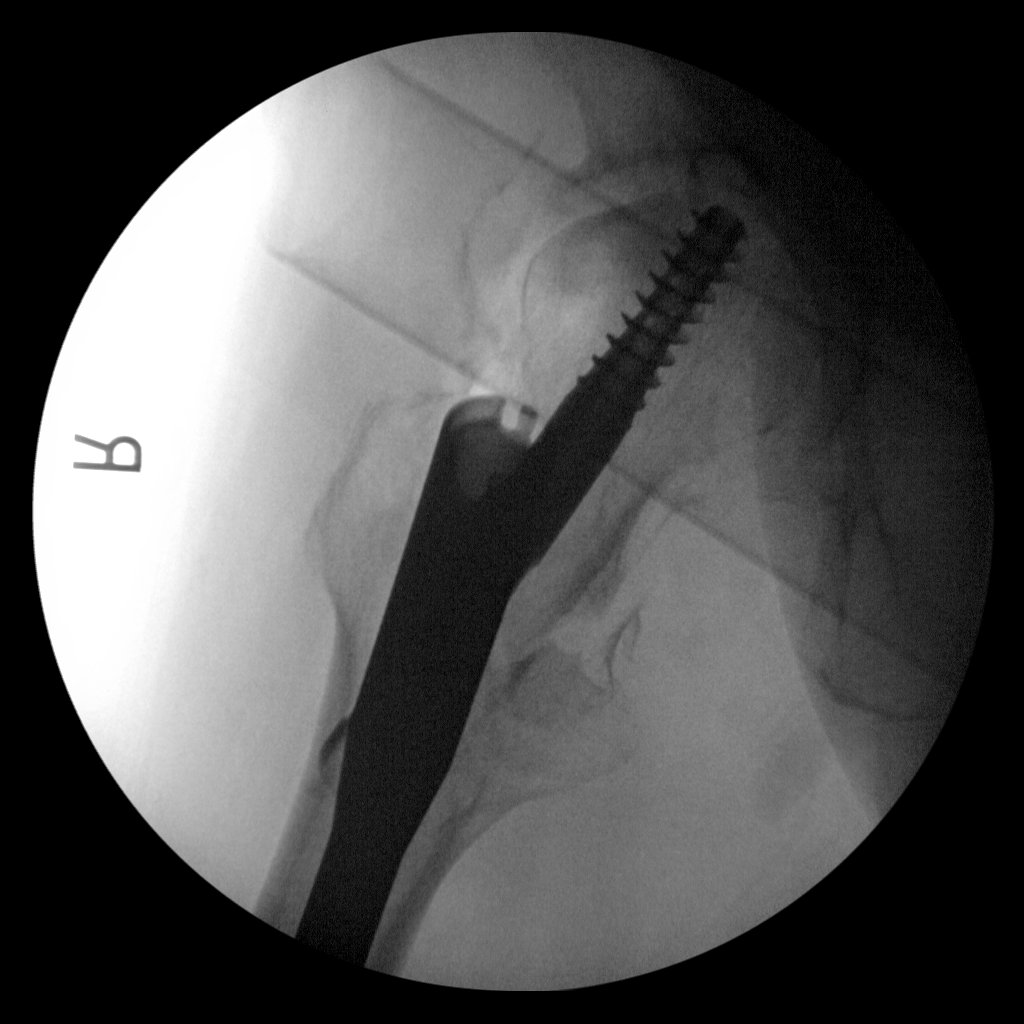
[im 4/4]
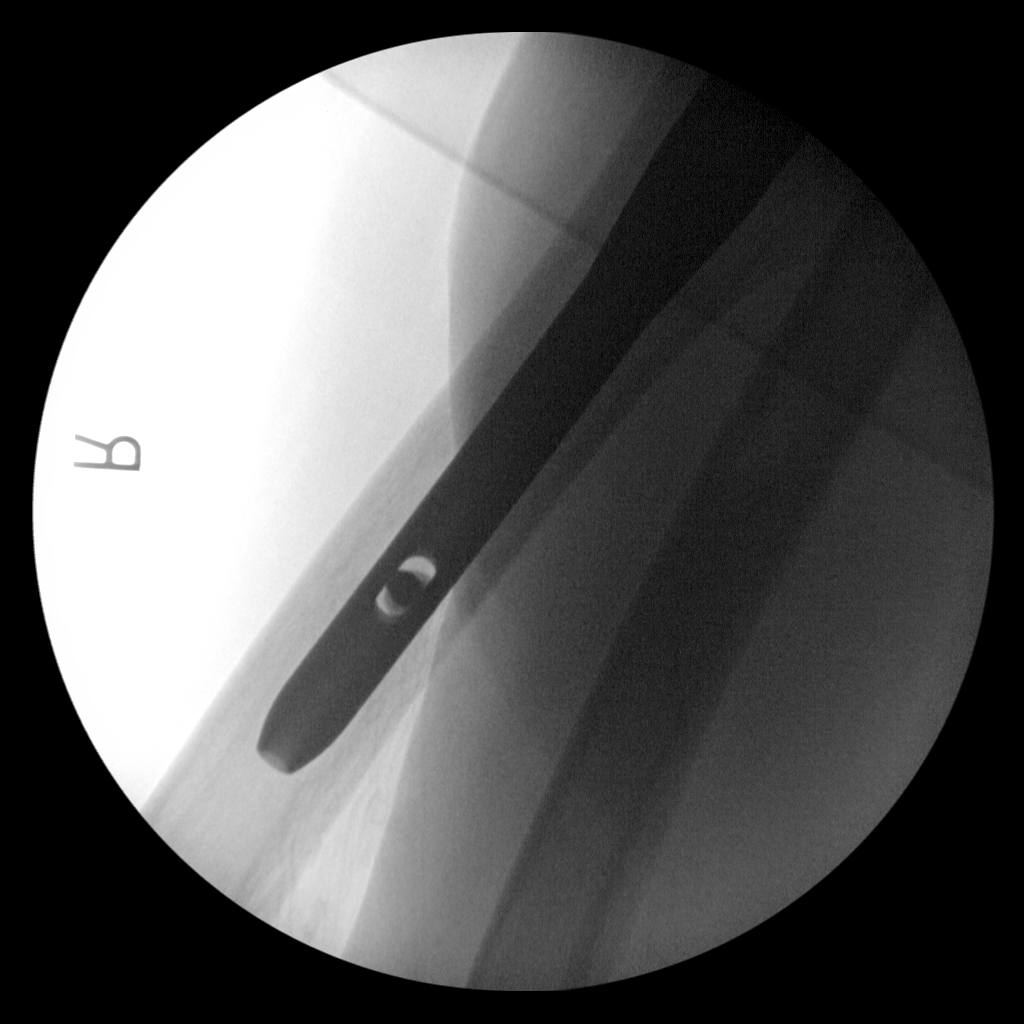

[4 of 4 positions shown; findings below may reference images not displayed]

FINDINGS: Multiple intraoperative images demonstrate right femur
intramedullary rod placement as well as a dynamic screw traversing
intertrochanteric fracture.
IMPRESSION: Right femur fracture fixation.

## 2021-08-20 SURGERY — FIXATION, FRACTURE, INTERTROCHANTERIC, WITH INTRAMEDULLARY ROD
Anesthesia: General | Laterality: Right

## 2021-08-20 MED ORDER — CEFAZOLIN SODIUM-DEXTROSE 2-4 GM/100ML-% IV SOLN
2.0000 g | Freq: Four times a day (QID) | INTRAVENOUS | Status: AC
  Administered 2021-08-20 (×2): 2 g via INTRAVENOUS
  Filled 2021-08-20 (×2): qty 100

## 2021-08-20 MED ORDER — PHENYLEPHRINE HCL-NACL 20-0.9 MG/250ML-% IV SOLN
INTRAVENOUS | Status: DC | PRN
Start: 2021-08-20 — End: 2021-08-20
  Administered 2021-08-20: 20 ug/min via INTRAVENOUS

## 2021-08-20 MED ORDER — CEFAZOLIN SODIUM-DEXTROSE 2-4 GM/100ML-% IV SOLN
INTRAVENOUS | Status: AC
  Filled 2021-08-20: qty 100

## 2021-08-20 MED ORDER — CHLORHEXIDINE GLUCONATE 0.12 % MT SOLN
OROMUCOSAL | Status: AC
  Filled 2021-08-20: qty 15

## 2021-08-20 MED ORDER — METOCLOPRAMIDE HCL 5 MG/ML IJ SOLN: 5.0000 mg | Freq: Three times a day (TID) | INTRAMUSCULAR | Status: DC | PRN

## 2021-08-20 MED ORDER — ENSURE ENLIVE PO LIQD
237.0000 mL | Freq: Two times a day (BID) | ORAL | Status: DC
  Administered 2021-08-21 – 2021-08-23 (×3): 237 mL via ORAL

## 2021-08-20 MED ORDER — HYDROCODONE-ACETAMINOPHEN 5-325 MG PO TABS
1.0000 | ORAL_TABLET | ORAL | Status: DC | PRN
  Administered 2021-08-20 – 2021-08-21 (×2): 1 via ORAL
  Administered 2021-08-21: 2 via ORAL
  Filled 2021-08-20: qty 2
  Filled 2021-08-20: qty 1
  Filled 2021-08-20: qty 2

## 2021-08-20 MED ORDER — FENTANYL CITRATE (PF) 250 MCG/5ML IJ SOLN
INTRAMUSCULAR | Status: AC
  Filled 2021-08-20: qty 5

## 2021-08-20 MED ORDER — VANCOMYCIN HCL 1000 MG IV SOLR
INTRAVENOUS | Status: AC
  Filled 2021-08-20: qty 20

## 2021-08-20 MED ORDER — ORAL CARE MOUTH RINSE: 15.0000 mL | Freq: Once | OROMUCOSAL | Status: AC

## 2021-08-20 MED ORDER — MENTHOL 3 MG MT LOZG: 1.0000 | LOZENGE | OROMUCOSAL | Status: DC | PRN

## 2021-08-20 MED ORDER — CHLORHEXIDINE GLUCONATE 0.12 % MT SOLN: 15.0000 mL | Freq: Once | OROMUCOSAL | Status: AC

## 2021-08-20 MED ORDER — ONDANSETRON HCL 4 MG/2ML IJ SOLN: 4.0000 mg | Freq: Four times a day (QID) | INTRAMUSCULAR | Status: DC | PRN

## 2021-08-20 MED ORDER — HYDROCODONE-ACETAMINOPHEN 7.5-325 MG PO TABS
1.0000 | ORAL_TABLET | ORAL | Status: DC | PRN
Start: 2021-08-20 — End: 2021-08-21
  Filled 2021-08-20: qty 1

## 2021-08-20 MED ORDER — FENTANYL CITRATE (PF) 100 MCG/2ML IJ SOLN
INTRAMUSCULAR | Status: AC
  Filled 2021-08-20: qty 2

## 2021-08-20 MED ORDER — TRANEXAMIC ACID-NACL 1000-0.7 MG/100ML-% IV SOLN
1000.0000 mg | Freq: Once | INTRAVENOUS | Status: AC
  Filled 2021-08-20: qty 100

## 2021-08-20 MED ORDER — ONDANSETRON HCL 4 MG PO TABS: 4.0000 mg | ORAL_TABLET | Freq: Four times a day (QID) | ORAL | Status: DC | PRN

## 2021-08-20 MED ORDER — PHENYLEPHRINE 40 MCG/ML (10ML) SYRINGE FOR IV PUSH (FOR BLOOD PRESSURE SUPPORT)
PREFILLED_SYRINGE | INTRAVENOUS | Status: DC | PRN
  Administered 2021-08-20 (×5): 80 ug via INTRAVENOUS

## 2021-08-20 MED ORDER — DEXAMETHASONE SODIUM PHOSPHATE 10 MG/ML IJ SOLN
INTRAMUSCULAR | Status: DC | PRN
  Administered 2021-08-20: 5 mg via INTRAVENOUS

## 2021-08-20 MED ORDER — ACETAMINOPHEN 325 MG PO TABS
325.0000 mg | ORAL_TABLET | Freq: Four times a day (QID) | ORAL | Status: DC | PRN
  Administered 2021-08-23: 650 mg via ORAL
  Filled 2021-08-20: qty 2

## 2021-08-20 MED ORDER — CHLORHEXIDINE GLUCONATE 0.12 % MT SOLN
OROMUCOSAL | Status: AC
  Administered 2021-08-20: 12:00:00 15 mL via OROMUCOSAL
  Filled 2021-08-20: qty 15

## 2021-08-20 MED ORDER — FENTANYL CITRATE (PF) 100 MCG/2ML IJ SOLN
25.0000 ug | INTRAMUSCULAR | Status: DC | PRN
  Administered 2021-08-20: 16:00:00 50 ug via INTRAVENOUS

## 2021-08-20 MED ORDER — ONDANSETRON HCL 4 MG/2ML IJ SOLN
INTRAMUSCULAR | Status: AC
  Administered 2021-08-20: 12:00:00 4 mg via INTRAVENOUS
  Filled 2021-08-20: qty 2

## 2021-08-20 MED ORDER — LIDOCAINE 2% (20 MG/ML) 5 ML SYRINGE
INTRAMUSCULAR | Status: DC | PRN
  Administered 2021-08-20: 40 mg via INTRAVENOUS

## 2021-08-20 MED ORDER — VANCOMYCIN HCL 1000 MG IV SOLR
INTRAVENOUS | Status: DC | PRN
  Administered 2021-08-20: 1000 mg via TOPICAL

## 2021-08-20 MED ORDER — DOCUSATE SODIUM 100 MG PO CAPS
100.0000 mg | ORAL_CAPSULE | Freq: Two times a day (BID) | ORAL | Status: DC
  Administered 2021-08-20 – 2021-08-23 (×6): 100 mg via ORAL
  Filled 2021-08-20 (×6): qty 1

## 2021-08-20 MED ORDER — PHENOL 1.4 % MT LIQD: 1.0000 | OROMUCOSAL | Status: DC | PRN

## 2021-08-20 MED ORDER — ONDANSETRON HCL 4 MG/2ML IJ SOLN
INTRAMUSCULAR | Status: DC | PRN
  Administered 2021-08-20: 4 mg via INTRAVENOUS

## 2021-08-20 MED ORDER — LACTATED RINGERS IV SOLN: INTRAVENOUS | Status: DC

## 2021-08-20 MED ORDER — OXYCODONE HCL 5 MG/5ML PO SOLN: 5.0000 mg | Freq: Once | ORAL | Status: DC | PRN

## 2021-08-20 MED ORDER — ACETAMINOPHEN 500 MG PO TABS
500.0000 mg | ORAL_TABLET | Freq: Four times a day (QID) | ORAL | Status: AC
  Administered 2021-08-20 – 2021-08-21 (×4): 500 mg via ORAL
  Filled 2021-08-20 (×4): qty 1

## 2021-08-20 MED ORDER — HYDROCODONE-ACETAMINOPHEN 7.5-325 MG PO TABS: 1.0000 | ORAL_TABLET | ORAL | Status: DC | PRN

## 2021-08-20 MED ORDER — OXYCODONE HCL 5 MG PO TABS: 5.0000 mg | ORAL_TABLET | Freq: Once | ORAL | Status: DC | PRN

## 2021-08-20 MED ORDER — ENOXAPARIN SODIUM 30 MG/0.3ML IJ SOSY
30.0000 mg | PREFILLED_SYRINGE | Freq: Two times a day (BID) | INTRAMUSCULAR | Status: DC
  Administered 2021-08-21: 30 mg via SUBCUTANEOUS
  Filled 2021-08-20: qty 0.3

## 2021-08-20 MED ORDER — ROCURONIUM BROMIDE 10 MG/ML (PF) SYRINGE
PREFILLED_SYRINGE | INTRAVENOUS | Status: DC | PRN
  Administered 2021-08-20: 20 mg via INTRAVENOUS
  Administered 2021-08-20: 30 mg via INTRAVENOUS
  Administered 2021-08-20: 50 mg via INTRAVENOUS

## 2021-08-20 MED ORDER — ADULT MULTIVITAMIN W/MINERALS CH
1.0000 | ORAL_TABLET | Freq: Every day | ORAL | Status: DC
  Administered 2021-08-21 – 2021-08-23 (×3): 1 via ORAL
  Filled 2021-08-20 (×3): qty 1

## 2021-08-20 MED ORDER — MORPHINE SULFATE (PF) 2 MG/ML IV SOLN
0.5000 mg | INTRAVENOUS | Status: DC | PRN
  Administered 2021-08-22 (×2): 1 mg via INTRAVENOUS
  Filled 2021-08-20 (×2): qty 1

## 2021-08-20 MED ORDER — FENTANYL CITRATE (PF) 250 MCG/5ML IJ SOLN
INTRAMUSCULAR | Status: DC | PRN
  Administered 2021-08-20 (×5): 50 ug via INTRAVENOUS

## 2021-08-20 MED ORDER — PROPOFOL 10 MG/ML IV BOLUS
INTRAVENOUS | Status: DC | PRN
  Administered 2021-08-20: 70 mg via INTRAVENOUS

## 2021-08-20 MED ORDER — SUGAMMADEX SODIUM 200 MG/2ML IV SOLN
INTRAVENOUS | Status: DC | PRN
  Administered 2021-08-20: 200 mg via INTRAVENOUS

## 2021-08-20 MED ORDER — ACETAMINOPHEN 325 MG PO TABS
650.0000 mg | ORAL_TABLET | Freq: Once | ORAL | Status: AC
  Administered 2021-08-20: 18:00:00 650 mg via ORAL
  Filled 2021-08-20: qty 2

## 2021-08-20 MED ORDER — METOCLOPRAMIDE HCL 5 MG PO TABS: 5.0000 mg | ORAL_TABLET | Freq: Three times a day (TID) | ORAL | Status: DC | PRN

## 2021-08-20 MED ORDER — ONDANSETRON HCL 4 MG/2ML IJ SOLN: 4.0000 mg | Freq: Once | INTRAMUSCULAR | Status: DC | PRN

## 2021-08-20 SURGICAL SUPPLY — 58 items
ADH SKN CLS APL DERMABOND .7 (GAUZE/BANDAGES/DRESSINGS) ×2
ADH SKN CLS LQ APL DERMABOND (GAUZE/BANDAGES/DRESSINGS) ×1
APL PRP STRL LF DISP 70% ISPRP (MISCELLANEOUS) ×1
BAG COUNTER SPONGE SURGICOUNT (BAG) ×2 IMPLANT
BAG SPNG CNTER NS LX DISP (BAG) ×1
BIT DRILL CALIBRATED 4.2 (BIT) IMPLANT
BIT DRILL CANN 16 HIP (BIT) ×1 IMPLANT
BNDG COHESIVE 6X5 TAN STRL LF (GAUZE/BANDAGES/DRESSINGS) ×2 IMPLANT
CHLORAPREP W/TINT 26 (MISCELLANEOUS) ×1 IMPLANT
COVER MAYO STAND STRL (DRAPES) ×2 IMPLANT
COVER PERINEAL POST (MISCELLANEOUS) ×2 IMPLANT
COVER SURGICAL LIGHT HANDLE (MISCELLANEOUS) ×4 IMPLANT
DECANTER SPIKE VIAL GLASS SM (MISCELLANEOUS) ×2 IMPLANT
DERMABOND ADHESIVE PROPEN (GAUZE/BANDAGES/DRESSINGS) ×1
DERMABOND ADVANCED (GAUZE/BANDAGES/DRESSINGS) ×2
DERMABOND ADVANCED .7 DNX12 (GAUZE/BANDAGES/DRESSINGS) ×2 IMPLANT
DERMABOND ADVANCED .7 DNX6 (GAUZE/BANDAGES/DRESSINGS) IMPLANT
DRAPE STERI IOBAN 125X83 (DRAPES) ×2 IMPLANT
DRILL BIT CALIBRATED 4.2 (BIT) ×2
DRSG MEPILEX BORDER 4X4 (GAUZE/BANDAGES/DRESSINGS) ×2 IMPLANT
DRSG MEPILEX BORDER 4X8 (GAUZE/BANDAGES/DRESSINGS) ×2 IMPLANT
DURAPREP 26ML APPLICATOR (WOUND CARE) ×2 IMPLANT
ELECT REM PT RETURN 9FT ADLT (ELECTROSURGICAL) ×2
ELECTRODE REM PT RTRN 9FT ADLT (ELECTROSURGICAL) ×1 IMPLANT
GLOVE SRG 8 PF TXTR STRL LF DI (GLOVE) ×2 IMPLANT
GLOVE SURG LTX SZ7.5 (GLOVE) ×4 IMPLANT
GLOVE SURG UNDER POLY LF SZ8 (GLOVE) ×4
GOWN STRL REUS W/ TWL LRG LVL3 (GOWN DISPOSABLE) ×1 IMPLANT
GOWN STRL REUS W/ TWL XL LVL3 (GOWN DISPOSABLE) ×2 IMPLANT
GOWN STRL REUS W/TWL LRG LVL3 (GOWN DISPOSABLE) ×2
GOWN STRL REUS W/TWL XL LVL3 (GOWN DISPOSABLE) ×4
GUIDEWIRE 3.2X400 (WIRE) ×1 IMPLANT
HOLDER FOLEY CATH W/STRAP (MISCELLANEOUS) ×2 IMPLANT
IMPL DEG TI CANN 11MM/130 (Orthopedic Implant) IMPLANT
IMPLANT DEG TI CANN 11MM/130 (Orthopedic Implant) ×2 IMPLANT
KIT BASIN OR (CUSTOM PROCEDURE TRAY) ×2 IMPLANT
MANIFOLD NEPTUNE II (INSTRUMENTS) ×2 IMPLANT
NS IRRIG 1000ML POUR BTL (IV SOLUTION) ×2 IMPLANT
PACK GENERAL/GYN (CUSTOM PROCEDURE TRAY) ×2 IMPLANT
PAD ARMBOARD 7.5X6 YLW CONV (MISCELLANEOUS) ×4 IMPLANT
REAMER ROD 3.8 BALL TIP 3X950 (ORTHOPEDIC DISPOSABLE SUPPLIES) ×2 IMPLANT
SCREW FENES TFNA 95 (Screw) ×1 IMPLANT
SCREW LOCK IM 5X38X125 (Screw) ×1 IMPLANT
SCREW LOCK IM 5X48 (Screw) ×1 IMPLANT
SCREW LOCK IM TI 5X30 STRL (Screw) ×1 IMPLANT
STAPLER VISISTAT 35W (STAPLE) ×2 IMPLANT
SUT MNCRL+ AB 3-0 CT1 36 (SUTURE) ×3 IMPLANT
SUT MON AB 2-0 CT1 36 (SUTURE) ×6 IMPLANT
SUT MONOCRYL AB 3-0 CT1 36IN (SUTURE) ×6
SUT PDS AB 1 CT  36 (SUTURE) ×4
SUT PDS AB 1 CT 36 (SUTURE) ×2 IMPLANT
SUT VIC AB 0 CT1 27 (SUTURE)
SUT VIC AB 0 CT1 27XBRD ANBCTR (SUTURE) IMPLANT
SUT VIC AB 1 CTB1 27 (SUTURE) ×2 IMPLANT
SUT VIC AB 2-0 CTB1 (SUTURE) ×2 IMPLANT
TOWEL GREEN STERILE (TOWEL DISPOSABLE) ×2 IMPLANT
TRAY FOLEY MTR SLVR 16FR STAT (SET/KITS/TRAYS/PACK) ×2 IMPLANT
WATER STERILE IRR 1000ML POUR (IV SOLUTION) ×2 IMPLANT

## 2021-08-20 NOTE — Op Note (Signed)
OPERATIVE NOTE  DILLAN LUNDEN female 87 y.o. 08/20/2021  PREOPERATIVE DIAGNOSIS: Right intertrochanteric hip fracture Pathologic insufficiency fracture of right hip (proximal femur) due to osteoporosis  POSTOPERATIVE DIAGNOSIS: Right intertrochanteric hip fracture Pathologic insufficiency fracture of right hip (proximal femur) due to osteoporosis  PROCEDURE(S): Cephalomedullary nail fixation of right intertrochanteric fracture (92119) Operative use of fluoroscopy for above procedure(s) (41740)   SURGEON: [name], M.D.  ASSISTANT(S): None  ANESTHESIA: Choice  FINDINGS: Preoperative Examination: Unable to tolerate any range of motion of the hip due to injury.  Unable to bear weight on hip due to injury.  Normal dorsiflexion, plantarflexion, and EHL strength and motion.  Sensation tact light touch in the superficial peroneal, deep peroneal, and tibial distributions.  2+ DP pulse.  Foot is warm and well-perfused.  Operative Findings: Comminuted right intertrochanteric hip fracture.  Appropriate alignment restored with ligamentotaxis on the Hana table and confirmed on orthogonal AP and lateral fluoroscopic images.  Appropriate intramedullary position of cephalomedullary nail implant following fixation confirmed on orthogonal AP and lateral fluoroscopic images, along with maintenance of fracture reduction.  No intra-articular penetration.  IMPLANTS: Implant Name Type Inv. Item Serial No. Manufacturer Lot No. LRB No. Used Action  IMPLANT DEG TI CANN 11MM/130 - [id number] Orthopedic Implant IMPLANT DEG TI CANN 11MM/130  [employer] [id number] Right 1 Implanted  SCREW FENES TFNA 95 - [id number] Screw SCREW FENES TFNA 95  [employer]  Right 1 Implanted  SCREW LOCK IM NL 5X30 - [id number] Screw SCREW LOCK IM NL 5X30  [employer] *[id number] Right 1 Implanted    INDICATIONS:  The patient is a 87 y.o. female who sustained a mechanical fall and presented to the ER where  she was found to have a comminuted right intertrochanteric hip fracture.  She was previously ambulatory.  Given the morbidity associated with nonoperative treatment, she was recommended to undergo fixation of this fracture with a cephalomedullary nail implant.  She understood the risks, benefits and alternatives to surgery which include but are not limited to bleeding, wound healing complications, infection, damage to surrounding structures, persistent pain, stiffness, lack of improvement, potential for subsequent arthritis or worsening of pre-existing arthritis, nonunion, malunion, and need for further surgery, as well as complications related to anesthesia, cardiovascular complications, and death.  She also understood the potential for continued pain in that there were no guarantees of acceptable outcome.  After weighing these risks the patient opted to proceed with surgery.  Of note, she does have baseline back pain and issues related to this, as well as baseline right hip and groin pain related to end-stage osteoarthritis.  I explained to the patient that during this operation the goal is to stabilize the fracture, and I would not be able to address her other issues at the present time.  These pre-existing conditions which bothers her percent for surgery will still bother her after surgery.  The point of this surgery would be to stabilize the bones to allow resumed weightbearing at previous level of function.  TECHNIQUE: Patient was identified in the preoperative holding area.  The right hip was marked by myself.  Consent was signed by myself and the patient.  No block was performed by anesthesia in the preoperative holding area.  Patient was taken to the operative suite and placed supine on the operative table.  Anesthesia was induced by the anesthesia team.  The patient was positioned appropriately for the procedure and all bony prominences were well padded.  A tourniquet was not used.  Preoperative  antibiotics were given. The extremity was prepped and draped in the usual sterile fashion and surgical timeout was performed.  Fracture was reduced on the Hana table using ligamentotaxis.  Reduction was confirmed on orthogonal AP and lateral fluoroscopic views.  Longitudinal incision was marked out extending proximally from the tip of the greater trochanter.  Skin was incised sharply.  Underlying fat and subcutaneous tissues were dissected down to the fascia with Bovie electrocautery.  Fascia was incised sharply in line with the fibers, extending proximally from the trochanteric starting point.  The trochanteric entry point was identified fluoroscopically, and at the starting wire was advanced into the intramedullary canal under fluoroscopic guidance.  The femur was cannulated at the trochanteric entry point with the entry reamer.  Starting wire entry reamer was withdrawn, and a long ball-tipped guidewire was passed on the intramedullary canal center center position under fluoroscopic guidance using orthogonal AP and lateral images.  Successive reaming was carried out up to 11.5 mm diameter to accommodate an 11 mm short cephalomedullary nail.  Nail was impacted to an appropriate position over the guidewire.  Guidewire was withdrawn.  Entry point for the head screw was identified using the triple sleeve trocar.  A small lateral skin incision was made in this region.  The deeper tissues were incised sharply to expose the lateral femoral cortex.  The triple sleeve was advanced in position of the lateral femoral cortex.  Guidewire was advanced into an appropriate tip to apex position into the subchondral bone at the femoral neck.  Appropriate guidewire position was confirmed on orthogonal AP and lateral fluoroscopic views.  Measurement was obtained of the guidewire, and the drill for the head screw was advanced along the guidewire.  Screw was advanced in position along the guidewire, and the fracture was compressed and  the implant was locked.  The head screw assembly was removed from the jig, and the entry point for the distal interlock screw was identified with the triple sleeve.  A third incision was made at this location incising the skin sharply, followed by sharp dissection of the lateral femoral cortex through the IT band.  The triple sleeve trocar was advanced onto the lateral femoral cortex and the distal interlock screw was drilled and placed.  Final x-rays confirmed appropriate position of the cephalomedullary nail implant and maintenance the fracture reduction, with good tip to apex distance, and no intra-articular penetration.  Patient was awakened from anesthesia and transferred back in stable condition.  She tolerated the procedure well.  There were no complications.  POST OPERATIVE INSTRUCTIONS: Mobility: Out of bed with PT/OT Pain control: Continue to wean/titrate to appropriate oral regimen DVT Prophylaxis: Lovenox 30 mg twice daily x6 weeks Further surgical plans: None RUE: No restrictions LUE: No restrictions RLE: Weightbearing as tolerated, range of motion as tolerated LLE: No restrictions Disposition: Per primary team Dressing care: Keep Mepilex on and dry for 5 days.  Do not allow surgical area to get wet before that.  Remove Mepilex dressing after 5 days and allow area to get wet in shower but DO NOT SUBMERGE. Follow-up: Please call [facility] [phone]) to schedule follow-op appointment for 2 weeks after surgery. I have verified that my discharge instructions and follow-up information has been entered in the Discharge Navigator in Epic.  These should automatically populate in the AVS.  Please print the AVS in its entirety and ensure that the patient or a responsible party has a complete copy of the AVS  before they are discharged.  If there are questions regarding discharge instructions or follow-up before the AVS is generated, please check the Discharge  Navigator before attempting to contact the surgeon/office.  If unsure how to access the Discharge Navigator or the information contained in the Discharge Navigator, or how to generate/print the AVS, please contact the appropriate Nurse, learning disability.   TOURNIQUET TIME: N/A  BLOOD LOSS: 200 mL         DRAINS: none         SPECIMEN: none       COMPLICATIONS:  * No complications entered in OR log *         DISPOSITION: PACU - hemodynamically stable.         CONDITION: stable   [name] M.D. Orthopaedic Surgery [facility]   Portions of the record have been created with voice recognition software.  Grammatical and punctuation errors, random word insertions, wrong-word or "sound-a-like" substitutions, pronoun errors (inaccuracies and/or substitutions), and/or incomplete sentences may have occurred due to the inherent limitations of voice recognition software.  Not all errors are caught or corrected.  Although every attempt is made to root out erroneous and incomplete transcription, the note may still not fully represent the intent or opinion of the author.  Read the chart carefully and recognize, using context, where errors/substitutions have occurred.  Any questions or concerns about the content of this note or information contained within the body of this dictation should be addressed directly with the author for clarification.

## 2021-08-20 NOTE — Discharge Instructions (Signed)
Discharge instructions for Dr. [name], M.D., Orthopaedic Surgeon, [place]:  Diet: As you were doing prior to hospitalization, unless instructed otherwise by medical team, dietary/nutrition team, etc. Shower:  Unless otherwise specified, may shower but keep the wounds dry, use an occlusive plastic wrap, NO SOAKING IN TUB.  If the bandage gets wet, change with a clean dry gauze.  After 5 days dressing(s) should be removed and wound(s) may get wet in the shower by allowing water to gently run over.  Again, no soaking in tub, and do NOT submerge for at least 2 weeks!!! Dressing:  Change your dressing 5 days after surgery.  In most cases skin glue is used and no additional dressing is necessary.  If there are sticky tapes (steri-strips) on your wounds and all the stitches are absorbable.  Leave the steri-strips in place when changing your dressings, they will peel off with time, usually 2-3 weeks. Activity:  Increase activity slowly as tolerated.  If you right leg is injured or immobilized, no driving for 6 weeks or until discussed with your surgeon.  If you have an injury or immobilization of the left lower extremity you may not operate a clutch. Please note that driving with any kind of immobilization for the upper extremity (sling, shoulder brace, splint, cast, etc.) may also be considered impaired driving and should not be attempted. Weight Bearing: AS TOLERATED  To prevent constipation: You may use over-the-counter stool softener(s) such as Colace (over the counter) 100 mg by mouth twice a day and/or Miralax (over the counter) for constipation as needed.  Drink plenty of fluids (prune juice may be helpful) and high fiber foods.  Itching:  If you experience itching with your medications, try taking only a single pain pill, or even half a pain pill at a time.  You can also use benadryl over the counter for itching or also to help with sleep.  Precautions:  If you  experience chest pain or shortness of breath - call 911 immediately for transfer to the hospital emergency department!! Medications: Please contact the clinic during office hours (Monday through Friday, 0800 to 1600) if you need a refill on any medications.  Please monitor medications and allow 24 to 48 hours to process refill request!!!!  Please note that only medications directly related to the surgery can be prescribed.  For other medications (e.g. blood pressure medicines, sleeping medicines, etc.), please contact the prescribing physician or your primary care provider. DVT Prophylaxis: Lovenox 30 mg BID x6 weeks  If you develop a fever greater that 101.1 deg F, purulent drainage from wound, increased redness or drainage from wound, or calf pain -- Call the office at [phone].

## 2021-08-20 NOTE — H&P (Signed)
PREOPERATIVE H&P  HPI: Monica Murillo is a 87 y.o. female who has presented today for surgery, with the diagnosis of right intertrochanteric femur fracture.  The various methods of treatment have been discussed with the patient and family.  After consideration of risks, benefits, and other options for treatment, the patient has consented to INTRAMEDULLARY (IM) NAIL INTERTROCHANTRIC as a surgical intervention.  The patient's history has been reviewed, patient examined, no change in status, stable for surgery.  I have reviewed the patient's chart and labs.  Questions were answered to the patient's satisfaction.  Of note, she does have baseline back pain and issues related to this, as well as baseline right hip and groin pain related to end-stage osteoarthritis.  I explained to the patient that during this operation the goal is to stabilize the fracture, and I would not be able to address her other issues at the present time.  These pre-existing conditions which bothers her percent for surgery will still bother her after surgery.  The point of this surgery would be to stabilize the bones to allow resumed weightbearing at previous level of function.  PMH: Past Medical History:  Diagnosis Date   Anemia    Heart block    History of blood transfusion    History of heart artery stent    Hypertension    Hypothyroid    Pacemaker    Pulmonary fibrosis (HCC)    Respiratory failure, chronic (HCC)    Type 2 diabetes mellitus ([place])     Home Medications Allergies  No current facility-administered medications on file prior to encounter.   Current Outpatient Medications on File Prior to Encounter  Medication Sig Dispense Refill   Acetaminophen (TYLENOL 8 HOUR ARTHRITIS PAIN PO) Take 600 mg by mouth in the morning and at bedtime.     Esomeprazole Magnesium (NEXIUM PO) Take 1 capsule by mouth in the morning.     levothyroxine (SYNTHROID) 125 MCG tablet Take 125 mcg by mouth daily.     Lutein-Zeaxanthin  20-1 MG CAPS Take 1 capsule by mouth in the morning.     METOPROLOL SUCCINATE PO Take 25 mg by mouth daily.     OXYGEN Inhale into the lungs. 2 Liters     rivaroxaban (XARELTO) 10 MG TABS tablet Take 1 tablet (10 mg total) by mouth daily. 90 tablet 3   Allergies  Allergen Reactions   Epinephrine    Lidocaine-Epinephrine    Sulfa Antibiotics Nausea And Vomiting and Rash    Sulfa and related       PSH: Past Surgical History:  Procedure Laterality Date   ABDOMINAL HYSTERECTOMY  1981   BREAST LUMPECTOMY  2011   CARDIAC PACEMAKER PLACEMENT  2018   CORONARY STENT PLACEMENT  2012     Family History Social History  Family History  Problem Relation Age of Onset   Other Mother        Enlarged Heart   Dementia Sister     Social History   Socioeconomic History   Marital status: Widowed    Spouse name: Not on file   Number of children: Not on file   Years of education: Not on file   Highest education level: Not on file  Occupational History   Not on file  Tobacco Use   Smoking status: Former    Packs/day: 1.00    Years: 50.00    Pack years: 50.00    Types: Cigarettes   Smokeless tobacco: Never  Vaping Use  Vaping Use: Never used  Substance and Sexual Activity   Alcohol use: Yes    Comment: 2-3 times a week   Drug use: Never   Sexual activity: Not on file  Other Topics Concern   Not on file  Social History Narrative   Diet:      Caffeine:Coffee      Married, if yes what year: Widow, 1959      Do you live in a house, apartment, assisted living, condo, trailer, ect: Senior Living Apartment      Is it one or more stories:       How many persons live in your home? 1      Pets: 2 Cats      Highest level or education completed: Nursing bachelors      Current/Past profession:      Exercise: No                 Type and how often: Walking         Living Will: Yes   DNR: No   POA/HPOA: Yes      Functional Status:   Do you have difficulty bathing or dressing  yourself? Yes, sometimes dressing   Do you have difficulty preparing food or eating? No    Do you have difficulty managing your medications? No    Do you have difficulty managing your finances? Yes   Do you have difficulty affording your medications? No    Social Determinants of Radio broadcast assistant Strain: Not on file  Food Insecurity: Not on file  Transportation Needs: Not on file  Physical Activity: Not on file  Stress: Not on file  Social Connections: Not on file     Review of Systems: MSK: As noted per HPI above GI: No current Nausea/vomiting ENT: Denies sore throat, epistaxis CV: Denies chest pain Resp: No current shortness of breath  Other than mentioned above, there are no Constitutional, Neurological, Psychiatric, ENT, Ophthalmological, Cardiovascular, Respiratory, GI, GU, Musculoskeletal, Integumentary, Lymphatic, Endocrine or Allergic issues.   Physical Examination: CV: Normal distal pulses Lungs: Unlabored respirations RLE: Unable to tolerate any range of motion of the hip due to injury.  Unable to bear weight on hip due to injury.  Normal dorsiflexion, plantarflexion, and EHL strength and motion.  Sensation tact light touch in the superficial peroneal, deep peroneal, and tibial distributions.  2+ DP pulse.  Foot is warm and well-perfused.  Assessment/Plan: INTRAMEDULLARY (IM) NAIL INTERTROCHANTRIC    [name] M.D. Orthopaedic Surgery [facility]  Review of this patient's medications prescribed by other providers does not in any way constitute an endorsement by this clinician of their use, indications, dosage, route, efficacy, interactions, or other clinical parameters.  Portions of the record have been created with voice recognition software.  Grammatical and punctuation errors, random word insertions, wrong-word or "sound-a-like" substitutions, pronoun errors (inaccuracies and/or substitutions), and/or incomplete  sentences may have occurred due to the inherent limitations of voice recognition software.  Not all errors are caught or corrected.  Although every attempt is made to root out erroneous and incomplete transcription, the note may still not fully represent the intent or opinion of the author.  Read the chart carefully and recognize, using context, where errors/substitutions have occurred.  Any questions or concerns about the content of this note or information contained within the body of this dictation should be addressed directly with the author for clarification.

## 2021-08-20 NOTE — Anesthesia Postprocedure Evaluation (Signed)
Anesthesia Post Note  Patient: Monica Murillo  Procedure(s) Performed: INTRAMEDULLARY (IM) NAIL INTERTROCHANTRIC (Right)     Patient location during evaluation: PACU Anesthesia Type: General Level of consciousness: awake and alert Pain management: pain level controlled Vital Signs Assessment: post-procedure vital signs reviewed and stable Respiratory status: spontaneous breathing, nonlabored ventilation, respiratory function stable and patient connected to nasal cannula oxygen Cardiovascular status: stable and blood pressure returned to baseline Anesthetic complications: no   No notable events documented.  Last Vitals:  Vitals:   08/20/21 1620 08/20/21 1630  BP: (!) 146/65 (!) 158/70  Pulse: 73   Resp: 15   Temp:  36.4 C  SpO2: 99%     Last Pain:  Vitals:   08/20/21 1620  TempSrc:   PainSc: [name]

## 2021-08-20 NOTE — Transfer of Care (Signed)
Immediate Anesthesia Transfer of Care Note  Patient: Monica Murillo  Procedure(s) Performed: INTRAMEDULLARY (IM) NAIL INTERTROCHANTRIC (Right)  Patient Location: PACU  Anesthesia Type:General  Level of Consciousness: awake and drowsy  Airway & Oxygen Therapy: Patient Spontanous Breathing and Patient connected to nasal cannula oxygen  Post-op Assessment: Report given to RN and Post -op Vital signs reviewed and stable  Post vital signs: Reviewed and stable  Last Vitals:  Vitals Value Taken Time  BP 126/65 08/20/21 1549  Temp    Pulse 69 08/20/21 1553  Resp 11 08/20/21 1553  SpO2 100 % 08/20/21 1553  Vitals shown include unvalidated device data.  Last Pain:  Vitals:   08/20/21 1216  TempSrc:   PainSc: 10-Worst pain ever      Patients Stated Pain Goal: 0 (82/64/15 8309)  Complications: No notable events documented.

## 2021-08-20 NOTE — Progress Notes (Signed)
PROGRESS NOTE    Monica Murillo  VEH:[id number] DOB: 01/30/1933 DOA: 08/18/2021 PCP: Yvonna Alanis, NP  Brief Narrative: [name] with history of CAD, with history of PCI and stents, dual-chamber pacemaker for complete heart block, paroxysmal atrial fibrillation on Xarelto, interstitial lung disease/pulmonary fibrosis on 2 L O2 recently moved to [place] to live closer to her daughter presented to the ED from [facility] independent living facility after mechanical fall, fell while attempting to sit backwards onto a wheelchair, landed on her right hip, was unable to bear weight thereafter. -In the ED blood pressure was elevated in the 200s, labs noted WBC of 12.4, chest x-ray noted pulmonary vascular congestion, hip x-ray noted right proximal femur fracture   Assessment & Plan:   Right proximal femur fracture Mechanical fall -Orthopedics consulting, plan for surgery today -She is at least moderate cardiac risk for an intermediate risk procedure, continue beta-blocker -Will attempt better control of blood pressure -Pain control -Holding Xarelto, will resume this postop  Paroxysmal atrial fibrillation Complete heart block > Status post pacemaker - Holding home Xarelto in the setting of likely upcoming surgery -Continue metoprolol  Uncontrolled hypertension -Still poorly controlled but improving, on current regimen of metoprolol, amlodipine and hydralazine -PRN labetalol   CAD > History of MI listed in chart - Continue home metoprolol  Diabetes -Diet controlled, sliding scale insulin   Pulmonary fibrosis/ILD Chronic respiratory failure > Chest x-ray changes secondary to ILD - Continue home 2 L of oxygen   Hypothyroidism - Continue home Synthroid  GERD - Continue PPI   DVT prophylaxis:      SCDs  Code Status:                         DNR Family Communication patient in detail, no family at bedside Disposition Plan: Anticipate need for rehab Status is: Full inpatient  because: Severity of illness  Consultants:  Orthopedics  Procedures:   Antimicrobials:    Subjective: -Feels better today, still continues to have pain in her right hip  Objective: Vitals:   08/19/21 1700 08/19/21 1816 08/19/21 2138 08/20/21 0739  BP: (!) 198/93 (!) 177/80 (!) 159/103   Pulse: 86 74 99   Resp: 20  17   Temp: 98.1 F (36.7 C)  98.4 F (36.9 C) 98.2 F (36.8 C)  TempSrc: Oral  Oral   SpO2: 98%  94%    No intake or output data in the 24 hours ending 08/20/21 1113 There were no vitals filed for this visit.  Examination:  General exam: Chronically ill elderly female sitting up in bed, AAOx3, no distress CVS: S1-S2, regular rate rhythm Lungs: Fine bilateral rales Abdomen: Soft, nontender, bowel sounds present Extremities: No edema, intact distal pulses Skin: No rashes Psychiatry: Mood & affect appropriate.     Data Reviewed:   CBC: Recent Labs  Lab 08/18/21 2114 08/20/21 0240  WBC 12.4* 12.5*  NEUTROABS 9.3*  --   HGB 13.3 15.3*  HCT 43.0 47.9*  MCV 94.5 91.8  PLT 208 836   Basic Metabolic Panel: Recent Labs  Lab 08/18/21 2114 08/20/21 0240  NA 136 137  K 4.3 3.9  CL 104 102  CO2 20* 24  GLUCOSE 196* 123*  BUN 17 11  CREATININE 0.76 0.73  CALCIUM 8.8* 9.6   GFR: Estimated Creatinine Clearance: 36.3 mL/min (by C-G formula based on SCr of 0.73 mg/dL). Liver Function Tests: Recent Labs  Lab 08/18/21 2114  AST 28  ALT 21  ALKPHOS 82  BILITOT 1.0  PROT 6.1*  ALBUMIN 3.9   No results for input(s): LIPASE, AMYLASE in the last 168 hours. No results for input(s): AMMONIA in the last 168 hours. Coagulation Profile: Recent Labs  Lab 08/18/21 2154  INR 1.1   Cardiac Enzymes: No results for input(s): CKTOTAL, CKMB, CKMBINDEX, TROPONINI in the last 168 hours. BNP (last 3 results) No results for input(s): PROBNP in the last 8760 hours. HbA1C: No results for input(s): HGBA1C in the last 72 hours. CBG: Recent Labs  Lab  08/19/21 0839 08/19/21 1154 08/19/21 1735 08/19/21 2135 08/20/21 0634  GLUCAP 151* 140* 134* 132* 128*   Lipid Profile: No results for input(s): CHOL, HDL, LDLCALC, TRIG, CHOLHDL, LDLDIRECT in the last 72 hours. Thyroid Function Tests: No results for input(s): TSH, T4TOTAL, FREET4, T3FREE, THYROIDAB in the last 72 hours. Anemia Panel: No results for input(s): VITAMINB12, FOLATE, FERRITIN, TIBC, IRON, RETICCTPCT in the last 72 hours. Urine analysis: No results found for: COLORURINE, APPEARANCEUR, LABSPEC, PHURINE, GLUCOSEU, HGBUR, BILIRUBINUR, KETONESUR, PROTEINUR, UROBILINOGEN, NITRITE, LEUKOCYTESUR Sepsis Labs: @LABRCNTIP (procalcitonin:4,lacticidven:4)  ) Recent Results (from the past 240 hour(s))  Resp Panel by RT-PCR (Flu A&B, Covid) Nasopharyngeal Swab     Status: None   Collection Time: 08/18/21  9:56 PM   Specimen: Nasopharyngeal Swab; Nasopharyngeal(NP) swabs in vial transport medium  Result Value Ref Range Status   SARS Coronavirus 2 by RT PCR NEGATIVE NEGATIVE Final    Comment: (NOTE) SARS-CoV-2 target nucleic acids are NOT DETECTED.  The SARS-CoV-2 RNA is generally detectable in upper respiratory specimens during the acute phase of infection. The lowest concentration of SARS-CoV-2 viral copies this assay can detect is 138 copies/mL. A negative result does not preclude SARS-Cov-2 infection and should not be used as the sole basis for treatment or other patient management decisions. A negative result may occur with  improper specimen collection/handling, submission of specimen other than nasopharyngeal swab, presence of viral mutation(s) within the areas targeted by this assay, and inadequate number of viral copies(<138 copies/mL). A negative result must be combined with clinical observations, patient history, and epidemiological information. The expected result is Negative.  Fact Sheet for Patients:  EntrepreneurPulse.com.au  Fact Sheet for  Healthcare Providers:  IncredibleEmployment.be  This test is no t yet approved or cleared by the [place] FDA and  has been authorized for detection and/or diagnosis of SARS-CoV-2 by FDA under an Emergency Use Authorization (EUA). This EUA will remain  in effect (meaning this test can be used) for the duration of the COVID-19 declaration under Section 564(b)(1) of the Act, 21 U.S.C.section 360bbb-3(b)(1), unless the authorization is terminated  or revoked sooner.       Influenza A by PCR NEGATIVE NEGATIVE Final   Influenza B by PCR NEGATIVE NEGATIVE Final    Comment: (NOTE) The Xpert Xpress SARS-CoV-2/FLU/RSV plus assay is intended as an aid in the diagnosis of influenza from Nasopharyngeal swab specimens and should not be used as a sole basis for treatment. Nasal washings and aspirates are unacceptable for Xpert Xpress SARS-CoV-2/FLU/RSV testing.  Fact Sheet for Patients: EntrepreneurPulse.com.au  Fact Sheet for Healthcare Providers: IncredibleEmployment.be  This test is not yet approved or cleared by the [place] FDA and has been authorized for detection and/or diagnosis of SARS-CoV-2 by FDA under an Emergency Use Authorization (EUA). This EUA will remain in effect (meaning this test can be used) for the duration of the COVID-19 declaration under Section 564(b)(1) of the Act, 21 U.S.C. section 360bbb-3(b)(1),  unless the authorization is terminated or revoked.  Performed at [facility], [place] [address] [place], [place] [address]          Radiology Studies: DG Chest 1 View  Result Date: 08/18/2021 CLINICAL DATA:  [place], hip fracture, preoperative evaluation EXAM: CHEST  1 VIEW COMPARISON:  None. FINDINGS: Single frontal view of the chest demonstrates dual lead pacer overlying right chest. Cardiac silhouette is enlarged. Marked ectasia and atherosclerosis of the thoracic aorta. Mild central vascular  congestion. Diffuse interstitial prominence could reflect interstitial edema or chronic lung scarring. No acute airspace disease, effusion, or pneumothorax. IMPRESSION: 1. Central vascular congestion and diffuse interstitial prominence, consistent with interstitial edema or chronic scarring. No acute airspace disease. Electronically Signed   By: Randa Ngo M.D.   On: 08/18/2021 22:27   CT HEAD WO CONTRAST  Result Date: 08/18/2021 CLINICAL DATA:  Head trauma. EXAM: CT HEAD WITHOUT CONTRAST TECHNIQUE: Contiguous axial images were obtained from the base of the skull through the vertex without intravenous contrast. COMPARISON:  None. FINDINGS: Brain: Moderate age-related atrophy and chronic microvascular ischemic changes. Old area of infarct with associated dystrophic calcification in the superior right cerebellum. There is no acute intracranial hemorrhage. No mass effect or midline shift. No extra-axial fluid collection. Vascular: No hyperdense vessel or unexpected calcification. Skull: Normal. Negative for fracture or focal lesion. Sinuses/Orbits: No acute finding. Other: None IMPRESSION: 1. No acute intracranial pathology. 2. Moderate age-related atrophy and chronic microvascular ischemic changes. Old right cerebellar infarct. Electronically Signed   By: Anner Crete M.D.   On: 08/18/2021 23:38   CT Hip Right Wo Contrast  Result Date: 08/18/2021 CLINICAL DATA:  Fall. Hip trauma with fracture suspected. Pain with movement. EXAM: CT OF THE RIGHT HIP WITHOUT CONTRAST TECHNIQUE: Multidetector CT imaging of the right hip was performed according to the standard protocol. Multiplanar CT image reconstructions were also generated. COMPARISON:  Right hip radiographs 08/18/2021 FINDINGS: Bones/Joint/Cartilage Diffuse bone demineralization. Comminuted inter trochanteric fractures of the proximal right femur with fracture lines extending through the trochanteric region to the greater and lesser trochanters. Mild  impaction without significant displacement of fracture fragments. No additional fractures demonstrated in the visualized acetabulum or right hemipelvis. Severe degenerative changes in the right hip with joint space narrowing, subcortical cyst formation, and prominent osteophyte formation. Ligaments Suboptimally assessed by CT. Muscles and Tendons Fatty atrophy of the right hip musculature. No significant intramuscular hematoma. Soft tissues Visualized pelvic organs appear intact.  Vascular calcifications. IMPRESSION: Comminuted intertrochanteric fractures of the proximal right femur without significant displacement. Severe degenerative changes in the right hip without dislocation. Electronically Signed   By: Lucienne Capers M.D.   On: 08/18/2021 23:44   DG Knee Complete 4 Views Right  Result Date: 08/18/2021 CLINICAL DATA:  [place], hip fracture, pain EXAM: RIGHT KNEE - COMPLETE 4+ VIEW COMPARISON:  08/18/2021 FINDINGS: Frontal, bilateral oblique, and cross-table lateral views of the right knee are obtained. No fracture, subluxation, or dislocation. There is mild 3 compartmental osteoarthritis. No joint effusion. Soft tissues are unremarkable. IMPRESSION: 1. Mild osteoarthritis.  No acute fracture. Electronically Signed   By: Randa Ngo M.D.   On: 08/18/2021 22:28   DG Hip Unilat With Pelvis 2-3 Views Right  Result Date: 08/18/2021 CLINICAL DATA:  Fall with hip pain EXAM: DG HIP (WITH OR WITHOUT PELVIS) 2-3V RIGHT COMPARISON:  None. FINDINGS: SI joints are non widened. Pubic symphysis and rami appear intact. Acute nondisplaced fracture on frog-leg view, appears to involve the  lower trochanter and possibly the proximal shaft. Severe right hip arthritis with bone on bone appearance, sclerosis and subarticular cysts. IMPRESSION: Acute nondisplaced right proximal femoral fracture appears to involve the lower trochanter and possibly the proximal shaft, CT could be obtained for more thorough fracture  evaluation. Electronically Signed   By: Donavan Foil M.D.   On: 08/18/2021 21:41        Scheduled Meds:  amLODipine  10 mg Oral Daily   chlorhexidine  60 mL Topical Once   [START ON 08/21/2021] feeding supplement  237 mL Oral BID BM   hydrALAZINE  50 mg Oral TID   insulin aspart  0-9 Units Subcutaneous TID WC   levothyroxine  125 mcg Oral Daily   metoprolol succinate  25 mg Oral Daily   [START ON 08/21/2021] multivitamin with minerals  1 tablet Oral Daily   pantoprazole  40 mg Oral Daily   povidone-iodine  2 application Topical Once   Continuous Infusions:   ceFAZolin (ANCEF) IV     promethazine (PHENERGAN) injection (IM or IVPB)       LOS: 1 day    Time spent: 49min  Domenic Polite, MD [facility]   08/20/2021, 11:13 AM

## 2021-08-20 NOTE — Anesthesia Procedure Notes (Signed)
Procedure Name: Intubation Date/Time: 08/20/2021 1:12 PM Performed by: Dorann Lodge, CRNA Pre-anesthesia Checklist: Patient identified, Emergency Drugs available, Suction available and Patient being monitored Patient Re-evaluated:Patient Re-evaluated prior to induction Oxygen Delivery Method: Circle System Utilized Preoxygenation: Pre-oxygenation with 100% oxygen Induction Type: IV induction Ventilation: Mask ventilation without difficulty Laryngoscope Size: Mac and 3 Grade View: Grade I Tube type: Oral Tube size: 7.0 mm Number of attempts: 1 Airway Equipment and Method: Stylet Placement Confirmation: ETT inserted through vocal cords under direct vision, positive ETCO2 and breath sounds checked- equal and bilateral Secured at: 21 cm Tube secured with: Tape Dental Injury: Teeth and Oropharynx as per pre-operative assessment

## 2021-08-20 NOTE — Progress Notes (Signed)
Initial Nutrition Assessment  DOCUMENTATION CODES:   Not applicable  INTERVENTION:  Once diet is advanced to at least full liquids: -Ensure Enlive po BID, each supplement provides 350 kcal and 20 grams of protein -MVI with minerals daily  NUTRITION DIAGNOSIS:   Increased nutrient needs related to post-op healing, hip fracture as evidenced by estimated needs.  GOAL:   Patient will meet greater than or equal to 90% of their needs  MONITOR:   PO intake, Supplement acceptance, Diet advancement, Labs, Weight trends, I & O's, Skin  REASON FOR ASSESSMENT:   Consult Hip fracture protocol  ASSESSMENT:   [name] with history of CAD, with history of PCI and stents, dual-chamber pacemaker for complete heart block, paroxysmal atrial fibrillation on Xarelto, interstitial lung disease/pulmonary fibrosis on 2 L O2 recently moved to [place] to live closer to her daughter presented to the ED from [facility] living facility after mechanical fall, fell while attempting to sit backwards onto a wheelchair, landed on her right hip, was unable to bear weight thereafter. Admitted with R femur fx.  Pt out of room at time of RD visit. Per RN, pt is having IMN today due to knee fx.   Weight history reviewed. Stable per available weight readings, though noted pt only has weight history for December 2022.   PO Intake: none documented   Will attempt to obtain more detailed diet/wt hx upon follow-up  Medications: Scheduled Meds:  amLODipine  10 mg Oral Daily   chlorhexidine  60 mL Topical Once   hydrALAZINE  50 mg Oral TID   insulin aspart  0-9 Units Subcutaneous TID WC   levothyroxine  125 mcg Oral Daily   metoprolol succinate  25 mg Oral Daily   pantoprazole  40 mg Oral Daily   povidone-iodine  2 application Topical Once  Continuous Infusions:   ceFAZolin (ANCEF) IV     promethazine (PHENERGAN) injection (IM or IVPB)      Labs: Recent Labs  Lab 08/18/21 2114 08/20/21 0240   NA 136 137  K 4.3 3.9  CL 104 102  CO2 20* 24  BUN 17 11  CREATININE 0.76 0.73  CALCIUM 8.8* 9.6  GLUCOSE 196* 123*  CBGs: 128-140 x24 hours    NUTRITION - FOCUSED PHYSICAL EXAM: Unable to perform at this time. Will attempt at follow-up.   Diet Order:   Diet Order             Diet NPO time specified  Diet effective midnight                   EDUCATION NEEDS:   No education needs have been identified at this time  Skin:  Skin Assessment: Reviewed RN Assessment  Last BM:  12/28  Height:   Ht Readings from Last 1 Encounters:  08/10/21 5' (1.524 m)    Weight:   Wt Readings from Last 1 Encounters:  08/10/21 52.8 kg    BMI:  There is no height or weight on file to calculate BMI.  Estimated Nutritional Needs:   Kcal:  1300-1500  Protein:  65-75 grams  Fluid:  >1.3L     Monica Murillo., MS, RD, LDN (she/her/hers) RD pager number and weekend/on-call pager number located in [place].

## 2021-08-20 NOTE — Anesthesia Preprocedure Evaluation (Addendum)
Anesthesia Evaluation  Patient identified by MRN, date of birth, ID band Patient awake    Reviewed: Allergy & Precautions, NPO status , Patient's Chart, lab work & pertinent test results, reviewed documented beta blocker date and time   History of Anesthesia Complications Negative for: history of anesthetic complications  Airway Mallampati: II  TM Distance: >3 FB Neck ROM: Full    Dental  (+) Dental Advisory Given   Pulmonary former smoker,   Pulmonary fibrosis on 2L O2     Pulmonary exam normal        Cardiovascular hypertension, Pt. on medications and Pt. on home beta blockers + CAD and + Cardiac Stents  Normal cardiovascular exam+ dysrhythmias + pacemaker      Neuro/Psych negative neurological ROS  negative psych ROS   GI/Hepatic Neg liver ROS, GERD  Medicated,  Endo/Other  diabetes, Type 2Hypothyroidism   Renal/GU negative Renal ROS     Musculoskeletal negative musculoskeletal ROS (+)   Abdominal   Peds  Hematology  On xarelto    Anesthesia Other Findings   Reproductive/Obstetrics                            Anesthesia Physical Anesthesia Plan  ASA: 4  Anesthesia Plan: General   Post-op Pain Management: Tylenol PO (pre-op)   Induction: Intravenous  PONV Risk Score and Plan: 3 and Treatment may vary due to age or medical condition, Ondansetron and Propofol infusion  Airway Management Planned: Oral ETT  Additional Equipment: None  Intra-op Plan:   Post-operative Plan: Extubation in OR  Informed Consent: I have reviewed the patients History and Physical, chart, labs and discussed the procedure including the risks, benefits and alternatives for the proposed anesthesia with the patient or authorized representative who has indicated his/her understanding and acceptance.   Patient has DNR.  Discussed DNR with patient.   Dental advisory given  Plan Discussed with: CRNA  and Anesthesiologist  Anesthesia Plan Comments: (Discussed DNR with patient. She is agreeable to intubation as indicated by the procedure, IV fluids, and vasopressor medications. She does not want chest compressions under any circumstance. Partial suspension of DNR.)       Anesthesia Quick Evaluation

## 2021-08-21 DIAGNOSIS — S72001A Fracture of unspecified part of neck of right femur, initial encounter for closed fracture: Secondary | ICD-10-CM | POA: Diagnosis not present

## 2021-08-21 DIAGNOSIS — K219 Gastro-esophageal reflux disease without esophagitis: Secondary | ICD-10-CM | POA: Diagnosis not present

## 2021-08-21 DIAGNOSIS — S72144D Nondisplaced intertrochanteric fracture of right femur, subsequent encounter for closed fracture with routine healing: Secondary | ICD-10-CM | POA: Diagnosis not present

## 2021-08-21 DIAGNOSIS — E039 Hypothyroidism, unspecified: Secondary | ICD-10-CM | POA: Diagnosis not present

## 2021-08-21 LAB — CBC
HCT: 38.9 % (ref 36.0–46.0)
Hemoglobin: 12.4 g/dL (ref 12.0–15.0)
MCH: 29.8 pg (ref 26.0–34.0)
MCHC: 31.9 g/dL (ref 30.0–36.0)
MCV: 93.5 fL (ref 80.0–100.0)
Platelets: 271 10*3/uL (ref 150–400)
RBC: 4.16 MIL/uL (ref 3.87–5.11)
RDW: 14 % (ref 11.5–15.5)
WBC: 12.4 10*3/uL — ABNORMAL HIGH (ref 4.0–10.5)
nRBC: 0 % (ref 0.0–0.2)

## 2021-08-21 LAB — BASIC METABOLIC PANEL
Anion gap: 12 (ref 5–15)
BUN: 17 mg/dL (ref 8–23)
CO2: 26 mmol/L (ref 22–32)
Calcium: 9 mg/dL (ref 8.9–10.3)
Chloride: 99 mmol/L (ref 98–111)
Creatinine, Ser: 0.87 mg/dL (ref 0.44–1.00)
GFR, Estimated: >60 mL/min (ref >60)
Glucose, Bld: 169 mg/dL — ABNORMAL HIGH (ref 70–99)
Potassium: 3.9 mmol/L (ref 3.5–5.1)
Sodium: 137 mmol/L (ref 135–145)

## 2021-08-21 LAB — GLUCOSE, CAPILLARY
Glucose-Capillary: 121 mg/dL — ABNORMAL HIGH (ref 70–99)
Glucose-Capillary: 130 mg/dL — ABNORMAL HIGH (ref 70–99)
Glucose-Capillary: 117 mg/dL — ABNORMAL HIGH (ref 70–99)
Glucose-Capillary: 154 mg/dL — ABNORMAL HIGH (ref 70–99)

## 2021-08-21 MED ORDER — HYDROCODONE-ACETAMINOPHEN 7.5-325 MG PO TABS
1.0000 | ORAL_TABLET | Freq: Four times a day (QID) | ORAL | Status: DC | PRN
Start: 2021-08-21 — End: 2021-08-23
  Administered 2021-08-21 – 2021-08-23 (×4): 1 via ORAL
  Filled 2021-08-21 (×2): qty 2
  Filled 2021-08-21 (×2): qty 1

## 2021-08-21 MED ORDER — RIVAROXABAN 10 MG PO TABS
10.0000 mg | ORAL_TABLET | Freq: Every day | ORAL | Status: DC
  Administered 2021-08-21 – 2021-08-22 (×2): 10 mg via ORAL
  Filled 2021-08-21 (×2): qty 1

## 2021-08-21 NOTE — Progress Notes (Signed)
PROGRESS NOTE    Monica Murillo  ZJI:[id number] DOB: 21-Mar-1933 DOA: 08/18/2021 PCP: Yvonna Alanis, NP  Brief Narrative: 87/F with history of CAD, with history of PCI and stents, dual-chamber pacemaker for complete heart block, paroxysmal atrial fibrillation on Xarelto, interstitial lung disease/pulmonary fibrosis on 2 L O2 recently moved to [place] to live closer to her daughter presented to the ED from [facility] facility after mechanical fall, fell while attempting to sit backwards onto a wheelchair, landed on her right hip, was unable to bear weight thereafter. -In the ED blood pressure was elevated in the 200s, labs noted WBC of 12.4, chest x-ray noted pulmonary vascular congestion, hip x-ray noted right proximal femur fracture   Assessment & Plan:   Right proximal femur fracture Mechanical fall -Orthopedics consulted, underwent IM nailing yesterday by Dr. Mable Fill -Xarelto was on hold, will restart this today -Was started on Lovenox for DVT prophylaxis per orthopedics, will discontinue this in the setting of chronic Xarelto use -Pain control, PT OT -TOC consult, anticipate need for short-term rehab  Paroxysmal atrial fibrillation Complete heart block > Status post pacemaker -Restarting Xarelto -Continue metoprolol  Uncontrolled hypertension -Blood pressure soft now, discontinue hydralazine, continue metoprolol and amlodipine   CAD > History of MI listed in chart - Continue home metoprolol  Diabetes -Diet controlled, sliding scale insulin   Pulmonary fibrosis/ILD Chronic respiratory failure > Chest x-ray changes secondary to ILD - Continue home 2 L of oxygen   Hypothyroidism - Continue home Synthroid  GERD - Continue PPI   DVT prophylaxis:      SCDs  Code Status:                         DNR Family Communication: Discussed with patient in detail, no family at bedside Disposition Plan: Anticipate need for rehab Status is: inpatient because:  Severity of illness  Consultants:  Orthopedics  Procedures:   PROCEDURE(S): Cephalomedullary nail fixation of right intertrochanteric fracture (17510) Operative use of fluoroscopy for above procedure(s) (25852)    SURGEON: [name], M.D.   Subjective: -Feels okay, no events overnight, oral intake is poor  Objective: Vitals:   08/20/21 2218 08/21/21 0453 08/21/21 0728 08/21/21 0840  BP: (!) 113/52  (!) 112/59 (!) 105/46  Pulse: 64  65   Resp: (!) 22  14   Temp: 98.3 F (36.8 C) 98.5 F (36.9 C) 98.1 F (36.7 C)   TempSrc: Oral Oral Oral   SpO2: 100%  98%     Intake/Output Summary (Last 24 hours) at 08/21/2021 1214 Last data filed at 08/20/2021 1544 Gross per 24 hour  Intake 1000 ml  Output 200 ml  Net 800 ml   There were no vitals filed for this visit.  Examination:  General exam: Chronically ill elderly female sitting up in bed, AAOx3, no distress CVS: S1-S2, regular rate rhythm Lungs: Fine bilateral rales Abdomen: Soft, nontender, bowel sounds present Extremities: No edema, intact distal pulses Skin: No rashes Psychiatry: Mood & affect appropriate.     Data Reviewed:   CBC: Recent Labs  Lab 08/18/21 2114 08/20/21 0240 08/21/21 0238  WBC 12.4* 12.5* 12.4*  NEUTROABS 9.3*  --   --   HGB 13.3 15.3* 12.4  HCT 43.0 47.9* 38.9  MCV 94.5 91.8 93.5  PLT 208 251 778   Basic Metabolic Panel: Recent Labs  Lab 08/18/21 2114 08/20/21 0240 08/21/21 0238  NA 136 137 137  K 4.3 3.9 3.9  CL 104 102 99  CO2 20* 24 26  GLUCOSE 196* 123* 169*  BUN 17 11 17   CREATININE 0.76 0.73 0.87  CALCIUM 8.8* 9.6 9.0   GFR: Estimated Creatinine Clearance: 33.3 mL/min (by C-G formula based on SCr of 0.87 mg/dL). Liver Function Tests: Recent Labs  Lab 08/18/21 2114  AST 28  ALT 21  ALKPHOS 82  BILITOT 1.0  PROT 6.1*  ALBUMIN 3.9   No results for input(s): LIPASE, AMYLASE in the last 168 hours. No results for input(s): AMMONIA in the last 168  hours. Coagulation Profile: Recent Labs  Lab 08/18/21 2154  INR 1.1   Cardiac Enzymes: No results for input(s): CKTOTAL, CKMB, CKMBINDEX, TROPONINI in the last 168 hours. BNP (last 3 results) No results for input(s): PROBNP in the last 8760 hours. HbA1C: No results for input(s): HGBA1C in the last 72 hours. CBG: Recent Labs  Lab 08/20/21 0634 08/20/21 1550 08/20/21 2220 08/21/21 0902 08/21/21 1119  GLUCAP 128* 154* 154* 121* 130*   Lipid Profile: No results for input(s): CHOL, HDL, LDLCALC, TRIG, CHOLHDL, LDLDIRECT in the last 72 hours. Thyroid Function Tests: No results for input(s): TSH, T4TOTAL, FREET4, T3FREE, THYROIDAB in the last 72 hours. Anemia Panel: No results for input(s): VITAMINB12, FOLATE, FERRITIN, TIBC, IRON, RETICCTPCT in the last 72 hours. Urine analysis: No results found for: COLORURINE, APPEARANCEUR, LABSPEC, PHURINE, GLUCOSEU, HGBUR, BILIRUBINUR, KETONESUR, PROTEINUR, UROBILINOGEN, NITRITE, LEUKOCYTESUR Sepsis Labs: @LABRCNTIP (procalcitonin:4,lacticidven:4)  ) Recent Results (from the past 240 hour(s))  Resp Panel by RT-PCR (Flu A&B, Covid) Nasopharyngeal Swab     Status: None   Collection Time: 08/18/21  9:56 PM   Specimen: Nasopharyngeal Swab; Nasopharyngeal(NP) swabs in vial transport medium  Result Value Ref Range Status   SARS Coronavirus 2 by RT PCR NEGATIVE NEGATIVE Final    Comment: (NOTE) SARS-CoV-2 target nucleic acids are NOT DETECTED.  The SARS-CoV-2 RNA is generally detectable in upper respiratory specimens during the acute phase of infection. The lowest concentration of SARS-CoV-2 viral copies this assay can detect is 138 copies/mL. A negative result does not preclude SARS-Cov-2 infection and should not be used as the sole basis for treatment or other patient management decisions. A negative result may occur with  improper specimen collection/handling, submission of specimen other than nasopharyngeal swab, presence of viral  mutation(s) within the areas targeted by this assay, and inadequate number of viral copies(<138 copies/mL). A negative result must be combined with clinical observations, patient history, and epidemiological information. The expected result is Negative.  Fact Sheet for Patients:  EntrepreneurPulse.com.au  Fact Sheet for Healthcare Providers:  IncredibleEmployment.be  This test is no t yet approved or cleared by the [place] FDA and  has been authorized for detection and/or diagnosis of SARS-CoV-2 by FDA under an Emergency Use Authorization (EUA). This EUA will remain  in effect (meaning this test can be used) for the duration of the COVID-19 declaration under Section 564(b)(1) of the Act, 21 U.S.C.section 360bbb-3(b)(1), unless the authorization is terminated  or revoked sooner.       Influenza A by PCR NEGATIVE NEGATIVE Final   Influenza B by PCR NEGATIVE NEGATIVE Final    Comment: (NOTE) The Xpert Xpress SARS-CoV-2/FLU/RSV plus assay is intended as an aid in the diagnosis of influenza from Nasopharyngeal swab specimens and should not be used as a sole basis for treatment. Nasal washings and aspirates are unacceptable for Xpert Xpress SARS-CoV-2/FLU/RSV testing.  Fact Sheet for Patients: EntrepreneurPulse.com.au  Fact Sheet for Healthcare Providers: IncredibleEmployment.be  This  test is not yet approved or cleared by the [place] and has been authorized for detection and/or diagnosis of SARS-CoV-2 by FDA under an Emergency Use Authorization (EUA). This EUA will remain in effect (meaning this test can be used) for the duration of the COVID-19 declaration under Section 564(b)(1) of the Act, 21 U.S.C. section 360bbb-3(b)(1), unless the authorization is terminated or revoked.  Performed at [facility], [place] [address], [place], [place] [address]   Surgical pcr screen     Status: None    Collection Time: 08/20/21  1:42 PM   Specimen: Nasal Mucosa; Nasal Swab  Result Value Ref Range Status   MRSA, PCR NEGATIVE NEGATIVE Final   Staphylococcus aureus NEGATIVE NEGATIVE Final    Comment: (NOTE) The Xpert SA Assay (FDA approved for NASAL specimens in patients 21 years of age and older), is one component of a comprehensive surveillance program. It is not intended to diagnose infection nor to guide or monitor treatment. Performed at [facility], [place] [address] [place], [place] [address]          Radiology Studies: DG C-Arm 1-60 Min-No Report  Result Date: 08/20/2021 Fluoroscopy was utilized by the requesting physician.  No radiographic interpretation.   DG C-Arm 1-60 Min-No Report  Result Date: 08/20/2021 Fluoroscopy was utilized by the requesting physician.  No radiographic interpretation.   DG HIP UNILAT WITH PELVIS 2-3 VIEWS RIGHT  Result Date: 08/20/2021 CLINICAL DATA:  Postoperative. EXAM: DG HIP (WITH OR WITHOUT PELVIS) 2-3V RIGHT COMPARISON:  Right hip x-ray 08/18/2021. FINDINGS: There is a new short right femoral intramedullary nail and hip screw in place fixating a proximal femoral fracture. Alignment is anatomic. There are severe degenerative changes of the right hip with joint space narrowing, sclerosis and osteophyte formation similar to the prior study. There also moderate degenerative changes of the pubic symphysis, unchanged. There is right lateral hip soft tissue swelling and air compatible with recent surgery. IMPRESSION: 1. Right hip ORIF.  Alignment is anatomic. Electronically Signed   By: Ronney Asters M.D.   On: 08/20/2021 16:24   DG FEMUR, MIN 2 VIEWS RIGHT  Result Date: 08/20/2021 CLINICAL DATA:  Surgery, elective EXAM: RIGHT FEMUR 2 VIEWS COMPARISON:  None. FINDINGS: Multiple intraoperative images demonstrate right femur intramedullary rod placement as well as a dynamic screw traversing intertrochanteric fracture. IMPRESSION: Right  femur fracture fixation. Electronically Signed   By: Macy Mis M.D.   On: 08/20/2021 15:26        Scheduled Meds:  acetaminophen  500 mg Oral Q6H   amLODipine  10 mg Oral Daily   docusate sodium  100 mg Oral BID   enoxaparin (LOVENOX) injection  30 mg Subcutaneous Q12H   feeding supplement  237 mL Oral BID BM   hydrALAZINE  50 mg Oral TID   insulin aspart  0-9 Units Subcutaneous TID WC   levothyroxine  125 mcg Oral Daily   metoprolol succinate  25 mg Oral Daily   multivitamin with minerals  1 tablet Oral Daily   pantoprazole  40 mg Oral Daily   Continuous Infusions:  promethazine (PHENERGAN) injection (IM or IVPB)     tranexamic acid       LOS: 2 days    Time spent: 78min  Domenic Polite, MD [facility]   08/21/2021, 12:14 PM

## 2021-08-21 NOTE — Progress Notes (Signed)
PATIENT ID: Monica Murillo  MRN: [id number]  DOB/AGE:  [birth year] / 87 y.o.  1 Day Post-Op Procedure(s) (LRB): INTRAMEDULLARY (IM) NAIL INTERTROCHANTRIC (Right)    PROGRESS NOTE Subjective:   Patient is alert, oriented, no Nausea, no Vomiting, yes passing gas, no Bowel Movement. Taking PO with small sips. Denies SOB, Chest or Calf Pain. Using Incentive Spirometer, PAS in place. Ambulate WBAT with pt yet to get out of bed, Patient reports pain as moderate,    Objective: Vital signs in last 24 hours: Temp:  [97.5 F (36.4 C)-98.5 F (36.9 C)] 98.1 F (36.7 C) (12/31 0728) Pulse Rate:  [64-85] 65 (12/31 0728) Resp:  [13-22] 14 (12/31 0728) BP: (105-180)/(46-73) 105/46 (12/31 0840) SpO2:  [82 %-100 %] 98 % (12/31 0728)    Intake/Output from previous day: I/O last 3 completed shifts: In: 1000 [I.V.:1000] Out: 200 [Blood:200]   Intake/Output this shift: No intake/output data recorded.   LABORATORY DATA: Recent Labs    08/18/21 2154 08/19/21 0839 08/20/21 0240 08/20/21 0634 08/20/21 1550 08/20/21 2220 08/21/21 0238  WBC  --   --  12.5*  --   --   --  12.4*  HGB  --   --  15.3*  --   --   --  12.4  HCT  --   --  47.9*  --   --   --  38.9  PLT  --   --  251  --   --   --  271  NA  --   --  137  --   --   --  137  K  --   --  3.9  --   --   --  3.9  CL  --   --  102  --   --   --  99  CO2  --   --  24  --   --   --  26  BUN  --   --  11  --   --   --  17  CREATININE  --   --  0.73  --   --   --  0.87  GLUCOSE  --   --  123*  --   --   --  169*  GLUCAP  --    < >  --  128* 154* 154*  --   INR 1.1  --   --   --   --   --   --   CALCIUM  --   --  9.6  --   --   --  9.0   < > = values in this interval not displayed.    Examination: Neurologically intact Neurovascular intact Sensation intact distally Intact pulses distally Dorsiflexion/Plantar flexion intact Incision: dressing C/D/I No cellulitis present Compartment soft}  Assessment:   1 Day Post-Op Procedure(s)  (LRB): INTRAMEDULLARY (IM) NAIL INTERTROCHANTRIC (Right) ADDITIONAL DIAGNOSIS:  CAD, with history of PCI and stents, dual-chamber pacemaker for complete heart block, paroxysmal atrial fibrillation on Xarelto, interstitial lung disease/pulmonary fibrosis  Plan:   POST OPERATIVE INSTRUCTIONS: Mobility: Out of bed with PT/OT Pain control: Continue to wean/titrate to appropriate oral regimen DVT Prophylaxis: Lovenox 30 mg twice daily x6 weeks Further surgical plans: None RUE: No restrictions LUE: No restrictions RLE: Weightbearing as tolerated, range of motion as tolerated LLE: No restrictions Disposition: Per primary team Dressing care: Keep Mepilex on and dry for 5 days.  Do not allow surgical area to get  wet before that.  Remove Mepilex dressing after 5 days and allow area to get wet in shower but DO NOT SUBMERGE. Follow-up: Please call [facility] [phone]) to schedule follow-op appointment for 2 weeks after surgery. I have verified that my discharge instructions and follow-up information has been entered in the Discharge Navigator in Epic.  These should automatically populate in the AVS.  Please print the AVS in its entirety and ensure that the patient or a responsible party has a complete copy of the AVS before they are discharged.  If there are questions regarding discharge instructions or follow-up before the AVS is generated, please check the Discharge Navigator before attempting to contact the surgeon/office.  If unsure how to access the Discharge Navigator or the information contained in the Discharge Navigator, or how to generate/print the AVS, please contact the appropriate Nurse, learning disability.      Joanell Rising 08/21/2021, 8:55 AM

## 2021-08-21 NOTE — Evaluation (Signed)
Physical Therapy Evaluation Patient Details Name: Monica Murillo MRN: [id number] DOB: 1933/04/05 Today's Date: 08/21/2021  History of Present Illness  Monica Murillo is a 87 y.o. female with medical history significant of complete heart block, GERD, hypothyroidism, memory impairment, pulmonary fibrosis on chronic oxygen, diabetes, CAD, anemia who presents after a fall at her facility sustaining  R intertrochanteric femur fx, who underwent R IM NAIL on 12/30.   Clinical Impression  Pt admitted with above. Pt was mildly confused. Mobility limited by R hip pain today. Pt was ambulatory with RW and indep with ADLs prior to admit and was living in [place]. Pt now requiring assist for all ADLs and mobility. Pt to benefit from SNF upon d/c to achieve safe mod I level of fucntion. Acute PT to cont to follow.     Recommendations for follow up therapy are one component of a multi-disciplinary discharge planning process, led by the attending physician.  Recommendations may be updated based on patient status, additional functional criteria and insurance authorization.  Follow Up Recommendations Skilled nursing-short term rehab (<3 hours/day)    Assistance Recommended at Discharge Frequent or constant Supervision/Assistance  Functional Status Assessment Patient has had a recent decline in their functional status and/or demonstrates limited ability to make significant improvements in function in a reasonable and predictable amount of time  Equipment Recommendations   (TBD at next venue)    Recommendations for Other Services       Precautions / Restrictions Precautions Precautions: Fall Restrictions Weight Bearing Restrictions: Yes RLE Weight Bearing: Weight bearing as tolerated      Mobility  Bed Mobility Overal bed mobility: Needs Assistance Bed Mobility: Supine to Sit     Supine to sit: Max assist;+2 for physical assistance;HOB elevated     General bed mobility comments: utlized  helicopter technique for transfer to EOB    Transfers Overall transfer level: Needs assistance Equipment used: 2 person hand held assist Transfers: Sit to/from Stand;Bed to chair/wheelchair/BSC Sit to Stand: Max assist;+2 physical assistance   Step pivot transfers: Mod assist;+2 physical assistance       General transfer comment: attempted to use RW for sit to stand however pt unable to tolerate and strongly resisted. Pt  then provided face to face support with PT and tech via gait belt and pt holding onto therapists arms in which she pulled self up with modAx2 and took steps to chair going to the L    Ambulation/Gait               General Gait Details: unable at this time  Stairs            Wheelchair Mobility    Modified Rankin (Stroke Patients Only)       Balance Overall balance assessment: Needs assistance Sitting-balance support: Feet supported;Bilateral upper extremity supported Sitting balance-Leahy Scale: Fair Sitting balance - Comments: pt requiring min to contact guard due to report "oh I am so dizzy"   Standing balance support: Bilateral upper extremity supported Standing balance-Leahy Scale: Zero Standing balance comment: dependent on external assist                             Pertinent Vitals/Pain Pain Assessment: 0-10 Pain Score: 8  Pain Location: R hip Pain Descriptors / Indicators: Discomfort;Grimacing Pain Intervention(s): Premedicated before session    Home Living Family/patient expects to be discharged to:: Skilled nursing facility  Additional Comments: pt was at indep living at [facility]    Prior Function Prior Level of Function : Needs assist             Mobility Comments: pt amb with RW ADLs Comments: pt reports dressing and bathing on own, pt did go to dinning hall but was able to feed self. Pt takes care of her 2 cats     Hand Dominance   Dominant Hand: Right     Extremity/Trunk Assessment   Upper Extremity Assessment Upper Extremity Assessment: Overall WFL for tasks assessed    Lower Extremity Assessment Lower Extremity Assessment: RLE deficits/detail RLE Deficits / Details: pt very guarded with minimal tolerance of R LE ROM and WBing due to pain    Cervical / Trunk Assessment Cervical / Trunk Assessment: Kyphotic  Communication   Communication: No difficulties  Cognition Arousal/Alertness: Awake/alert Behavior During Therapy: WFL for tasks assessed/performed Overall Cognitive Status: No family/caregiver present to determine baseline cognitive functioning                                 General Comments: pt tangential with noted short term memory deficits. Pt not oriented to date but aware she fell and was able to give details on how she fell. Pt kept perseverating on "the 8th" despite freq re-orientation to 12/31. Pt reports moving from [place] on 12/3 and only living her for a few weeks        General Comments General comments (skin integrity, edema, etc.): VSS    Exercises     Assessment/Plan    PT Assessment Patient needs continued PT services  PT Problem List Decreased strength;Decreased range of motion;Decreased activity tolerance;Decreased balance;Decreased mobility;Decreased coordination;Decreased knowledge of use of DME;Decreased safety awareness       PT Treatment Interventions DME instruction;Gait training;Functional mobility training;Therapeutic activities;Therapeutic exercise;Stair training;Balance training;Neuromuscular re-education    PT Goals (Current goals can be found in the Care Plan section)  Acute Rehab PT Goals PT Goal Formulation: With patient Time For Goal Achievement: 09/04/21 Potential to Achieve Goals: Good    Frequency Min 3X/week   Barriers to discharge        Co-evaluation               AM-PAC PT "6 Clicks" Mobility  Outcome Measure Help needed turning from your back  to your side while in a flat bed without using bedrails?: A Lot Help needed moving from lying on your back to sitting on the side of a flat bed without using bedrails?: A Lot Help needed moving to and from a bed to a chair (including a wheelchair)?: A Lot Help needed standing up from a chair using your arms (e.g., wheelchair or bedside chair)?: A Lot Help needed to walk in hospital room?: Total Help needed climbing 3-5 steps with a railing? : Total 6 Click Score: 10    End of Session Equipment Utilized During Treatment: Gait belt Activity Tolerance: Patient limited by pain Patient left: in chair;with call bell/phone within reach;with chair alarm set Nurse Communication: Mobility status PT Visit Diagnosis: Unsteadiness on feet (R26.81);Muscle weakness (generalized) (M62.81);Difficulty in walking, not elsewhere classified (R26.2)    Time: 0930-1003 PT Time Calculation (min) (ACUTE ONLY): 33 min   Charges:   PT Evaluation $PT Eval Moderate Complexity: 1 Mod PT Treatments $Therapeutic Activity: 8-22 mins        Kittie Plater, PT, DPT Acute Rehabilitation Services Pager #:  [phone] Office #: [phone]   Berline Lopes 08/21/2021, 3:48 PM

## 2021-08-22 ENCOUNTER — Encounter (HOSPITAL_COMMUNITY): Payer: Self-pay | Admitting: Orthopedic Surgery

## 2021-08-22 DIAGNOSIS — S72144D Nondisplaced intertrochanteric fracture of right femur, subsequent encounter for closed fracture with routine healing: Secondary | ICD-10-CM | POA: Diagnosis not present

## 2021-08-22 DIAGNOSIS — Y92009 Unspecified place in unspecified non-institutional (private) residence as the place of occurrence of the external cause: Secondary | ICD-10-CM

## 2021-08-22 DIAGNOSIS — I480 Paroxysmal atrial fibrillation: Secondary | ICD-10-CM | POA: Diagnosis not present

## 2021-08-22 DIAGNOSIS — I10 Essential (primary) hypertension: Secondary | ICD-10-CM | POA: Diagnosis not present

## 2021-08-22 DIAGNOSIS — W19XXXD Unspecified fall, subsequent encounter: Secondary | ICD-10-CM

## 2021-08-22 DIAGNOSIS — W19XXXA Unspecified fall, initial encounter: Secondary | ICD-10-CM

## 2021-08-22 LAB — BASIC METABOLIC PANEL
Anion gap: 10 (ref 5–15)
BUN: 21 mg/dL (ref 8–23)
CO2: 28 mmol/L (ref 22–32)
Calcium: 9.1 mg/dL (ref 8.9–10.3)
Chloride: 101 mmol/L (ref 98–111)
Creatinine, Ser: 0.78 mg/dL (ref 0.44–1.00)
GFR, Estimated: >60 mL/min (ref >60)
Glucose, Bld: 127 mg/dL — ABNORMAL HIGH (ref 70–99)
Potassium: 3.7 mmol/L (ref 3.5–5.1)
Sodium: 139 mmol/L (ref 135–145)

## 2021-08-22 LAB — CBC
HCT: 35.9 % — ABNORMAL LOW (ref 36.0–46.0)
Hemoglobin: 11.6 g/dL — ABNORMAL LOW (ref 12.0–15.0)
MCH: 29.7 pg (ref 26.0–34.0)
MCHC: 32.3 g/dL (ref 30.0–36.0)
MCV: 91.8 fL (ref 80.0–100.0)
Platelets: 251 10*3/uL (ref 150–400)
RBC: 3.91 MIL/uL (ref 3.87–5.11)
RDW: 14.1 % (ref 11.5–15.5)
WBC: 10.2 10*3/uL (ref 4.0–10.5)
nRBC: 0 % (ref 0.0–0.2)

## 2021-08-22 LAB — GLUCOSE, CAPILLARY
Glucose-Capillary: 126 mg/dL — ABNORMAL HIGH (ref 70–99)
Glucose-Capillary: 169 mg/dL — ABNORMAL HIGH (ref 70–99)
Glucose-Capillary: 135 mg/dL — ABNORMAL HIGH (ref 70–99)
Glucose-Capillary: 140 mg/dL — ABNORMAL HIGH (ref 70–99)

## 2021-08-22 MED ORDER — RIVAROXABAN 15 MG PO TABS
15.0000 mg | ORAL_TABLET | Freq: Every day | ORAL | Status: DC
  Filled 2021-08-22: qty 1

## 2021-08-22 NOTE — Assessment & Plan Note (Signed)
Sliding scale Novolog. Diet controlled.

## 2021-08-22 NOTE — Assessment & Plan Note (Signed)
Orthopedics consulted, underwent IM nailing 12/30 with Dr. Purcell Mouton held on admission, resumed 12/31 Pain control PT/OT - SNF recommended for rehab [facility] consulted for placement.

## 2021-08-22 NOTE — Assessment & Plan Note (Signed)
Resumed on Xarelto 12/31, post-operatively. Rate controlled. Continue metoprolol. Telemetry.

## 2021-08-22 NOTE — Assessment & Plan Note (Signed)
Continue PPI ?

## 2021-08-22 NOTE — TOC Initial Note (Signed)
Transition of Care [facility]) - Initial/Assessment Note    Patient Details  Name: Monica Murillo MRN: [id number] Date of Birth: October 31, 1932  [facility]) CM/SW Contact:    Joanne Chars, LCSW Phone Number: 08/22/2021, 2:56 PM  Clinical Narrative:     CSW met with pt and daughter Vaughan Basta.  Permission given to speak with Vaughan Basta present and also with other daughter Alyse Low, who lives in [place] but is out of town currently.  Pt is resident at [facility], in independent living.  Moved to [place] 3 weeks ago.  Pt is agreeable to SNF, choice document given, permission given to send out referral in hub.   Pt is vaccinated for covid with one booster.    Referral sent out in hub.               Expected Discharge Plan: Skilled Nursing Facility Barriers to Discharge: Continued Medical Work up, SNF Pending bed offer   Patient Goals and CMS Choice Patient states their goals for this hospitalization and ongoing recovery are:: walking CMS Medicare.gov Compare Post Acute Care list provided to:: Patient Represenative (must comment) Choice offered to / list presented to : Adult Children  Expected Discharge Plan and Services Expected Discharge Plan: [place] In-house Referral: Clinical Social Work   Post Acute Care Choice: [place] Living arrangements for the past 2 months: [place] ([place])                                      Prior Living Arrangements/Services Living arrangements for the past 2 months: [place] ([place]) Lives with:: Facility Resident Patient language and need for interpreter reviewed:: Yes Do you feel safe going back to the place where you live?: Yes      Need for Family Participation in Patient Care: Yes (Comment) Care giver support system in place?: Yes (comment) Current home services: Other (comment) (na) Criminal Activity/Legal  Involvement Pertinent to Current Situation/Hospitalization: No - Comment as needed  Activities of Daily Living      Permission Sought/Granted Permission sought to share information with : Family Supports Permission granted to share information with : Yes, Verbal Permission Granted  Share Information with NAME: daughters Alyse Low and Vaughan Basta  Permission granted to share info w AGENCY: SNF        Emotional Assessment Appearance:: Appears stated age Attitude/Demeanor/Rapport: Engaged Affect (typically observed): Appropriate, Pleasant Orientation: : Oriented to Self, Oriented to Place, Oriented to  Time, Oriented to Situation Alcohol / Substance Use: Not Applicable Psych Involvement: No (comment)  Admission diagnosis:  Primary hypertension [I10] Femur fracture, right ([place]) [S72.91XA] Closed fracture of right hip, initial encounter ([place]) [S72.001A] Patient Active Problem List   Diagnosis Date Noted   Fall at home 08/22/2021   Essential hypertension 08/22/2021   PAF (paroxysmal atrial fibrillation) ([place]) 08/22/2021   Femur fracture, right ([place]) 08/18/2021   Complete heart block ([place]) 07/30/2021   Pulmonary fibrosis ([place]) 07/30/2021   Type 2 diabetes mellitus with hyperglycemia, without long-term current use of insulin ([place]) 07/30/2021   Hypothyroidism 07/30/2021   Gastroesophageal reflux disease without esophagitis 07/30/2021   Macular degeneration of both eyes 07/30/2021   Chronic right hip pain 07/30/2021   Memory impairment 07/30/2021   Past heart attack 07/30/2021   PCP:  Yvonna Alanis, NP Pharmacy:   [id number]- [place], [place] - [address] AT [place] [address] [place] [place] [address] Phone: [phone] Fax: [place], [address] AT [address] [place] [address] Phone: [phone] Fax: [phone]     Social  Determinants of Health (SDOH) Interventions    Readmission Risk Interventions No flowsheet data found.

## 2021-08-22 NOTE — Assessment & Plan Note (Signed)
Status post pacemaker 

## 2021-08-22 NOTE — Progress Notes (Signed)
Pt was on Xarelto 10mg  qday for her AF prior to admission. She should be on 15mg  instead. D/w Dr Arbutus Ped and we will change it here. Needs to continue at the correct dose at discharge.   Onnie Boer, PharmD, BCIDP, AAHIVP, CPP Infectious Disease Pharmacist 08/22/2021 1:11 PM

## 2021-08-22 NOTE — Assessment & Plan Note (Signed)
Continue Synthroid °

## 2021-08-22 NOTE — Progress Notes (Signed)
°  Progress Note   Patient: Monica Murillo BDZ:[id number] DOB: 1933-03-18 DOA: 08/18/2021     3 DOS: the patient was seen and examined on 08/22/2021   Brief hospital course: [name] with history of CAD, with history of PCI and stents, dual-chamber pacemaker for complete heart block, paroxysmal atrial fibrillation on Xarelto, interstitial lung disease/pulmonary fibrosis on 2 L O2 recently moved to [place] to live closer to her daughter presented to the ED from [facility] independent living facility after mechanical fall.  She reportedly fell while attempting to sit backwards onto a wheelchair, landed on her right hip, was subsequently unable to bear weight. In the ED, blood pressure was elevated in the 200s, labs noted WBC of 12.4, chest x-ray with pulmonary vascular congestion, hip x-ray noted right proximal femur fracture.  Patient admitted to [facility] service with orthopedic surgery consulted.  She underwent IM nail fixation of the fracture on 08/20/21 with Dr. Mable Fill of [place].  Assessment and Plan * Femur fracture, right ([place])- (present on admission) Orthopedics consulted, underwent IM nailing 12/30 with Dr. Purcell Mouton held on admission, resumed 12/31 Pain control PT/OT - SNF recommended for rehab [facility] consulted for placement.  Fall at home Mechanical fall when seating in wheelchair. Resulted in femur fracture. --Fall precautions  PAF (paroxysmal atrial fibrillation) ([place])- (present on admission) Resumed on Xarelto 12/31, post-operatively. Rate controlled. Continue metoprolol. Telemetry.  Essential hypertension- (present on admission) Blood pressure initially was soft, now mildly elevated.  Continue metoprolol and amlodipine.  PRN labetalol.  Past heart attack CAD stable. Continue metoprolol.  Gastroesophageal reflux disease without esophagitis- (present on admission) Continue PPI  Hypothyroidism- (present on admission) Continue Synthroid  Type 2 diabetes  mellitus with hyperglycemia, without long-term current use of insulin ([place])- (present on admission) Sliding scale Novolog. Diet controlled.  Pulmonary fibrosis ([place])- (present on admission) Chronic respiratory failure with hypoxia on 2 L/min O2 - continue. Chest x-ray changes secondary to ILD. Stable clinically. Monitor.  Complete heart block ([place])- (present on admission) Status post pacemaker.     Subjective: Pt seen after working with PT this AM.  Reports pain as moderated, controlled fairly well with medication.  No other acute complaints.    Objective Vtals reviewed and notable for mildly elevated BP's (341'D to 622 systolic), were soft yesterday AM 105/46   Data Reviewed: Labs and data reviewed  and notable for: Hbg 12.4>>11.6 Glucose 127   Family Communication:   Disposition: Status is: Inpatient  Remains inpatient appropriate because: requires SNF for rehab s/p repair of femur fracture         Time spent: 25 minutes  Author: Ezekiel Slocumb 08/22/2021 2:54 PM  For on call review [email or web address].

## 2021-08-22 NOTE — Assessment & Plan Note (Signed)
CAD stable. Continue metoprolol.

## 2021-08-22 NOTE — Progress Notes (Signed)
PATIENT ID: Monica Murillo  MRN: [id number]  DOB/AGE:  Nov 04, 1932 / 88 y.o.  2 Days Post-Op Procedure(s) (LRB): INTRAMEDULLARY (IM) NAIL INTERTROCHANTRIC (Right)    PROGRESS NOTE Subjective:   Patient is alert, oriented, yes Nausea, no Vomiting, yes passing gas, no Bowel Movement. Taking PO well with pt eating breakfast. Denies SOB, Chest or Calf Pain. Using Incentive Spirometer, PAS in place. Ambulate WBAT with pt up with therapy yesterday, Patient reports pain as moderate,     Objective: Vital signs in last 24 hours: Temp:  [97.7 F (36.5 C)-97.9 F (36.6 C)] 97.7 F (36.5 C) (01/01 0727) Pulse Rate:  [81-82] 81 (01/01 0727) Resp:  [16-18] 18 (01/01 0727) BP: (131-152)/(59-71) 152/59 (01/01 0727) SpO2:  [94 %-100 %] 94 % (01/01 0727)    Intake/Output from previous day: No intake/output data recorded.   Intake/Output this shift: Total I/O In: 120 [P.O.:120] Out: -    LABORATORY DATA: Recent Labs    08/21/21 0238 08/21/21 0902 08/21/21 1604 08/21/21 1956 08/22/21 0215 08/22/21 0740  WBC 12.4*  --   --   --  10.2  --   HGB 12.4  --   --   --  11.6*  --   HCT 38.9  --   --   --  35.9*  --   PLT 271  --   --   --  251  --   NA 137  --   --   --  139  --   K 3.9  --   --   --  3.7  --   CL 99  --   --   --  101  --   CO2 26  --   --   --  28  --   BUN 17  --   --   --  21  --   CREATININE 0.87  --   --   --  0.78  --   GLUCOSE 169*  --   --   --  127*  --   GLUCAP  --    < > 117* 154*  --  126*  CALCIUM 9.0  --   --   --  9.1  --    < > = values in this interval not displayed.    Examination: Neurologically intact Neurovascular intact Sensation intact distally Intact pulses distally Dorsiflexion/Plantar flexion intact Incision: dressing C/D/I and no drainage No cellulitis present Compartment soft}  Assessment:   2 Days Post-Op Procedure(s) (LRB): INTRAMEDULLARY (IM) NAIL INTERTROCHANTRIC (Right) ADDITIONAL DIAGNOSIS: CAD, with history of PCI and stents,  dual-chamber pacemaker for complete heart block, paroxysmal atrial fibrillation on Xarelto, interstitial lung disease/pulmonary fibrosis  Plan:    POST OPERATIVE INSTRUCTIONS: Mobility: Out of bed with PT/OT Pain control: Continue to wean/titrate to appropriate oral regimen DVT Prophylaxis: Pt has restarted normal Xarelto dose Further surgical plans: None RUE: No restrictions LUE: No restrictions RLE: Weightbearing as tolerated, range of motion as tolerated LLE: No restrictions Disposition: Per primary team Dressing care: Keep Mepilex on and dry for 5 days.  Do not allow surgical area to get wet before that.  Remove Mepilex dressing after 5 days and allow area to get wet in shower but DO NOT SUBMERGE. Follow-up: Please call [facility] [phone]) to schedule follow-op appointment for 2 weeks after surgery. I have verified that my discharge instructions and follow-up information has been entered in the Discharge Navigator in Epic.  These should  automatically populate in the AVS.  Please print the AVS in its entirety and ensure that the patient or a responsible party has a complete copy of the AVS before they are discharged.  If there are questions regarding discharge instructions or follow-up before the AVS is generated, please check the Discharge Navigator before attempting to contact the surgeon/office.  If unsure how to access the Discharge Navigator or the information contained in the Discharge Navigator, or how to generate/print the AVS, please contact the appropriate Nurse, learning disability.   DISCHARGE PLAN: Skilled Nursing Facility/Rehab is recommended by PT when medically safe and bed available  DISCHARGE NEEDS: HHPT, Walker, and 3-in-1 comode seat     Joanell Rising 08/22/2021, 9:48 AM

## 2021-08-22 NOTE — Hospital Course (Signed)
[name] with history of CAD, with history of PCI and stents, dual-chamber pacemaker for complete heart block, paroxysmal atrial fibrillation on Xarelto, interstitial lung disease/pulmonary fibrosis on 2 L O2 recently moved to [place] to live closer to her daughter presented to the ED from [facility] independent living facility after mechanical fall.  She reportedly fell while attempting to sit backwards onto a wheelchair, landed on her right hip, was subsequently unable to bear weight. In the ED, blood pressure was elevated in the 200s, labs noted WBC of 12.4, chest x-ray with pulmonary vascular congestion, hip x-ray noted right proximal femur fracture.  Patient admitted to [facility] service with orthopedic surgery consulted.  She underwent IM nail fixation of the fracture on 08/20/21 with Dr. Mable Fill of [place].

## 2021-08-22 NOTE — Assessment & Plan Note (Signed)
Blood pressure initially was soft, now mildly elevated.  Continue metoprolol and amlodipine.  PRN labetalol.

## 2021-08-22 NOTE — Assessment & Plan Note (Addendum)
Chronic respiratory failure with hypoxia on 2 L/min O2 - continue. Chest x-ray changes secondary to ILD. Stable clinically. Monitor.

## 2021-08-22 NOTE — Assessment & Plan Note (Signed)
Mechanical fall when seating in wheelchair. Resulted in femur fracture. --Fall precautions

## 2021-08-22 NOTE — NC FL2 (Signed)
[place] LEVEL OF CARE SCREENING TOOL     IDENTIFICATION  Patient Name: Monica Murillo Birthdate: 1933-05-11 Sex: female Admission Date (Current Location): 08/18/2021  [facility] and [place] Number:  Herbalist and Address:  [facility] [place], [place] [address], [place], [place] [address]      Provider Number: [id number]  Attending Physician Name and Address:  Ezekiel Slocumb, DO  Relative Name and Phone Number:  SCOTT,CHRISTIE Daughter [phone]    Current Level of Care: Hospital Recommended Level of Care: [place] Prior Approval Number:    Date Approved/Denied:   PASRR Number: [id number]  Discharge Plan: SNF    Current Diagnoses: Patient Active Problem List   Diagnosis Date Noted   Fall at home 08/22/2021   Essential hypertension 08/22/2021   Femur fracture, right ([place]) 08/18/2021   Complete heart block ([place]) 07/30/2021   Pulmonary fibrosis ([place]) 07/30/2021   Type 2 diabetes mellitus with hyperglycemia, without long-term current use of insulin ([place]) 07/30/2021   Hypothyroidism 07/30/2021   Gastroesophageal reflux disease without esophagitis 07/30/2021   Macular degeneration of both eyes 07/30/2021   Chronic right hip pain 07/30/2021   Memory impairment 07/30/2021   Past heart attack 07/30/2021    Orientation RESPIRATION BLADDER Height & Weight     Self, Time, Situation, Place  O2 Continent Weight:   Height:     BEHAVIORAL SYMPTOMS/MOOD NEUROLOGICAL BOWEL NUTRITION STATUS      Continent Diet (see discharge summary)  AMBULATORY STATUS COMMUNICATION OF NEEDS Skin   Total Care Verbally Surgical wounds                       Personal Care Assistance Level of Assistance  Bathing, Feeding, Dressing Bathing Assistance: Maximum assistance Feeding assistance: Limited assistance Dressing Assistance: Maximum assistance     Functional Limitations Info  Sight, Hearing, Speech Sight Info:  Adequate Hearing Info: Adequate Speech Info: Adequate    SPECIAL CARE FACTORS FREQUENCY  PT (By licensed PT), OT (By licensed OT)     PT Frequency: 5x week OT Frequency: 5x week            Contractures Contractures Info: Not present    Additional Factors Info  Code Status, Allergies, Insulin Sliding Scale Code Status Info: DNR Allergies Info: Lidocaine-epinephrine, Sulfa Antibiotics   Insulin Sliding Scale Info: Novolog, 0-9 units 3x day with meals.  See discharge summary.       Current Medications (08/22/2021):  This is the current hospital active medication list Current Facility-Administered Medications  Medication Dose Route Frequency Provider Last Rate Last Admin   acetaminophen (TYLENOL) tablet 325-650 mg  325-650 mg Oral Q6H PRN [name], MD       amLODipine (NORVASC) tablet 10 mg  10 mg Oral Daily Domenic Polite, MD   10 mg at 08/22/21 7902   docusate sodium (COLACE) capsule 100 mg  100 mg Oral BID [name], MD   100 mg at 08/22/21 4097   feeding supplement (ENSURE ENLIVE / ENSURE PLUS) liquid 237 mL  237 mL Oral BID BM Domenic Polite, MD   237 mL at 08/21/21 1219   HYDROcodone-acetaminophen (NORCO) 7.5-325 MG per tablet 1-2 tablet  1-2 tablet Oral Q6H PRN Domenic Polite, MD   1 tablet at 08/22/21 1135   HYDROmorphone (DILAUDID) injection 0.5 mg  0.5 mg Intravenous Q3H PRN Marcelyn Bruins, MD   0.5 mg at 08/21/21 1407   insulin aspart (novoLOG)  injection 0-9 Units  0-9 Units Subcutaneous TID WC Marcelyn Bruins, MD   1 Units at 08/22/21 0755   labetalol (NORMODYNE) injection 10 mg  10 mg Intravenous Q2H PRN Domenic Polite, MD       levothyroxine (SYNTHROID) tablet 125 mcg  125 mcg Oral Daily Marcelyn Bruins, MD   125 mcg at 08/22/21 9476   menthol-cetylpyridinium (CEPACOL) lozenge 3 mg  1 lozenge Oral PRN [name], MD       Or   phenol (CHLORASEPTIC) mouth spray 1 spray  1 spray Mouth/Throat PRN [name], MD       metoprolol  succinate (TOPROL-XL) 24 hr tablet 25 mg  25 mg Oral Daily Marcelyn Bruins, MD   25 mg at 08/22/21 5465   morphine 2 MG/ML injection 0.5-1 mg  0.5-1 mg Intravenous Q2H PRN [name], MD   1 mg at 08/22/21 0354   multivitamin with minerals tablet 1 tablet  1 tablet Oral Daily Domenic Polite, MD   1 tablet at 08/22/21 0923   ondansetron (ZOFRAN) tablet 4 mg  4 mg Oral Q6H PRN [name], MD       Or   ondansetron [facility]) injection 4 mg  4 mg Intravenous Q6H PRN [name], MD       pantoprazole (PROTONIX) EC tablet 40 mg  40 mg Oral Daily Marcelyn Bruins, MD   40 mg at 08/22/21 6568   polyethylene glycol (MIRALAX / GLYCOLAX) packet 17 g  17 g Oral Daily PRN Marcelyn Bruins, MD       promethazine (PHENERGAN) 12.5 mg in sodium chloride 0.9 % 50 mL IVPB  12.5 mg Intravenous Once Marcelyn Bruins, MD       [START ON 08/23/2021] Rivaroxaban (XARELTO) tablet 15 mg  15 mg Oral Q supper Pham, Minh Q, RPH-CPP         Discharge Medications: Please see discharge summary for a list of discharge medications.  Relevant Imaging Results:  Relevant Lab Results:   Additional Information SSN: [id number].  Pt is vaccinated for covid with one booster.  Joanne Chars, LCSW

## 2021-08-23 DIAGNOSIS — S72144D Nondisplaced intertrochanteric fracture of right femur, subsequent encounter for closed fracture with routine healing: Secondary | ICD-10-CM | POA: Diagnosis not present

## 2021-08-23 LAB — RESP PANEL BY RT-PCR (FLU A&B, COVID) ARPGX2
Influenza A by PCR: NEGATIVE
Influenza B by PCR: NEGATIVE
SARS Coronavirus 2 by RT PCR: NEGATIVE

## 2021-08-23 LAB — GLUCOSE, CAPILLARY
Glucose-Capillary: 119 mg/dL — ABNORMAL HIGH (ref 70–99)
Glucose-Capillary: 121 mg/dL — ABNORMAL HIGH (ref 70–99)

## 2021-08-23 MED ORDER — POLYETHYLENE GLYCOL 3350 17 G PO PACK: 17.0000 g | PACK | Freq: Every day | ORAL | 0 refills | Status: DC | PRN

## 2021-08-23 MED ORDER — HYDROCODONE-ACETAMINOPHEN 5-325 MG PO TABS: 1.0000 | ORAL_TABLET | ORAL | 0 refills | Status: AC | PRN

## 2021-08-23 MED ORDER — DOCUSATE SODIUM 100 MG PO CAPS: 100.0000 mg | ORAL_CAPSULE | Freq: Two times a day (BID) | ORAL | 0 refills | Status: DC

## 2021-08-23 MED ORDER — ENSURE ENLIVE PO LIQD: 237.0000 mL | Freq: Two times a day (BID) | ORAL | 12 refills | Status: DC

## 2021-08-23 MED ORDER — ADULT MULTIVITAMIN W/MINERALS CH: 1.0000 | ORAL_TABLET | Freq: Every day | ORAL | Status: DC

## 2021-08-23 MED ORDER — RIVAROXABAN 15 MG PO TABS: 15.0000 mg | ORAL_TABLET | Freq: Every day | ORAL | Status: DC

## 2021-08-23 MED ORDER — AMLODIPINE BESYLATE 10 MG PO TABS: 10.0000 mg | ORAL_TABLET | Freq: Every day | ORAL | Status: DC

## 2021-08-23 NOTE — TOC Progression Note (Addendum)
Transition of Care [facility]) - Progression Note    Patient Details  Name: Monica Murillo MRN: [id number] Date of Birth: 08/29/1932  [facility] CM/SW Contact  Joanne Chars, LCSW Phone Number: 08/23/2021, 10:10 AM  Clinical Narrative:   CSW presented bed offers to pt and daughter.  They asked that CSW reach out to [facility] to see if they can make offers, which was done.   [facility] does not have beds available.       Expected Discharge Plan: [place] Barriers to Discharge: Continued Medical Work up, SNF Pending bed offer  Expected Discharge Plan and Services Expected Discharge Plan: [place] In-house Referral: Clinical Social Work   Post Acute Care Choice: [place] Living arrangements for the past 2 months: [place] ([place])                                       Social Determinants of Health (SDOH) Interventions    Readmission Risk Interventions No flowsheet data found.

## 2021-08-23 NOTE — TOC Transition Note (Signed)
Transition of Care [facility]) - CM/SW Discharge Note   Patient Details  Name: Monica Murillo MRN: [id number] Date of Birth: 12/13/1932  [facility]) CM/SW Contact:  Joanne Chars, LCSW Phone Number: 08/23/2021, 1:14 PM   Clinical Narrative:   Pt discharging to [facility].  RN call report to [phone].    Final next level of care: Skilled Nursing Facility Barriers to Discharge: Barriers Resolved   Patient Goals and CMS Choice Patient states their goals for this hospitalization and ongoing recovery are:: walking CMS Medicare.gov Compare Post Acute Care list provided to:: Patient Represenative (must comment) Choice offered to / list presented to : Adult Children  Discharge Placement              Patient [facility] bed at: [facility] Patient to be transferred to facility by: [place] Name of family member notified: daughter Vaughan Basta in room Patient and family notified of of transfer: 08/23/21  Discharge Plan and Services In-house Referral: Clinical Social Work   Post Acute Care Choice: [place]                               Social Determinants of Health (SDOH) Interventions     Readmission Risk Interventions No flowsheet data found.

## 2021-08-23 NOTE — Discharge Summary (Addendum)
Physician Discharge Summary   Patient: Monica Murillo MRN: [id number] DOB: @DOB   Admit date:     08/18/2021  Discharge date: 08/23/21  Discharge Physician: Ezekiel Slocumb   PCP: Yvonna Alanis, NP   Recommendations at discharge: 1. Follow up with Orthopedic surgery in 2 weeks 2. Follow up with PCP in 1-2 weeks     Discharge Diagnoses Principal Problem:   Femur fracture, right ([place]) Active Problems:   Fall at home   Complete heart block ([place])   Pulmonary fibrosis ([place])   Type 2 diabetes mellitus with hyperglycemia, without long-term current use of insulin (H[facility])   Hypothyroidism   Gastroesophageal reflux disease without esophagitis   Past heart attack   Essential hypertension   PAF (paroxysmal atrial fibrillation) ([place])   Patient's fall at home, with underlying osteoporosis, resulted in a fragility fracture.   Post-op day 2: patient doing well, pain controlled.  Medically stable for discharge to rehab for ongoing PT.     Hospital Course   [name] with history of CAD, with history of PCI and stents, dual-chamber pacemaker for complete heart block, paroxysmal atrial fibrillation on Xarelto, interstitial lung disease/pulmonary fibrosis on 2 L O2 recently moved to [place] to live closer to her daughter presented to the ED from [place] independent living facility after mechanical fall.  She reportedly fell while attempting to sit backwards onto a wheelchair, landed on her right hip, was subsequently unable to bear weight. In the ED, blood pressure was elevated in the 200s, labs noted WBC of 12.4, chest x-ray with pulmonary vascular congestion, hip x-ray noted right proximal femur fracture.  Patient admitted to [facility] service with orthopedic surgery consulted.  She underwent IM nail fixation of the fracture on 08/20/21 with Dr. Mable Fill of [place].  * Femur fracture, right ([place])- (present on admission) Orthopedics consulted, underwent IM nailing 12/30 with Dr.  Purcell Mouton held on admission, resumed 12/31 Pain control PT/OT - SNF recommended for rehab [facility] consulted for placement.  Fall at home Mechanical fall when seating in wheelchair. Resulted in femur fracture. --Fall precautions  PAF (paroxysmal atrial fibrillation) ([place])- (present on admission) Resumed on Xarelto 12/31, post-operatively. Rate controlled. Continue metoprolol. Telemetry.  Essential hypertension- (present on admission) Blood pressure initially was soft, now mildly elevated.  Continue metoprolol and amlodipine.  PRN labetalol.  Past heart attack CAD stable. Continue metoprolol.  Gastroesophageal reflux disease without esophagitis- (present on admission) Continue PPI  Hypothyroidism- (present on admission) Continue Synthroid  Type 2 diabetes mellitus with hyperglycemia, without long-term current use of insulin ([place])- (present on admission) Sliding scale Novolog. Diet controlled.  Pulmonary fibrosis ([place])- (present on admission) Chronic respiratory failure with hypoxia on 2 L/min O2 - continue. Chest x-ray changes secondary to ILD. Stable clinically. Monitor.  Complete heart block ([place])- (present on admission) Status post pacemaker.        Consultants: orthopedic surgery  Procedures performed: IM nailing of R intertrochanteric fracture  Disposition: Skilled nursing facility Diet recommendation: Regular diet  DISCHARGE MEDICATION: Allergies as of 08/23/2021       Reactions   Lidocaine-epinephrine Anxiety   Dizzy, disoriented, "cannot receive at the dentist"   Sulfa Antibiotics Nausea And Vomiting, Rash   Sulfa and related         Medication List     TAKE these medications    amLODipine 10 MG tablet Commonly known as: NORVASC Take 1 tablet (10 mg total) by mouth daily. Start taking on: August 24, 2021   docusate  sodium 100 MG capsule Commonly known as: COLACE Take 1 capsule (100 mg total) by mouth 2 (two) times daily.   feeding  supplement Liqd Take 237 mLs by mouth 2 (two) times daily between meals.   HYDROcodone-acetaminophen 5-325 MG tablet Commonly known as: Norco Take 1 tablet by mouth every 4 (four) hours as needed for up to 5 days.   levothyroxine 125 MCG tablet Commonly known as: SYNTHROID Take 125 mcg by mouth daily.   Lutein-Zeaxanthin 20-1 MG Caps Take 1 capsule by mouth in the morning.   METOPROLOL SUCCINATE PO Take 25 mg by mouth daily.   multivitamin with minerals Tabs tablet Take 1 tablet by mouth daily. Start taking on: August 24, 2021   NEXIUM PO Take 1 capsule by mouth in the morning.   OXYGEN Inhale into the lungs. 2 Liters   polyethylene glycol 17 g packet Commonly known as: MIRALAX / GLYCOLAX Take 17 g by mouth daily as needed for mild constipation.   Rivaroxaban 15 MG Tabs tablet Commonly known as: XARELTO Take 1 tablet (15 mg total) by mouth daily with supper. What changed:  medication strength how much to take when to take this   TYLENOL 8 HOUR ARTHRITIS PAIN PO Take 600 mg by mouth in the morning and at bedtime.               Discharge Care Instructions  (From admission, onward)           Start     Ordered   08/23/21 0000  Discharge wound care:       Comments: See above   08/23/21 1244            Follow-up Information     [name], MD. Schedule an appointment as soon as possible for a visit in 2 week(s).   Specialty: Orthopedic Surgery Contact information: [address] [place] [id number] [phone]                 Discharge Exam: Vital signs reviewed and unremarkable except for mildly elevated BP.  General exam: awake, alert, no acute distress Respiratory system: CTAB, no wheezes, rales or rhonchi, normal respiratory effort, on 2 L/min O2 [place]. Cardiovascular system: normal S1/S2, RRR no pedal edema, 2+ DP pulses.   Central nervous system: A&O x3. no gross focal neurologic deficits, normal  speech Extremities: moves all, no edema, normal tone Psychiatry: normal mood, congruent affect, judgement and insight appear normal   Condition at discharge: stable  The results of significant diagnostics from this hospitalization (including imaging, microbiology, ancillary and laboratory) are listed below for reference.   Imaging Studies: DG Chest 1 View  Result Date: 08/18/2021 CLINICAL DATA:  [place], hip fracture, preoperative evaluation EXAM: CHEST  1 VIEW COMPARISON:  None. FINDINGS: Single frontal view of the chest demonstrates dual lead pacer overlying right chest. Cardiac silhouette is enlarged. Marked ectasia and atherosclerosis of the thoracic aorta. Mild central vascular congestion. Diffuse interstitial prominence could reflect interstitial edema or chronic lung scarring. No acute airspace disease, effusion, or pneumothorax. IMPRESSION: 1. Central vascular congestion and diffuse interstitial prominence, consistent with interstitial edema or chronic scarring. No acute airspace disease. Electronically Signed   By: Randa Ngo M.D.   On: 08/18/2021 22:27   CT HEAD WO CONTRAST  Result Date: 08/18/2021 CLINICAL DATA:  Head trauma. EXAM: CT HEAD WITHOUT CONTRAST TECHNIQUE: Contiguous axial images were obtained from the base of the skull through the vertex without intravenous contrast. COMPARISON:  None. FINDINGS: Brain: Moderate age-related atrophy and chronic microvascular ischemic changes. Old area of infarct with associated dystrophic calcification in the superior right cerebellum. There is no acute intracranial hemorrhage. No mass effect or midline shift. No extra-axial fluid collection. Vascular: No hyperdense vessel or unexpected calcification. Skull: Normal. Negative for fracture or focal lesion. Sinuses/Orbits: No acute finding. Other: None IMPRESSION: 1. No acute intracranial pathology. 2. Moderate age-related atrophy and chronic microvascular ischemic changes. Old right cerebellar  infarct. Electronically Signed   By: Anner Crete M.D.   On: 08/18/2021 23:38   CT Hip Right Wo Contrast  Result Date: 08/18/2021 CLINICAL DATA:  Fall. Hip trauma with fracture suspected. Pain with movement. EXAM: CT OF THE RIGHT HIP WITHOUT CONTRAST TECHNIQUE: Multidetector CT imaging of the right hip was performed according to the standard protocol. Multiplanar CT image reconstructions were also generated. COMPARISON:  Right hip radiographs 08/18/2021 FINDINGS: Bones/Joint/Cartilage Diffuse bone demineralization. Comminuted inter trochanteric fractures of the proximal right femur with fracture lines extending through the trochanteric region to the greater and lesser trochanters. Mild impaction without significant displacement of fracture fragments. No additional fractures demonstrated in the visualized acetabulum or right hemipelvis. Severe degenerative changes in the right hip with joint space narrowing, subcortical cyst formation, and prominent osteophyte formation. Ligaments Suboptimally assessed by CT. Muscles and Tendons Fatty atrophy of the right hip musculature. No significant intramuscular hematoma. Soft tissues Visualized pelvic organs appear intact.  Vascular calcifications. IMPRESSION: Comminuted intertrochanteric fractures of the proximal right femur without significant displacement. Severe degenerative changes in the right hip without dislocation. Electronically Signed   By: Lucienne Capers M.D.   On: 08/18/2021 23:44   DG Knee Complete 4 Views Right  Result Date: 08/18/2021 CLINICAL DATA:  [place], hip fracture, pain EXAM: RIGHT KNEE - COMPLETE 4+ VIEW COMPARISON:  08/18/2021 FINDINGS: Frontal, bilateral oblique, and cross-table lateral views of the right knee are obtained. No fracture, subluxation, or dislocation. There is mild 3 compartmental osteoarthritis. No joint effusion. Soft tissues are unremarkable. IMPRESSION: 1. Mild osteoarthritis.  No acute fracture. Electronically Signed    By: Randa Ngo M.D.   On: 08/18/2021 22:28   DG C-Arm 1-60 Min-No Report  Result Date: 08/20/2021 Fluoroscopy was utilized by the requesting physician.  No radiographic interpretation.   DG C-Arm 1-60 Min-No Report  Result Date: 08/20/2021 Fluoroscopy was utilized by the requesting physician.  No radiographic interpretation.   DG HIP UNILAT WITH PELVIS 2-3 VIEWS RIGHT  Result Date: 08/20/2021 CLINICAL DATA:  Postoperative. EXAM: DG HIP (WITH OR WITHOUT PELVIS) 2-3V RIGHT COMPARISON:  Right hip x-ray 08/18/2021. FINDINGS: There is a new short right femoral intramedullary nail and hip screw in place fixating a proximal femoral fracture. Alignment is anatomic. There are severe degenerative changes of the right hip with joint space narrowing, sclerosis and osteophyte formation similar to the prior study. There also moderate degenerative changes of the pubic symphysis, unchanged. There is right lateral hip soft tissue swelling and air compatible with recent surgery. IMPRESSION: 1. Right hip ORIF.  Alignment is anatomic. Electronically Signed   By: Ronney Asters M.D.   On: 08/20/2021 16:24   DG Hip Unilat With Pelvis 2-3 Views Right  Result Date: 08/18/2021 CLINICAL DATA:  Fall with hip pain EXAM: DG HIP (WITH OR WITHOUT PELVIS) 2-3V RIGHT COMPARISON:  None. FINDINGS: SI joints are non widened. Pubic symphysis and rami appear intact. Acute nondisplaced fracture on frog-leg view, appears to involve the lower trochanter and possibly the proximal shaft. Severe right  hip arthritis with bone on bone appearance, sclerosis and subarticular cysts. IMPRESSION: Acute nondisplaced right proximal femoral fracture appears to involve the lower trochanter and possibly the proximal shaft, CT could be obtained for more thorough fracture evaluation. Electronically Signed   By: Donavan Foil M.D.   On: 08/18/2021 21:41   DG FEMUR, MIN 2 VIEWS RIGHT  Result Date: 08/20/2021 CLINICAL DATA:  Surgery, elective EXAM:  RIGHT FEMUR 2 VIEWS COMPARISON:  None. FINDINGS: Multiple intraoperative images demonstrate right femur intramedullary rod placement as well as a dynamic screw traversing intertrochanteric fracture. IMPRESSION: Right femur fracture fixation. Electronically Signed   By: Macy Mis M.D.   On: 08/20/2021 15:26    Microbiology: Results for orders placed or performed during the hospital encounter of 08/18/21  Resp Panel by RT-PCR (Flu A&B, Covid) Nasopharyngeal Swab     Status: None   Collection Time: 08/18/21  9:56 PM   Specimen: Nasopharyngeal Swab; Nasopharyngeal(NP) swabs in vial transport medium  Result Value Ref Range Status   SARS Coronavirus 2 by RT PCR NEGATIVE NEGATIVE Final    Comment: (NOTE) SARS-CoV-2 target nucleic acids are NOT DETECTED.  The SARS-CoV-2 RNA is generally detectable in upper respiratory specimens during the acute phase of infection. The lowest concentration of SARS-CoV-2 viral copies this assay can detect is 138 copies/mL. A negative result does not preclude SARS-Cov-2 infection and should not be used as the sole basis for treatment or other patient management decisions. A negative result may occur with  improper specimen collection/handling, submission of specimen other than nasopharyngeal swab, presence of viral mutation(s) within the areas targeted by this assay, and inadequate number of viral copies(<138 copies/mL). A negative result must be combined with clinical observations, patient history, and epidemiological information. The expected result is Negative.  Fact Sheet for Patients:  EntrepreneurPulse.com.au  Fact Sheet for Healthcare Providers:  IncredibleEmployment.be  This test is no t yet approved or cleared by the [place] FDA and  has been authorized for detection and/or diagnosis of SARS-CoV-2 by FDA under an Emergency Use Authorization (EUA). This EUA will remain  in effect (meaning this test can be  used) for the duration of the COVID-19 declaration under Section 564(b)(1) of the Act, 21 U.S.C.section 360bbb-3(b)(1), unless the authorization is terminated  or revoked sooner.       Influenza A by PCR NEGATIVE NEGATIVE Final   Influenza B by PCR NEGATIVE NEGATIVE Final    Comment: (NOTE) The Xpert Xpress SARS-CoV-2/FLU/RSV plus assay is intended as an aid in the diagnosis of influenza from Nasopharyngeal swab specimens and should not be used as a sole basis for treatment. Nasal washings and aspirates are unacceptable for Xpert Xpress SARS-CoV-2/FLU/RSV testing.  Fact Sheet for Patients: EntrepreneurPulse.com.au  Fact Sheet for Healthcare Providers: IncredibleEmployment.be  This test is not yet approved or cleared by the [place] FDA and has been authorized for detection and/or diagnosis of SARS-CoV-2 by FDA under an Emergency Use Authorization (EUA). This EUA will remain in effect (meaning this test can be used) for the duration of the COVID-19 declaration under Section 564(b)(1) of the Act, 21 U.S.C. section 360bbb-3(b)(1), unless the authorization is terminated or revoked.  Performed at [facility], [place] [address] [place], [place] [address]   Surgical pcr screen     Status: None   Collection Time: 08/20/21  1:42 PM   Specimen: Nasal Mucosa; Nasal Swab  Result Value Ref Range Status   MRSA, PCR NEGATIVE NEGATIVE Final   Staphylococcus aureus  NEGATIVE NEGATIVE Final    Comment: (NOTE) The Xpert SA Assay (FDA approved for NASAL specimens in patients 76 years of age and older), is one component of a comprehensive surveillance program. It is not intended to diagnose infection nor to guide or monitor treatment. Performed at [facility], [place] [address] [place], [place] [address]   Resp Panel by RT-PCR (Flu A&B, Covid) Nasopharyngeal Swab     Status: None   Collection Time: 08/23/21  9:53 AM   Specimen:  Nasopharyngeal Swab; Nasopharyngeal(NP) swabs in vial transport medium  Result Value Ref Range Status   SARS Coronavirus 2 by RT PCR NEGATIVE NEGATIVE Final    Comment: (NOTE) SARS-CoV-2 target nucleic acids are NOT DETECTED.  The SARS-CoV-2 RNA is generally detectable in upper respiratory specimens during the acute phase of infection. The lowest concentration of SARS-CoV-2 viral copies this assay can detect is 138 copies/mL. A negative result does not preclude SARS-Cov-2 infection and should not be used as the sole basis for treatment or other patient management decisions. A negative result may occur with  improper specimen collection/handling, submission of specimen other than nasopharyngeal swab, presence of viral mutation(s) within the areas targeted by this assay, and inadequate number of viral copies(<138 copies/mL). A negative result must be combined with clinical observations, patient history, and epidemiological information. The expected result is Negative.  Fact Sheet for Patients:  EntrepreneurPulse.com.au  Fact Sheet for Healthcare Providers:  IncredibleEmployment.be  This test is no t yet approved or cleared by the [place] FDA and  has been authorized for detection and/or diagnosis of SARS-CoV-2 by FDA under an Emergency Use Authorization (EUA). This EUA will remain  in effect (meaning this test can be used) for the duration of the COVID-19 declaration under Section 564(b)(1) of the Act, 21 U.S.C.section 360bbb-3(b)(1), unless the authorization is terminated  or revoked sooner.       Influenza A by PCR NEGATIVE NEGATIVE Final   Influenza B by PCR NEGATIVE NEGATIVE Final    Comment: (NOTE) The Xpert Xpress SARS-CoV-2/FLU/RSV plus assay is intended as an aid in the diagnosis of influenza from Nasopharyngeal swab specimens and should not be used as a sole basis for treatment. Nasal washings and aspirates are unacceptable for  Xpert Xpress SARS-CoV-2/FLU/RSV testing.  Fact Sheet for Patients: EntrepreneurPulse.com.au  Fact Sheet for Healthcare Providers: IncredibleEmployment.be  This test is not yet approved or cleared by the [place] FDA and has been authorized for detection and/or diagnosis of SARS-CoV-2 by FDA under an Emergency Use Authorization (EUA). This EUA will remain in effect (meaning this test can be used) for the duration of the COVID-19 declaration under Section 564(b)(1) of the Act, 21 U.S.C. section 360bbb-3(b)(1), unless the authorization is terminated or revoked.  Performed at [facility], [place] [address] [place], [place] [address]     Labs: CBC: Recent Labs  Lab 08/18/21 2114 08/20/21 0240 08/21/21 0238 08/22/21 0215  WBC 12.4* 12.5* 12.4* 10.2  NEUTROABS 9.3*  --   --   --   HGB 13.3 15.3* 12.4 11.6*  HCT 43.0 47.9* 38.9 35.9*  MCV 94.5 91.8 93.5 91.8  PLT 208 251 271 702   Basic Metabolic Panel: Recent Labs  Lab 08/18/21 2114 08/20/21 0240 08/21/21 0238 08/22/21 0215  NA 136 137 137 139  K 4.3 3.9 3.9 3.7  CL 104 102 99 101  CO2 20* 24 26 28   GLUCOSE 196* 123* 169* 127*  BUN 17 11 17  21  CREATININE 0.76 0.73 0.87 0.78  CALCIUM 8.8* 9.6 9.0 9.1   Liver Function Tests: Recent Labs  Lab 08/18/21 2114  AST 28  ALT 21  ALKPHOS 82  BILITOT 1.0  PROT 6.1*  ALBUMIN 3.9   CBG: Recent Labs  Lab 08/22/21 1133 08/22/21 1623 08/22/21 1956 08/23/21 0554 08/23/21 0851  GLUCAP 169* 135* 140* 119* 121*    Discharge time spent: greater than 30 minutes.  Signed:  Ezekiel Slocumb MD.  [facility] 08/23/2021

## 2021-08-23 NOTE — Progress Notes (Signed)
PATIENT ID: Monica Murillo  MRN: [id number]  DOB/AGE:  01/30/1933 / 88 y.o.  3 Days Post-Op Procedure(s) (LRB): INTRAMEDULLARY (IM) NAIL INTERTROCHANTRIC (Right)    PROGRESS NOTE Subjective:   Patient is alert, oriented, no Nausea, no Vomiting, yes passing gas, no Bowel Movement. Taking PO well with pt eating breakfast. Denies SOB, Chest or Calf Pain. Using Incentive Spirometer, PAS in place. Ambulate WBAT with pt working with therapy, Patient reports pain as moderate,     Objective: Vital signs in last 24 hours: Temp:  [97.6 F (36.4 C)-98.2 F (36.8 C)] 97.9 F (36.6 C) (01/02 0700) Pulse Rate:  [62-85] 63 (01/02 0700) Resp:  [14-21] 16 (01/02 0700) BP: (129-159)/(57-90) 156/68 (01/02 0700) SpO2:  [93 %-99 %] 98 % (01/02 0700)    Intake/Output from previous day: I/O last 3 completed shifts: In: 120 [P.O.:120] Out: 300 [Urine:300]   Intake/Output this shift: No intake/output data recorded.   LABORATORY DATA: Recent Labs    08/21/21 0238 08/21/21 0902 08/22/21 0215 08/22/21 0740 08/22/21 1956 08/23/21 0554 08/23/21 0851  WBC 12.4*  --  10.2  --   --   --   --   HGB 12.4  --  11.6*  --   --   --   --   HCT 38.9  --  35.9*  --   --   --   --   PLT 271  --  251  --   --   --   --   NA 137  --  139  --   --   --   --   K 3.9  --  3.7  --   --   --   --   CL 99  --  101  --   --   --   --   CO2 26  --  28  --   --   --   --   BUN 17  --  21  --   --   --   --   CREATININE 0.87  --  0.78  --   --   --   --   GLUCOSE 169*  --  127*  --   --   --   --   GLUCAP  --    < >  --    < > 140* 119* 121*  CALCIUM 9.0  --  9.1  --   --   --   --    < > = values in this interval not displayed.    Examination: Neurologically intact Neurovascular intact Sensation intact distally Intact pulses distally Dorsiflexion/Plantar flexion intact Incision: dressing C/D/I and no drainage No cellulitis present Compartment soft}  Assessment:   3 Days Post-Op Procedure(s)  (LRB): INTRAMEDULLARY (IM) NAIL INTERTROCHANTRIC (Right) ADDITIONAL DIAGNOSIS:  CAD, with history of PCI and stents, dual-chamber pacemaker for complete heart block, paroxysmal atrial fibrillation on Xarelto, interstitial lung disease/pulmonary fibrosis  Plan:       POST OPERATIVE INSTRUCTIONS: Mobility: Out of bed with PT/OT Pain control: Continue to wean/titrate to appropriate oral regimen DVT Prophylaxis: Pt has restarted normal Xarelto dose Further surgical plans: None RUE: No restrictions LUE: No restrictions RLE: Weightbearing as tolerated, range of motion as tolerated LLE: No restrictions Disposition: Per primary team Dressing care: Keep Mepilex on and dry for 5 days.  Do not allow surgical area to get wet before that.  Remove Mepilex dressing after 5 days and allow area to  get wet in shower but DO NOT SUBMERGE. Follow-up: Please call [facility] [phone]) to schedule follow-op appointment for 2 weeks after surgery. I have verified that my discharge instructions and follow-up information has been entered in the Discharge Navigator in Epic.  These should automatically populate in the AVS.  Please print the AVS in its entirety and ensure that the patient or a responsible party has a complete copy of the AVS before they are discharged.  If there are questions regarding discharge instructions or follow-up before the AVS is generated, please check the Discharge Navigator before attempting to contact the surgeon/office.  If unsure how to access the Discharge Navigator or the information contained in the Discharge Navigator, or how to generate/print the AVS, please contact the appropriate Nurse, learning disability.    DISCHARGE PLAN: Skilled Nursing Facility/Rehab is recommended by PT when medically safe and bed available   DISCHARGE NEEDS: HHPT, Walker, and 3-in-1 comode seat     Joanell Rising 08/23/2021, 9:06 AM

## 2021-08-23 NOTE — Care Management Important Message (Signed)
Important Message  Patient Details  Name: Monica Murillo MRN: [id number] Date of Birth: 06-11-1933   Medicare Important Message Given:  Yes     Hannah Beat 08/23/2021, 12:40 PM

## 2021-08-25 ENCOUNTER — Other Ambulatory Visit (HOSPITAL_COMMUNITY): Payer: Medicare Other

## 2021-08-31 ENCOUNTER — Ambulatory Visit (HOSPITAL_COMMUNITY): Payer: Medicare Other

## 2021-09-02 ENCOUNTER — Ambulatory Visit: Payer: Medicare Other | Admitting: Orthopedic Surgery

## 2021-09-03 ENCOUNTER — Ambulatory Visit (HOSPITAL_COMMUNITY): Payer: Medicare Other

## 2021-09-10 ENCOUNTER — Institutional Professional Consult (permissible substitution): Payer: Medicare Other | Admitting: Cardiology

## 2021-09-16 ENCOUNTER — Encounter: Payer: Self-pay | Admitting: Orthopedic Surgery

## 2021-09-16 ENCOUNTER — Ambulatory Visit (HOSPITAL_COMMUNITY): Payer: Medicare Other

## 2021-09-16 ENCOUNTER — Encounter: Payer: Medicare Other | Admitting: Orthopedic Surgery

## 2021-09-16 ENCOUNTER — Telehealth: Payer: Self-pay

## 2021-09-16 ENCOUNTER — Other Ambulatory Visit: Payer: Self-pay

## 2021-09-16 NOTE — Telephone Encounter (Signed)
I connected with  Monica Murillo on 09/16/21 by a video enabled telemedicine application and verified that I am speaking with the correct person using two identifiers.   I discussed the limitations of evaluation and management by telemedicine. The patient expressed understanding and agreed to proceed.

## 2021-09-16 NOTE — Progress Notes (Deleted)
°  This service is provided via telemedicine  No vital signs collected/recorded due to the encounter was a telemedicine visit.   Location of patient (ex: home, work):  [employer].  Patient consents to a telephone visit:  Alyse Low the daughter consented  Location of the provider (ex: office, home):  [facility]  Name of any referring provider:  Windell Moulding, NP  Names of all persons participating in the telemedicine service and their role in the encounter:  Daughter Alyse Low, Patient Majel Giel and Windell Moulding, NP  Time spent on call:  10 mins

## 2021-09-17 NOTE — Progress Notes (Signed)
This encounter was created in error - please disregard.

## 2021-09-21 ENCOUNTER — Ambulatory Visit: Payer: Medicare Other | Admitting: Pulmonary Disease

## 2021-09-23 ENCOUNTER — Other Ambulatory Visit (HOSPITAL_COMMUNITY): Payer: Medicare Other

## 2021-09-27 ENCOUNTER — Ambulatory Visit: Payer: Medicare Other | Admitting: Orthopedic Surgery

## 2021-09-30 ENCOUNTER — Ambulatory Visit: Payer: Medicare Other | Admitting: Orthopedic Surgery

## 2021-10-05 ENCOUNTER — Ambulatory Visit (HOSPITAL_COMMUNITY): Payer: Medicare Other

## 2021-10-11 ENCOUNTER — Telehealth: Payer: Self-pay | Admitting: *Deleted

## 2021-10-11 NOTE — Telephone Encounter (Signed)
Monica Murillo with [facility] PT called requesting verbal orders for PT 1X1week, 2x4weeks, 1X4weeks.   Patient Discharged home with Home Health from Rehab.   Verbal orders given.  Patient has follow up appointment with Amy 10/14/2021 @ 3:15

## 2021-10-13 ENCOUNTER — Ambulatory Visit: Payer: Medicare Other | Admitting: Pulmonary Disease

## 2021-10-13 ENCOUNTER — Telehealth: Payer: Self-pay | Admitting: *Deleted

## 2021-10-13 NOTE — Telephone Encounter (Signed)
[name] with [facility] called requesting verbal orders for OT 1X6weeks.  Verbal orders given.  Patient has an appointment with Amy 10/14/2021.

## 2021-10-14 ENCOUNTER — Ambulatory Visit: Payer: Medicare Other | Admitting: Orthopedic Surgery

## 2021-10-18 ENCOUNTER — Ambulatory Visit: Payer: Medicare Other | Admitting: Orthopedic Surgery

## 2021-10-18 ENCOUNTER — Ambulatory Visit (HOSPITAL_COMMUNITY)
Admission: RE | Admit: 2021-10-18 | Discharge: 2021-10-18 | Disposition: A | Discharge status: Home / Self Care | Payer: Medicare Other | Source: Ambulatory Visit | Attending: Pulmonary Disease | Admitting: Pulmonary Disease

## 2021-10-18 DIAGNOSIS — J849 Interstitial pulmonary disease, unspecified: Secondary | ICD-10-CM | POA: Insufficient documentation

## 2021-10-18 IMAGING — CT CT CHEST HIGH RESOLUTION
2 of 6 series · 13 of 36 positions shown, 16 images · non-contrast
Comparison: No priors.

CLINICAL DATA: 88-year-old female with history of interstitial lung
disease.



[Series 4: thorax · axial · 0.57mm/px · z∈[-323,-57]mm · 10 of 149 slices shown, 13 images]
[im 8/149  mediastinal]
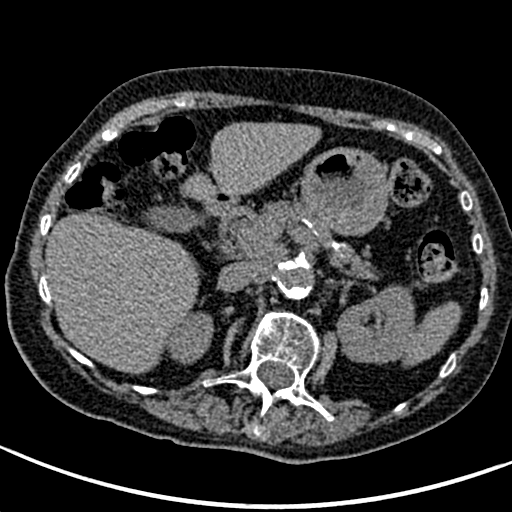
[im 8/149  lung]
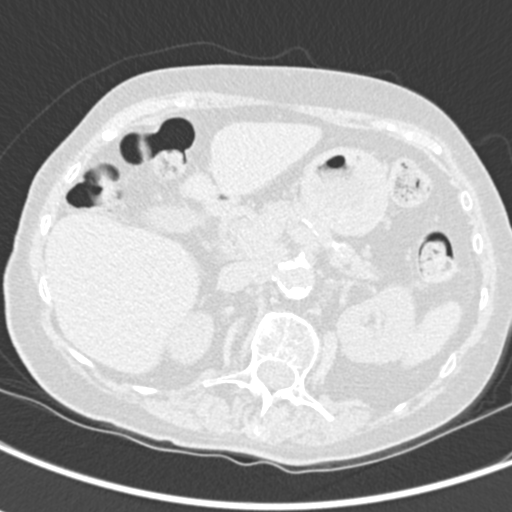
[im 24/149  lung]
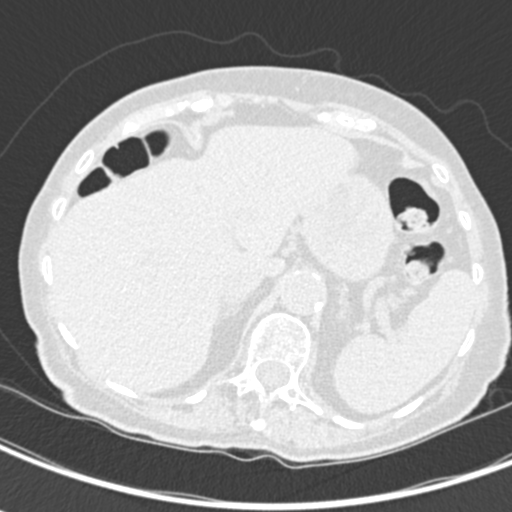
[im 39/149  lung]
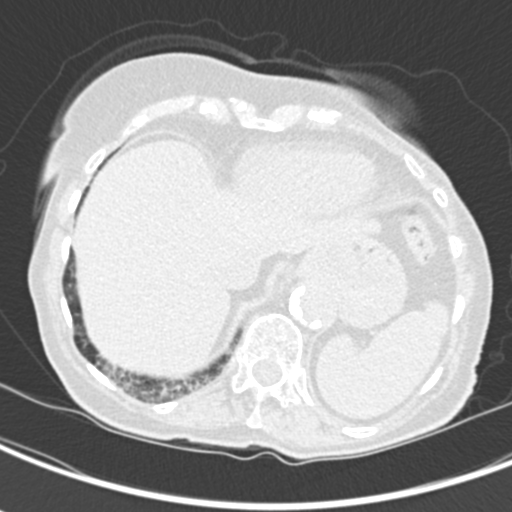
[im 55/149  lung]
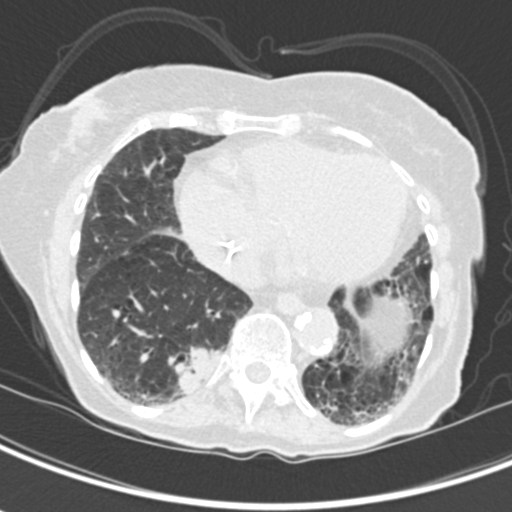
[im 71/149  mediastinal]
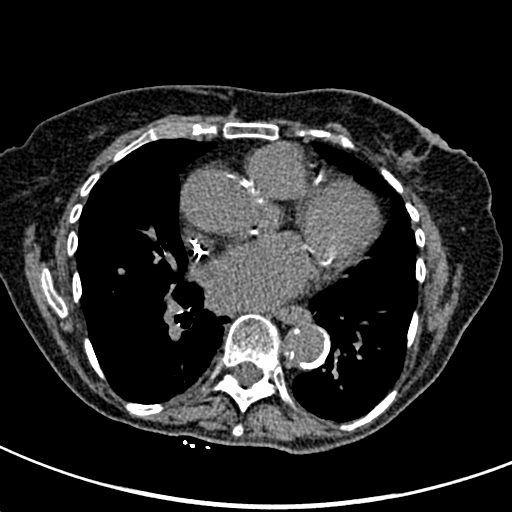
[im 71/149  lung]
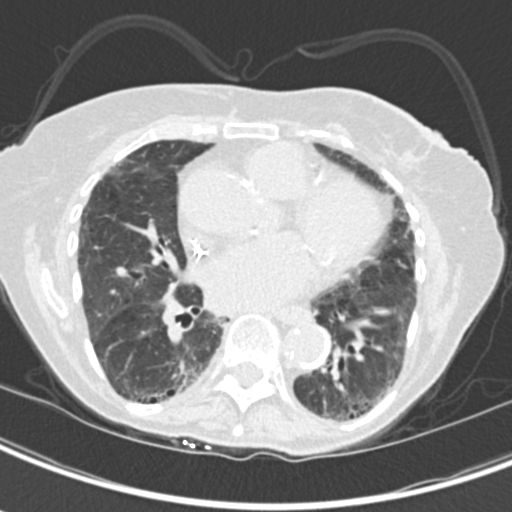
[im 78/149  lung]
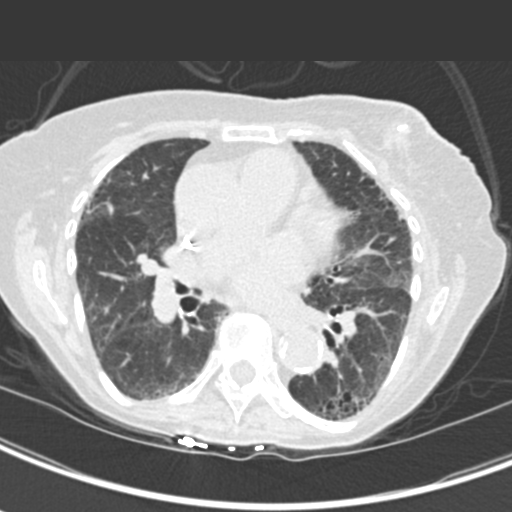
[im 94/149  lung]
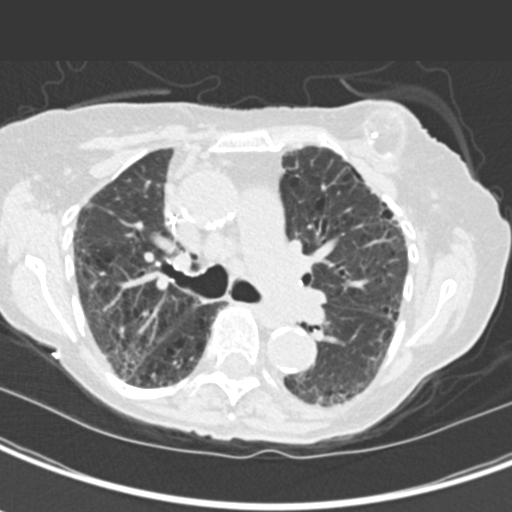
[im 110/149  lung]
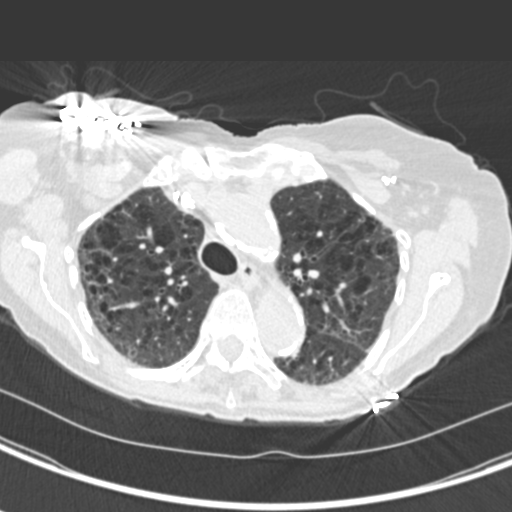
[im 125/149  mediastinal]
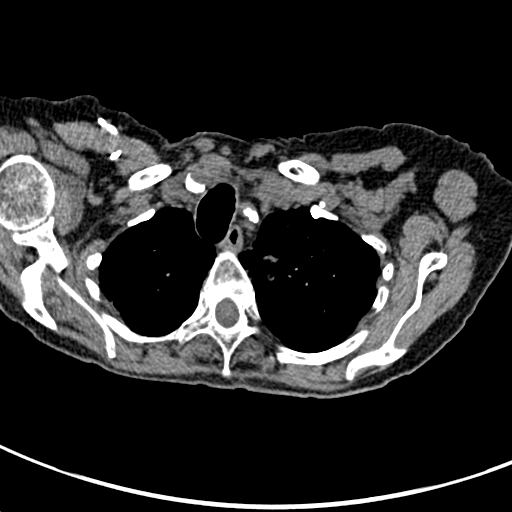
[im 125/149  lung]
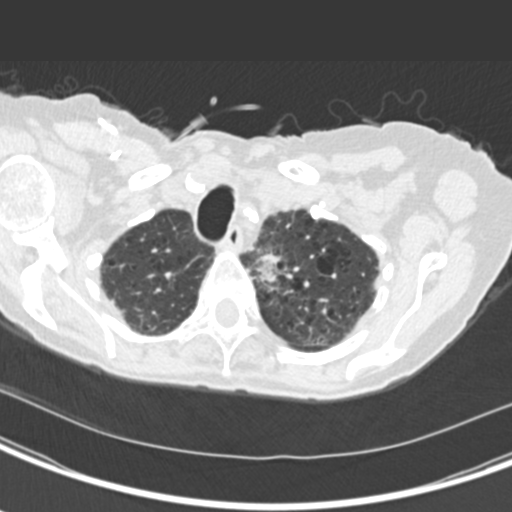
[im 141/149  lung]
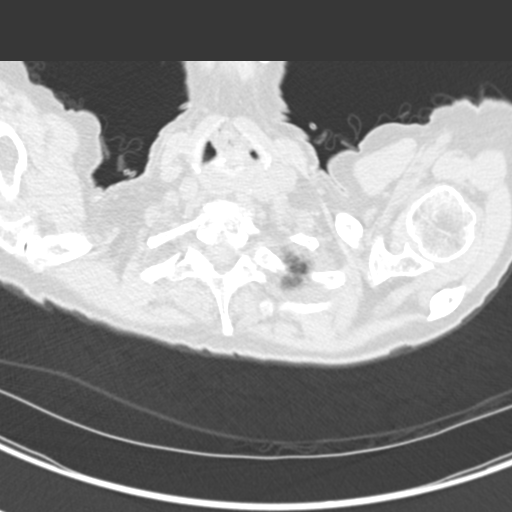

[Series 8: coronal · coronal · 0.55mm/px · 3 of 112 slices shown]
[im 23/112  lung]
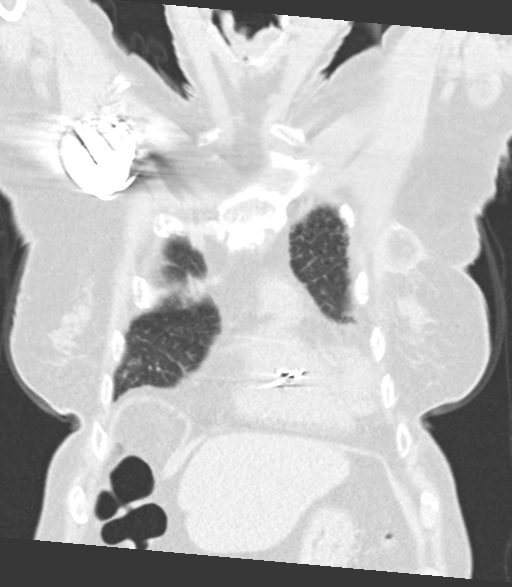
[im 45/112  lung]
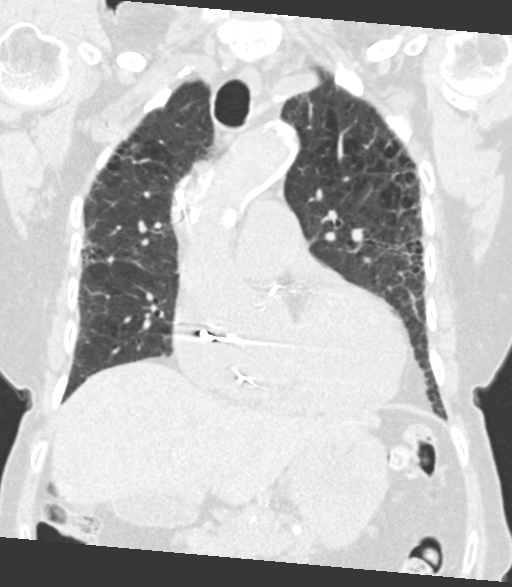
[im 67/112  lung]
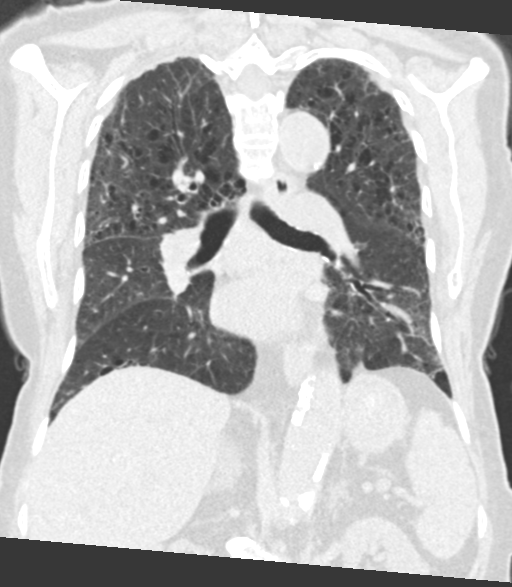

[13 of 36 positions shown; findings below may reference images not displayed]

FINDINGS: Cardiovascular: Heart size is normal. There is no significant
pericardial fluid, thickening or pericardial calcification. There is
aortic atherosclerosis, as well as atherosclerosis of the great
vessels of the mediastinum and the coronary arteries, including
calcified atherosclerotic plaque in the left main, left anterior
descending, left circumflex and right coronary arteries. Right-sided
pacemaker device in place with lead tips terminating in the right
atrium and right ventricle.

Mediastinum/Nodes: Multiple prominent but nonenlarged mediastinal
and hilar lymph nodes are noted. No definite pathologically enlarged
mediastinal or hilar lymph nodes are noted on today's noncontrast CT
examination. Esophagus is unremarkable in appearance. No axillary
lymphadenopathy.

Lungs/Pleura: In the posteromedial aspect of the right lower lobe
(axial image 97 of series 7 and sagittal image 56 of series 9) there
is a 3.2 x 1.8 x 2.6 cm macrolobulated mass with internal air
bronchograms, making broad contact with the pleura posteriorly,
highly concerning for primary bronchogenic neoplasm. No other
suspicious appearing pulmonary nodules or masses are noted.
High-resolution images demonstrate widespread areas of septal
thickening, subpleural reticulation, traction bronchiectasis and
peripheral bronchiolectasis, most evident in the mid to lower lungs
bilaterally, indicative of interstitial lung disease. Inspiratory
and expiratory imaging demonstrates some mild air trapping
indicative of mild small airways disease. No acute consolidative
airspace disease. No pleural effusions. There is also diffuse
bronchial wall thickening with moderate centrilobular and paraseptal
emphysema.

Upper Abdomen: Aortic atherosclerosis. 1.5 x 1.2 cm well-defined
low-attenuation lesion in segment 7 of the liver, incompletely
characterized on today's non-contrast CT examination, but
statistically likely to represent a cyst. Aortic atherosclerosis.

Musculoskeletal: Chronic appearing compression fractures of T3, T5,
T8 and T10, most severe at T8 where there is 40% loss of anterior
vertebral body height. There are no aggressive appearing lytic or
blastic lesions noted in the visualized portions of the skeleton.
IMPRESSION: 1. 3.2 x 1.8 x 2.6 cm macrolobulated mass in the right lower lobe
highly concerning for primary bronchogenic neoplasm. Further
evaluation with PET-CT is recommended in the near future.
2. Multiple prominent but nonenlarged mediastinal and bilateral
hilar lymph nodes, nonspecific in the setting of interstitial lung
disease. Attention at time of PET-CT is recommended.
3. There are changes in the lungs indicative of interstitial lung
disease, with a spectrum of findings categorized as probable usual
interstitial pneumonia (UIP) per current ATS guidelines. Repeat
high-resolution chest CT is suggested in 12 months to assess for
temporal changes in the appearance of the lung parenchyma.
4. There is also diffuse bronchial wall thickening with moderate
centrilobular and paraseptal emphysema; imaging findings suggestive
of underlying COPD.
5. Aortic atherosclerosis, in addition to left main and three-vessel
coronary artery disease.

These results will be called to the ordering clinician or
representative by the Radiologist Assistant, and communication
documented in the PACS or [REDACTED].

Aortic Atherosclerosis ([T3]-[T3]) and Emphysema ([T3]-[T3]).

## 2021-10-20 ENCOUNTER — Encounter: Payer: Self-pay | Admitting: Orthopedic Surgery

## 2021-10-20 ENCOUNTER — Telehealth: Payer: Self-pay | Admitting: Pulmonary Disease

## 2021-10-20 DIAGNOSIS — R918 Other nonspecific abnormal finding of lung field: Secondary | ICD-10-CM

## 2021-10-20 NOTE — Telephone Encounter (Signed)
I spoke with patient's daughter, Adonis Brook, and informed her of th CT scan results. She has agreed to move forward with PET CT scan next for further evaluation.  ? ?Order for PET scan has been placed.  ? ?Nothing further needed at this time. ? ?JD ?

## 2021-10-20 NOTE — Telephone Encounter (Signed)
Received call report on CT form Diane with [facility].  ? ? IMPRESSION: ?1. 3.2 x 1.8 x 2.6 cm macrolobulated mass in the right lower lobe ?highly concerning for primary bronchogenic neoplasm. Further ?evaluation with PET-CT is recommended in the near future. ? ?Dr. Erin Fulling, please advise.  ?

## 2021-10-21 ENCOUNTER — Encounter: Payer: Self-pay | Admitting: Adult Health

## 2021-10-21 ENCOUNTER — Ambulatory Visit (INDEPENDENT_AMBULATORY_CARE_PROVIDER_SITE_OTHER): Payer: Medicare Other | Admitting: Adult Health

## 2021-10-21 ENCOUNTER — Other Ambulatory Visit: Payer: Self-pay

## 2021-10-21 VITALS — BP 116/62 | HR 64 | Temp 97.5°F | Ht 60.0 in | Wt 112.4 lb

## 2021-10-21 DIAGNOSIS — I2510 Atherosclerotic heart disease of native coronary artery without angina pectoris: Secondary | ICD-10-CM | POA: Diagnosis not present

## 2021-10-21 DIAGNOSIS — S72144S Nondisplaced intertrochanteric fracture of right femur, sequela: Secondary | ICD-10-CM

## 2021-10-21 DIAGNOSIS — I442 Atrioventricular block, complete: Secondary | ICD-10-CM

## 2021-10-21 DIAGNOSIS — I480 Paroxysmal atrial fibrillation: Secondary | ICD-10-CM

## 2021-10-21 DIAGNOSIS — I952 Hypotension due to drugs: Secondary | ICD-10-CM

## 2021-10-21 DIAGNOSIS — J8410 Pulmonary fibrosis, unspecified: Secondary | ICD-10-CM

## 2021-10-21 DIAGNOSIS — E039 Hypothyroidism, unspecified: Secondary | ICD-10-CM

## 2021-10-21 MED ORDER — LEVOTHYROXINE SODIUM 125 MCG PO TABS
125.0000 ug | ORAL_TABLET | Freq: Every day | ORAL | 2 refills | Status: DC
Start: 2021-10-21 — End: 2021-11-22

## 2021-10-21 MED ORDER — LEVOTHYROXINE SODIUM 125 MCG PO TABS: 125.0000 ug | ORAL_TABLET | Freq: Every day | ORAL | 2 refills | Status: DC

## 2021-10-21 NOTE — Patient Instructions (Signed)
Diabetes Mellitus Action Plan °Following a diabetes action plan is a way for you to manage your diabetes (diabetes mellitus) symptoms. The plan is color-coded to help you understand what actions you need to take based on any symptoms you are having. °If you have symptoms in the red zone, you need medical care right away. °If you have symptoms in the yellow zone, you are having problems. °If you have symptoms in the green zone, you are doing well. °Learning about and understanding diabetes can take time. Follow the plan that you develop with your health care provider. Know the target range for your blood sugar (glucose) level, and review your treatment plan with your health care provider at each visit. °The target range for my blood sugar level is __________________________ mg/dL. °Red zone °Get medical help right away if you have any of the following symptoms: °A blood sugar test result that is below 54 mg/dL (3 mmol/L). °A blood sugar test result that is at or above 240 mg/dL (13.3 mmol/L) for 2 days in a row. °Confusion or trouble thinking clearly. °Difficulty breathing. °Sickness or a fever for 2 or more days that is not getting better. °Moderate or large ketone levels in your urine. °Feeling tired or having no energy. °If you have any red zone symptoms, do not wait to see if the symptoms will go away. Get medical help right away. Call your local emergency services (911 in the [place] Do not drive yourself to the hospital. °If you have severely low blood sugar (severe hypoglycemia) and you cannot eat or drink, you may need glucagon. Make sure a family member or close friend knows how to check your blood sugar and how to give you glucagon. You may need to be treated in a hospital for this condition. °Yellow zone °If you have any of the following symptoms, your diabetes is not under control and you may need to make some changes: °A blood sugar test result that is at or above 240 mg/dL (13.3 mmol/L) for 2 days in a  row. °Blood sugar test results that are below 70 mg/dL (3.9 mmol/L). °Other symptoms of hypoglycemia, such as: °Shaking or feeling light-headed. °Confusion or irritability. °Feeling hungry. °Having a fast heartbeat. °If you have any yellow zone symptoms: °Treat your hypoglycemia by eating or drinking 15 grams of a rapid-acting carbohydrate. Follow the 15:15 rule: °Take 15 grams of a rapid-acting carbohydrate, such as: °1 tube of glucose gel. °4 glucose pills. °4 oz (120 mL) of fruit juice. °4 oz (120 mL) of regular (not diet) soda. °Check your blood sugar 15 minutes after you take the carbohydrate. °If the repeat blood sugar test is still at or below 70 mg/dL (3.9 mmol/L), take 15 grams of a carbohydrate again. °If your blood sugar does not increase above 70 mg/dL (3.9 mmol/L) after 3 tries, get medical help right away. °After your blood sugar returns to normal, eat a meal or a snack within 1 hour. °Keep taking your daily medicines as told by your health care provider. °Check your blood sugar more often than you normally would. °Write down your results. °Call your health care provider if you have trouble keeping your blood sugar in your target range. ° °Green zone °These signs mean you are doing well and you can continue what you are doing to manage your diabetes: °Your blood sugar is within your personal target range. For most people, a blood sugar level before a meal (preprandial) should be 80-130 mg/dL (4.4-7.2 mmol/L). °You feel   well, and you are able to do daily activities. °If you are in the green zone, continue to manage your diabetes as told by your health care provider. To do this: °Eat a healthy diet. °Exercise regularly. °Check your blood sugar as told by your health care provider. °Take your medicines as told by your health care provider. ° °Where to find more information °American Diabetes Association (ADA): diabetes.org °Association of Diabetes Care & Education Specialists (ADCES):  diabeteseducator.org °Summary °Following a diabetes action plan is a way for you to manage your diabetes symptoms. The plan is color-coded to help you understand what actions you need to take based on any symptoms you are having. °Follow the plan that you develop with your health care provider. Make sure you know your personal target blood sugar level. °Review your treatment plan with your health care provider at each visit. °This information is not intended to replace advice given to you by your health care provider. Make sure you discuss any questions you have with your health care provider. °Document Revised: 02/13/2020 Document Reviewed: 02/13/2020 °Elsevier Patient Education © 2022 Elsevier Inc. ° °

## 2021-10-21 NOTE — Progress Notes (Signed)
[facility] clinic  Provider:   Durenda Age DNP  Code Status:   DNR  Goals of Care:  Advanced Directives 10/21/2021  Does Patient Have a Medical Advance Directive? Yes  Type of Advance Directive Living will;Healthcare Power of [place];Out of facility DNR (pink MOST or yellow form)  Does patient want to make changes to medical advance directive? No - Patient declined  Copy of [place] in Chart? Yes - validated most recent copy scanned in chart (See row information)     Chief Complaint  Patient presents with   Follow-up    Follow up from Rehab. Foot exam today. Discuss the need for eye exam, Dexa scan, urine microalbumin, Tdap, Shingrix, and Pneumonia vaccine, or post pone if patient refuses. Patient is present with daughter and is wondering why she is taking Novasc. Patient is requesting changing her levothyroxine (synthriod) to name brand.     HPI: Patient is an 88 y.o. female seen today for an acute visit, accompanied by daughter, for hospitalization follow up post rehabilitation after hospital admission 08/18/22 to 08/23/21. She has a PMH of CAD, with history of PCI and stents, dual-chamber pacemaker for complete heart block, paroxysmal atrial fibrillation on Xarelto, interstitial lung disease/pulmonary fibrosis on 2 L O2. She  recently moved to [facility] and currently living at [facility]. She had a fall while attempting to sit backwards onto a wheelchair and landed on her right hip. She was brought to ED wherein x-ray showed right proximal femur fracture. Orthopedics was consulted and performed IM nailing on 12/30. She had short-term rehabilitation at [facility] on 08/23/21 to 10/08/21. Short-term rehabilitation stay was complicated by having [id number] infection. She was put on isolation only since she had no symptoms. At home, she is currently having PT 2X a week and OT X1 a week. BP at home was 100/60. Daughter has not given patient's Amlodipine ever since  she got home. BP today at 116/66. Patient denies dizziness.She is on  O2 @ 2L/min continuously due to pulmonary fibrosis. She is requesting for name brand Synthroid instead of Levothyroxine.    Past Medical History:  Diagnosis Date   Anemia    Heart block    History of blood transfusion    History of heart artery stent    Hypertension    Hypothyroid    Pacemaker    Pulmonary fibrosis (HCC)    Respiratory failure, chronic ([place])    Type 2 diabetes mellitus ([place])     Past Surgical History:  Procedure Laterality Date   ABDOMINAL HYSTERECTOMY  1981   BREAST LUMPECTOMY  2011   CARDIAC PACEMAKER PLACEMENT  2018   CORONARY STENT PLACEMENT  2012   HIP FRACTURE SURGERY Right 08/20/2021   Per patients daughter   INTRAMEDULLARY (IM) NAIL INTERTROCHANTERIC Right 08/20/2021   Procedure: INTRAMEDULLARY (IM) NAIL INTERTROCHANTRIC;  Surgeon: [name], MD;  Location: [facility];  Service: Orthopedics;  Laterality: Right;    Allergies  Allergen Reactions   Lidocaine-Epinephrine Anxiety    Dizzy, disoriented, "cannot receive at the dentist"   Sulfa Antibiotics Nausea And Vomiting and Rash    Sulfa and related      Outpatient Encounter Medications as of 10/21/2021  Medication Sig   Acetaminophen (TYLENOL 8 HOUR ARTHRITIS PAIN PO) Take 600 mg by mouth in the morning, at noon, in the evening, and at bedtime.   docusate sodium (COLACE) 100 MG capsule Take 100 mg by mouth daily.   Esomeprazole Magnesium (NEXIUM PO) Take  1 capsule by mouth in the morning.   levothyroxine (SYNTHROID) 125 MCG tablet Take 125 mcg by mouth daily. NBO   Lutein-Zeaxanthin 20-1 MG CAPS Take 1 capsule by mouth in the morning.   METOPROLOL SUCCINATE PO Take 25 mg by mouth daily.   OXYGEN Inhale into the lungs. 2 Liters   rivaroxaban (XARELTO) 10 MG TABS tablet Take 10 mg by mouth daily.   amLODipine (NORVASC) 10 MG tablet Take 1 tablet (10 mg total) by mouth daily. (Patient not taking: Reported on 10/21/2021)    [DISCONTINUED] docusate sodium (COLACE) 100 MG capsule Take 1 capsule (100 mg total) by mouth 2 (two) times daily. (Patient not taking: Reported on 10/21/2021)   [DISCONTINUED] feeding supplement (ENSURE ENLIVE / ENSURE PLUS) LIQD Take 237 mLs by mouth 2 (two) times daily between meals. (Patient not taking: Reported on 09/16/2021)   [DISCONTINUED] Multiple Vitamin (MULTIVITAMIN WITH MINERALS) TABS tablet Take 1 tablet by mouth daily. (Patient not taking: Reported on 10/21/2021)   [DISCONTINUED] polyethylene glycol (MIRALAX / GLYCOLAX) 17 g packet Take 17 g by mouth daily as needed for mild constipation. (Patient not taking: Reported on 09/16/2021)   [DISCONTINUED] Rivaroxaban (XARELTO) 15 MG TABS tablet Take 1 tablet (15 mg total) by mouth daily with supper. (Patient not taking: Reported on 10/21/2021)   No facility-administered encounter medications on file as of 10/21/2021.    Review of Systems:  Review of Systems  Constitutional:  Negative for appetite change, chills, fatigue and fever.  HENT:  Negative for congestion, hearing loss, rhinorrhea and sore throat.   Eyes: Negative.   Respiratory:  Negative for cough, shortness of breath and wheezing.   Cardiovascular:  Negative for chest pain, palpitations and leg swelling.  Gastrointestinal:  Negative for abdominal pain, constipation, diarrhea, nausea and vomiting.  Genitourinary:  Negative for dysuria.  Skin:  Negative for color change, rash and wound.  Neurological:  Negative for dizziness, weakness and headaches.  Psychiatric/Behavioral:  Negative for behavioral problems. The patient is not nervous/anxious.    Health Maintenance  Topic Date Due   FOOT EXAM  Never done   OPHTHALMOLOGY EXAM  Never done   URINE MICROALBUMIN  Never done   TETANUS/TDAP  Never done   Zoster Vaccines- Shingrix (1 of 2) Never done   Pneumonia Vaccine 52+ Years old (1 - PCV) Never done   DEXA SCAN  Never done   HEMOGLOBIN A1C  01/27/2022   INFLUENZA VACCINE   Completed   COVID-19 Vaccine  Completed   HPV VACCINES  Aged Out    Physical Exam: Vitals:   10/21/21 0933  Temp: (!) 97.5 F (36.4 C)  Weight: 112 lb 6.4 oz (51 kg)  Height: 5' (1.524 m)   Body mass index is 21.95 kg/m. Physical Exam Constitutional:      General: She is not in acute distress.    Appearance: Normal appearance.  HENT:     Head: Normocephalic and atraumatic.     Nose: Nose normal.     Mouth/Throat:     Mouth: Mucous membranes are moist.  Eyes:     Conjunctiva/sclera: Conjunctivae normal.  Cardiovascular:     Rate and Rhythm: Normal rate. Rhythm irregular.     Comments: Right chest pacemaker Pulmonary:     Effort: Pulmonary effort is normal.     Breath sounds: Normal breath sounds.     Comments: On 2L/min continuously Abdominal:     General: Bowel sounds are normal.     Palpations: Abdomen is soft.  Musculoskeletal:        General: Normal range of motion.     Cervical back: Normal range of motion.  Skin:    General: Skin is warm and dry.  Neurological:     General: No focal deficit present.     Mental Status: She is alert and oriented to person, place, and time.  Psychiatric:        Mood and Affect: Mood normal.        Behavior: Behavior normal.        Thought Content: Thought content normal.        Judgment: Judgment normal.    Labs reviewed: Basic Metabolic Panel: Recent Labs    07/29/21 1431 08/18/21 2114 08/20/21 0240 08/21/21 0238 08/22/21 0215  NA 141   < > 137 137 139  K 4.4   < > 3.9 3.9 3.7  CL 104   < > 102 99 101  CO2 26   < > 24 26 28   GLUCOSE 103   < > 123* 169* 127*  BUN 14   < > 11 17 21   CREATININE 0.76   < > 0.73 0.87 0.78  CALCIUM 9.7   < > 9.6 9.0 9.1  TSH 1.35  --   --   --   --    < > = values in this interval not displayed.   Liver Function Tests: Recent Labs    07/29/21 1431 08/18/21 2114  AST 17 28  ALT 18 21  ALKPHOS  --  82  BILITOT 0.5 1.0  PROT 6.9 6.1*  ALBUMIN  --  3.9   No results for  input(s): LIPASE, AMYLASE in the last 8760 hours. No results for input(s): AMMONIA in the last 8760 hours. CBC: Recent Labs    07/29/21 1431 08/18/21 2114 08/20/21 0240 08/21/21 0238 08/22/21 0215  WBC 7.2 12.4* 12.5* 12.4* 10.2  NEUTROABS 4,478 9.3*  --   --   --   HGB 13.3 13.3 15.3* 12.4 11.6*  HCT 40.7 43.0 47.9* 38.9 35.9*  MCV 88.1 94.5 91.8 93.5 91.8  PLT 309 208 251 271 251   Lipid Panel: No results for input(s): CHOL, HDL, LDLCALC, TRIG, CHOLHDL, LDLDIRECT in the last 8760 hours. Lab Results  Component Value Date   HGBA1C 6.7 (H) 07/29/2021    Procedures since last visit: CT CHEST HIGH RESOLUTION  Result Date: 10/20/2021 CLINICAL DATA:  88 year old female with history of interstitial lung disease. EXAM: CT CHEST WITHOUT CONTRAST TECHNIQUE: Multidetector CT imaging of the chest was performed following the standard protocol without intravenous contrast. High resolution imaging of the lungs, as well as inspiratory and expiratory imaging, was performed. RADIATION DOSE REDUCTION: This exam was performed according to the departmental dose-optimization program which includes automated exposure control, adjustment of the mA and/or kV according to patient size and/or use of iterative reconstruction technique. COMPARISON:  No priors. FINDINGS: Cardiovascular: Heart size is normal. There is no significant pericardial fluid, thickening or pericardial calcification. There is aortic atherosclerosis, as well as atherosclerosis of the great vessels of the mediastinum and the coronary arteries, including calcified atherosclerotic plaque in the left main, left anterior descending, left circumflex and right coronary arteries. Right-sided pacemaker device in place with lead tips terminating in the right atrium and right ventricle. Mediastinum/Nodes: Multiple prominent but nonenlarged mediastinal and hilar lymph nodes are noted. No definite pathologically enlarged mediastinal or hilar lymph nodes are  noted on today's noncontrast CT examination. Esophagus is unremarkable in appearance. No  axillary lymphadenopathy. Lungs/Pleura: In the posteromedial aspect of the right lower lobe (axial image 97 of series 7 and sagittal image 56 of series 9) there is a 3.2 x 1.8 x 2.6 cm macrolobulated mass with internal air bronchograms, making broad contact with the pleura posteriorly, highly concerning for primary bronchogenic neoplasm. No other suspicious appearing pulmonary nodules or masses are noted. High-resolution images demonstrate widespread areas of septal thickening, subpleural reticulation, traction bronchiectasis and peripheral bronchiolectasis, most evident in the mid to lower lungs bilaterally, indicative of interstitial lung disease. Inspiratory and expiratory imaging demonstrates some mild air trapping indicative of mild small airways disease. No acute consolidative airspace disease. No pleural effusions. There is also diffuse bronchial wall thickening with moderate centrilobular and paraseptal emphysema. Upper Abdomen: Aortic atherosclerosis. 1.5 x 1.2 cm well-defined low-attenuation lesion in segment 7 of the liver, incompletely characterized on today's non-contrast CT examination, but statistically likely to represent a cyst. Aortic atherosclerosis. Musculoskeletal: Chronic appearing compression fractures of T3, T5, T8 and T10, most severe at T8 where there is 40% loss of anterior vertebral body height. There are no aggressive appearing lytic or blastic lesions noted in the visualized portions of the skeleton. IMPRESSION: 1. 3.2 x 1.8 x 2.6 cm macrolobulated mass in the right lower lobe highly concerning for primary bronchogenic neoplasm. Further evaluation with PET-CT is recommended in the near future. 2. Multiple prominent but nonenlarged mediastinal and bilateral hilar lymph nodes, nonspecific in the setting of interstitial lung disease. Attention at time of PET-CT is recommended. 3. There are changes in  the lungs indicative of interstitial lung disease, with a spectrum of findings categorized as probable usual interstitial pneumonia (UIP) per current ATS guidelines. Repeat high-resolution chest CT is suggested in 12 months to assess for temporal changes in the appearance of the lung parenchyma. 4. There is also diffuse bronchial wall thickening with moderate centrilobular and paraseptal emphysema; imaging findings suggestive of underlying COPD. 5. Aortic atherosclerosis, in addition to left main and three-vessel coronary artery disease. These results will be called to the ordering clinician or representative by the Radiologist Assistant, and communication documented in the PACS or [employer]. Aortic Atherosclerosis (ICD10-I70.0) and Emphysema (ICD10-J43.9). Electronically Signed   By: Vinnie Langton M.D.   On: 10/20/2021 07:02    Assessment/Plan  1. Closed nondisplaced intertrochanteric fracture of right femur, sequela S/P IM nailing on 12/30 by Dr. Mable Fill -  continue Home health PT and OT for therapeutic strengthening exercises -  fall precautions -  follow up with orthopedics  2. Hypotension due to drugs -  -   will discontinue Amlodipine -  instructed daughter to monitor BP and log  3. Pulmonary fibrosis ([place]) -  continue O2 @ 2L/min via [place]  4. PAF (paroxysmal atrial fibrillation) (HCC) -  rate-controlled -  continue Xarelto for anticoagulation and Metoprolol succinate for rate-control  5. Hypothyroidism, unspecified type Lab Results  Component Value Date   TSH 1.35 07/29/2021   - levothyroxine (SYNTHROID) 125 MCG tablet; Take 1 tablet (125 mcg total) by mouth daily before breakfast. Wants the Name Brand  Dispense: 90 tablet; Refill: 2  6.  Complete heart block (HCC) -   has pacemaker     Labs/tests ordered:  None   Next appt:  as needed

## 2021-10-22 ENCOUNTER — Ambulatory Visit (HOSPITAL_COMMUNITY): Payer: Medicare Other | Attending: Cardiology

## 2021-10-22 DIAGNOSIS — I480 Paroxysmal atrial fibrillation: Secondary | ICD-10-CM | POA: Diagnosis present

## 2021-10-22 DIAGNOSIS — I2510 Atherosclerotic heart disease of native coronary artery without angina pectoris: Secondary | ICD-10-CM | POA: Diagnosis present

## 2021-10-22 LAB — ECHOCARDIOGRAM COMPLETE
Area-P 1/2: 4.89 cm2
P 1/2 time: 457 msec
S' Lateral: 2.8 cm

## 2021-10-25 ENCOUNTER — Other Ambulatory Visit: Payer: Self-pay

## 2021-10-25 ENCOUNTER — Encounter: Payer: Self-pay | Admitting: Cardiology

## 2021-10-25 ENCOUNTER — Ambulatory Visit (INDEPENDENT_AMBULATORY_CARE_PROVIDER_SITE_OTHER): Payer: Medicare Other | Admitting: Cardiology

## 2021-10-25 VITALS — BP 122/58 | HR 59 | Ht 60.0 in | Wt 111.4 lb

## 2021-10-25 DIAGNOSIS — I442 Atrioventricular block, complete: Secondary | ICD-10-CM | POA: Diagnosis not present

## 2021-10-25 DIAGNOSIS — I480 Paroxysmal atrial fibrillation: Secondary | ICD-10-CM

## 2021-10-25 NOTE — Patient Instructions (Signed)
Medication Instructions:  ?Your physician recommends that you continue on your current medications as directed. Please refer to the Current Medication list given to you today. ? ?*If you need a refill on your cardiac medications before your next appointment, please call your pharmacy* ? ? ?Lab Work: ?None ordered ? ? ?Testing/Procedures: ?None ordered ? ? ?Follow-Up: ?At [facility], you and your health needs are our priority.  As part of our continuing mission to provide you with exceptional heart care, we have created designated Provider Care Teams.  These Care Teams include your primary Cardiologist (physician) and Advanced Practice Providers (APPs -  Physician Assistants and Nurse Practitioners) who all work together to provide you with the care you need, when you need it. ? ?Remote monitoring is used to monitor your Pacemaker or ICD from home. This monitoring reduces the number of office visits required to check your device to one time per year. It allows [place] to keep an eye on the functioning of your device to ensure it is working properly. You are scheduled for a device check from home on 01/24/2022. You may send your transmission at any time that day. If you have a wireless device, the transmission will be sent automatically. After your physician reviews your transmission, you will receive a postcard with your next transmission date. ? ?Your next appointment:   ?1 year(s) ? ?The format for your next appointment:   ?In Person ? ?Provider:   ?You will see one of the following Advanced Practice Providers on your designated Care Team:   ?Tommye Standard, PA-C ?Legrand Como "Jonni Sanger" [place], PA-C ? ? ?Thank you for choosing [facility]!! ? ? ?Trinidad Curet, RN ?[phone] ? ? ?Other Instructions ? ? ?Device clinic  [phone] ?

## 2021-10-25 NOTE — Progress Notes (Signed)
? ?Electrophysiology Office Note ? ? ?Date:  10/25/2021  ? ?ID:  Monica Murillo, DOB 06/02/1933, MRN [id number] ? ?PCP:  Yvonna Alanis, NP  ?Cardiologist:  Tobb ?Primary Electrophysiologist:  [name], MD   ? ?Chief Complaint: pacemaker, AF ?  ?History of Present Illness: ?Monica Murillo is a 88 y.o. female who is being seen today for the evaluation of pacemaker, AF at the request of Tobb, [name], DO. Presenting today for electrophysiology evaluation. ? ?She has a history significant for coronary artery disease status post stent in 2012, complete heart block status post [facility] dual-chamber pacemaker 03/15/2017, paroxysmal atrial fibrillation, hyperlipidemia, pulmonary fibrosis.  She recently moved to [place] to live closer to her daughter.  She presents to establish care.  Unfortunately in December, she had a fall and had a femur fracture.  She required surgery.  She is in a wheelchair today and walks with a walker. ? ?Today, she denies symptoms of palpitations, chest pain, shortness of breath, orthopnea, PND, lower extremity edema, claudication, dizziness, presyncope, syncope, bleeding, or neurologic sequela. The patient is tolerating medications without difficulties.  ? ? ?Past Medical History:  ?Diagnosis Date  ? Anemia   ? Heart block   ? History of blood transfusion   ? History of heart artery stent   ? Hypertension   ? Hypothyroid   ? Pacemaker   ? Pulmonary fibrosis ([place])   ? Respiratory failure, chronic ([place])   ? Type 2 diabetes mellitus ([place])   ? ?Past Surgical History:  ?Procedure Laterality Date  ? ABDOMINAL HYSTERECTOMY  1981  ? BREAST LUMPECTOMY  2011  ? CARDIAC PACEMAKER PLACEMENT  2018  ? CORONARY STENT PLACEMENT  2012  ? HIP FRACTURE SURGERY Right 08/20/2021  ? Per patients daughter  ? INTRAMEDULLARY (IM) NAIL INTERTROCHANTERIC Right 08/20/2021  ? Procedure: INTRAMEDULLARY (IM) NAIL INTERTROCHANTRIC;  Surgeon: [name], MD;  Location: [facility];  Service: Orthopedics;   Laterality: Right;  ? ? ? ?Current Outpatient Medications  ?Medication Sig Dispense Refill  ? Acetaminophen (TYLENOL 8 HOUR ARTHRITIS PAIN PO) Take 600 mg by mouth in the morning, at noon, in the evening, and at bedtime.    ? docusate sodium (COLACE) 100 MG capsule Take 100 mg by mouth daily.    ? Esomeprazole Magnesium (NEXIUM PO) Take 1 capsule by mouth in the morning.    ? levothyroxine (SYNTHROID) 125 MCG tablet Take 1 tablet (125 mcg total) by mouth daily before breakfast. Wants the Name Brand 90 tablet 2  ? Lutein-Zeaxanthin 20-1 MG CAPS Take 1 capsule by mouth in the morning.    ? METOPROLOL SUCCINATE PO Take 25 mg by mouth daily.    ? OXYGEN Inhale into the lungs. 2 Liters    ? rivaroxaban (XARELTO) 10 MG TABS tablet Take 10 mg by mouth daily.    ? ?No current facility-administered medications for this visit.  ? ? ?Allergies:   Lidocaine-epinephrine and Sulfa antibiotics  ? ?Social History:  The patient  reports that she has quit smoking. Her smoking use included cigarettes. She has a 20.00 pack-year smoking history. She has never been exposed to tobacco smoke. She has never used smokeless tobacco. She reports current alcohol use. She reports that she does not use drugs.  ? ?Family History:  The patient's family history includes Dementia in her sister; Other in her mother.  ? ? ?ROS:  Please see the history of present illness.   Otherwise, review of systems is positive for  none.   All other systems are reviewed and negative.  ? ? ?PHYSICAL EXAM: ?VS:  BP (!) 122/58   Pulse (!) 59   Ht 5' (1.524 m)   Wt 111 lb 6.4 oz (50.5 kg)   SpO2 96%   BMI 21.76 kg/m?  , BMI Body mass index is 21.76 kg/m?. ?GEN: Well nourished, well developed, in no acute distress  ?HEENT: normal  ?Neck: no JVD, carotid bruits, or masses ?Cardiac: RRR; no murmurs, rubs, or gallops,no edema  ?Respiratory:  clear to auscultation bilaterally, normal work of breathing ?GI: soft, nontender, nondistended, + BS ?MS: no deformity or atrophy   ?Skin: warm and dry, device pocket is well healed ?Neuro:  Strength and sensation are intact ?Psych: euthymic mood, full affect ? ?EKG:  EKG is not ordered today. ?Personal review of the ekg ordered 08/19/21 shows sinus rhythm, PVC, LVH, rate 89 ? ?Device interrogation is reviewed today in detail.  See PaceArt for details. ? ? ?Recent Labs: ?07/29/2021: TSH 1.35 ?08/18/2021: ALT 21 ?08/22/2021: BUN 21; Creatinine, Ser 0.78; Hemoglobin 11.6; Platelets 251; Potassium 3.7; Sodium 139  ? ? ?Lipid Panel  ?No results found for: CHOL, TRIG, HDL, CHOLHDL, VLDL, LDLCALC, LDLDIRECT ? ? ?Wt Readings from Last 3 Encounters:  ?10/25/21 111 lb 6.4 oz (50.5 kg)  ?10/21/21 112 lb 6.4 oz (51 kg)  ?08/10/21 116 lb 6.4 oz (52.8 kg)  ?  ? ? ?Other studies Reviewed: ?Additional studies/ records that were reviewed today include: TTE 10/22/21  ?Review of the above records today demonstrates:  ? 1. Left ventricular ejection fraction, by estimation, is 55 to 60%. The  ?left ventricle has normal function. The left ventricle has no regional  ?wall motion abnormalities. There is mild left ventricular hypertrophy.  ?Left ventricular diastolic parameters  ?are indeterminate.  ? 2. Right ventricular systolic function is normal. The right ventricular  ?size is normal.  ? 3. The mitral valve is normal in structure. Mild mitral valve  ?regurgitation. No evidence of mitral stenosis.  ? 4. The aortic valve is tricuspid. Aortic valve regurgitation is mild.  ?Aortic valve sclerosis is present, with no evidence of aortic valve  ?stenosis.  ? 5. The inferior vena cava is normal in size with greater than 50%  ?respiratory variability, suggesting right atrial pressure of 3 mmHg.  ? ? ?ASSESSMENT AND PLAN: ? ?1.  Paroxysmal atrial fibrillation: Currently on Xarelto 10 mg daily.  CHA2DS2-VASc of at least 4.  She has remained in sinus rhythm.  She has minimal atrial fibrillation symptoms.  We [name] continue with current management. ? ?2.  Complete heart block:  Status post Catering manager.  Device functioning appropriately.  No changes at this time.  We Tramya Schoenfelder enroll in remote monitoring through device clinic. ? ?3.  Coronary artery disease: No current chest pain.  Plan per primary cardiology. ? ? ? ?Current medicines are reviewed at length with the patient today.   ?The patient does not have concerns regarding her medicines.  The following changes were made today:  none ? ?Labs/ tests ordered today include:  ?No orders of the defined types were placed in this encounter. ? ? ? ?Disposition:   FU with Noell Shular 1 year ? ?Signed, ?Aerika Groll Meredith Leeds, MD  ?10/25/2021 9:23 AM    ? ?[facility] ?[address] ?[address] ?[place] [address] ?[phone] (office) ?[phone] (fax) ? ?

## 2021-11-01 ENCOUNTER — Ambulatory Visit: Payer: Medicare Other | Admitting: Pulmonary Disease

## 2021-11-09 ENCOUNTER — Other Ambulatory Visit: Payer: Self-pay

## 2021-11-09 ENCOUNTER — Ambulatory Visit (HOSPITAL_COMMUNITY)
Admission: RE | Admit: 2021-11-09 | Discharge: 2021-11-09 | Disposition: A | Discharge status: Home / Self Care | Payer: Medicare Other | Source: Ambulatory Visit | Attending: Pulmonary Disease | Admitting: Pulmonary Disease

## 2021-11-09 ENCOUNTER — Ambulatory Visit: Payer: Medicare Other | Admitting: Pulmonary Disease

## 2021-11-09 DIAGNOSIS — R918 Other nonspecific abnormal finding of lung field: Secondary | ICD-10-CM | POA: Insufficient documentation

## 2021-11-09 DIAGNOSIS — C7951 Secondary malignant neoplasm of bone: Secondary | ICD-10-CM | POA: Insufficient documentation

## 2021-11-09 LAB — GLUCOSE, CAPILLARY: Glucose-Capillary: 108 mg/dL — ABNORMAL HIGH (ref 70–99)

## 2021-11-09 IMAGING — PT NM PET TUM IMG INITIAL (PI) SKULL BASE T - THIGH
1 of 8 series · 1 of 25 positions shown · non-contrast
Comparison: Chest CT [DATE]

CLINICAL DATA: Initial treatment strategy for lung mass.

EXAM:
NUCLEAR MEDICINE PET SKULL BASE TO THIGH
TECHNIQUE: 5.6 mCi F-18 FDG was injected intravenously. Full-ring PET imaging
was performed from the skull base to thigh after the radiotracer. CT
data was obtained and used for attenuation correction and anatomic
localization.
Fasting blood glucose: 108 mg/dl

[Series 4: ct sk_thigh 5.0 bf37 · axial · 5.0mm · 0.98mm/px · 1 of 201 slices shown]
[im 201/201  brain]
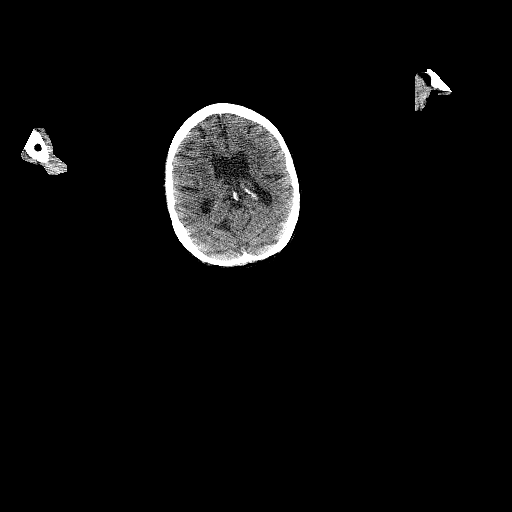

[1 of 25 positions shown; findings below may reference images not displayed]

FINDINGS: Mediastinal blood pool activity: SUV max

Liver activity: SUV max 0

NECK: No hypermetabolic lymph nodes in the neck.

Incidental CT findings: none

CHEST: Rounded mass in the RIGHT lower lobe measures 3.1 x 2.3 cm
and has intense metabolic activity with SUV max equal 11.8. Mass
there is slightly larger than recent CT chest.

Hypermetabolic subcarinal node along the RIGHT bronchus intermedius
measures 11 mm short axis (image 52/4) with SUV max equal
(image 53).

No supraclavicular lymphadenopathy

Incidental CT findings: Severe interstitial lung disease.
Benign-appearing fatty lesion in the LEFT breast without metabolic
activity. Pacer device in the RIGHT chest wall.

ABDOMEN/PELVIS: Adrenal glands are normal. No abnormal metabolic
activity in the liver. No hypermetabolic abdominopelvic lymph nodes.

Incidental CT findings: none

SKELETON: There is as lesion the central sacrum with intense
metabolic activity (SUV max equal 6.4 on image 134. Soft tissue
density at this site measuring 13 mm (image 133/4). Hypermetabolic
lesion the posterior LEFT iliac bone with SUV max equal 6.3 on same
image.

Hypermetabolic lesion in the LEFT iliac wing with SUV max equal 5.4.

Hypermetabolic lesion the T7 vertebral body with SUV max equal 4.7.

Additional lesion in RIGHT anterior rib rib (image 77)

Incidental CT findings: none
IMPRESSION: 1. Hypermetabolic RIGHT lobe mass most consistent bronchogenic
carcinoma.
2. Hypermetabolic subcarinal lymph node most consistent with
mediastinal nodal metastasis.
3. No supraclavicular nodal metastasis.
4. Three hypermetabolic lesions in the bony pelvis consistent with
skeletal metastasis. Minimal CT change. Additional metastatic
skeletal lesions in the T7 vertebral body and a RIGHT anterior rib.

## 2021-11-09 MED ORDER — FLUDEOXYGLUCOSE F - 18 (FDG) INJECTION
5.8000 | Freq: Once | INTRAVENOUS | Status: AC
  Administered 2021-11-09: 5.55 via INTRAVENOUS

## 2021-11-10 ENCOUNTER — Other Ambulatory Visit: Payer: Self-pay | Admitting: Orthopedic Surgery

## 2021-11-10 ENCOUNTER — Other Ambulatory Visit: Payer: Medicare Other

## 2021-11-10 DIAGNOSIS — I480 Paroxysmal atrial fibrillation: Secondary | ICD-10-CM

## 2021-11-10 DIAGNOSIS — E1165 Type 2 diabetes mellitus with hyperglycemia: Secondary | ICD-10-CM

## 2021-11-10 DIAGNOSIS — E039 Hypothyroidism, unspecified: Secondary | ICD-10-CM

## 2021-11-10 DIAGNOSIS — K219 Gastro-esophageal reflux disease without esophagitis: Secondary | ICD-10-CM

## 2021-11-12 ENCOUNTER — Telehealth: Payer: Self-pay | Admitting: Pulmonary Disease

## 2021-11-12 NOTE — Telephone Encounter (Signed)
I updated patient's daughter, Monica Murillo, about the PET scan results which are concerning for hypermetabolic activity of the right lower lobe mass along with increased metabolic activity at station 7 of the mediastinal lymph nodes.  I also let them know about increased activity within the pelvis.  These findings are concerning for stage III versus stage IV metastatic lung cancer. ? ?She will discuss these results with her mother and call our office back if she would like to move forward with scheduling a procedure for biopsy.  I reviewed the risks and benefits of EBUS, navigational bronchoscopy and percutaneous biopsy via interventional radiology. ? ?Patient's daughter is to call us back if they would like to move forward with procedure prior to their upcoming follow-up visit in April. ? ?Freda Jackson, MD ?[facility] ?Office: [phone] ? ? ?See Am[place] for personal pager ?PCCM on call pager [phone] until 7pm. ?Please call Elink 7p-7a. [phone] ? ?

## 2021-11-21 ENCOUNTER — Emergency Department (HOSPITAL_COMMUNITY): Payer: Medicare Other

## 2021-11-21 ENCOUNTER — Observation Stay (HOSPITAL_COMMUNITY)
Admission: EM | Admit: 2021-11-21 | Discharge: 2021-11-22 | Disposition: A | Discharge status: Home Health | Payer: Medicare Other | Attending: Internal Medicine | Admitting: Internal Medicine

## 2021-11-21 ENCOUNTER — Other Ambulatory Visit: Payer: Self-pay

## 2021-11-21 DIAGNOSIS — G459 Transient cerebral ischemic attack, unspecified: Secondary | ICD-10-CM

## 2021-11-21 DIAGNOSIS — E119 Type 2 diabetes mellitus without complications: Secondary | ICD-10-CM | POA: Insufficient documentation

## 2021-11-21 DIAGNOSIS — J9611 Chronic respiratory failure with hypoxia: Secondary | ICD-10-CM

## 2021-11-21 DIAGNOSIS — I442 Atrioventricular block, complete: Secondary | ICD-10-CM | POA: Diagnosis present

## 2021-11-21 DIAGNOSIS — Z955 Presence of coronary angioplasty implant and graft: Secondary | ICD-10-CM | POA: Diagnosis not present

## 2021-11-21 DIAGNOSIS — R413 Other amnesia: Secondary | ICD-10-CM | POA: Diagnosis present

## 2021-11-21 DIAGNOSIS — I10 Essential (primary) hypertension: Secondary | ICD-10-CM | POA: Diagnosis not present

## 2021-11-21 DIAGNOSIS — E039 Hypothyroidism, unspecified: Secondary | ICD-10-CM | POA: Insufficient documentation

## 2021-11-21 DIAGNOSIS — R29898 Other symptoms and signs involving the musculoskeletal system: Secondary | ICD-10-CM

## 2021-11-21 DIAGNOSIS — C3490 Malignant neoplasm of unspecified part of unspecified bronchus or lung: Secondary | ICD-10-CM

## 2021-11-21 DIAGNOSIS — R2689 Other abnormalities of gait and mobility: Secondary | ICD-10-CM | POA: Diagnosis not present

## 2021-11-21 DIAGNOSIS — Z853 Personal history of malignant neoplasm of breast: Secondary | ICD-10-CM | POA: Insufficient documentation

## 2021-11-21 DIAGNOSIS — I2510 Atherosclerotic heart disease of native coronary artery without angina pectoris: Secondary | ICD-10-CM | POA: Diagnosis not present

## 2021-11-21 DIAGNOSIS — I480 Paroxysmal atrial fibrillation: Secondary | ICD-10-CM | POA: Insufficient documentation

## 2021-11-21 DIAGNOSIS — R4701 Aphasia: Principal | ICD-10-CM | POA: Insufficient documentation

## 2021-11-21 DIAGNOSIS — Z20822 Contact with and (suspected) exposure to covid-19: Secondary | ICD-10-CM | POA: Diagnosis not present

## 2021-11-21 DIAGNOSIS — Z7902 Long term (current) use of antithrombotics/antiplatelets: Secondary | ICD-10-CM | POA: Insufficient documentation

## 2021-11-21 DIAGNOSIS — R479 Unspecified speech disturbances: Secondary | ICD-10-CM

## 2021-11-21 DIAGNOSIS — Z950 Presence of cardiac pacemaker: Secondary | ICD-10-CM | POA: Insufficient documentation

## 2021-11-21 DIAGNOSIS — D649 Anemia, unspecified: Secondary | ICD-10-CM

## 2021-11-21 DIAGNOSIS — J8410 Pulmonary fibrosis, unspecified: Secondary | ICD-10-CM

## 2021-11-21 DIAGNOSIS — R918 Other nonspecific abnormal finding of lung field: Secondary | ICD-10-CM | POA: Diagnosis not present

## 2021-11-21 DIAGNOSIS — Z87891 Personal history of nicotine dependence: Secondary | ICD-10-CM | POA: Diagnosis not present

## 2021-11-21 DIAGNOSIS — G9389 Other specified disorders of brain: Secondary | ICD-10-CM | POA: Diagnosis not present

## 2021-11-21 DIAGNOSIS — Z79899 Other long term (current) drug therapy: Secondary | ICD-10-CM | POA: Insufficient documentation

## 2021-11-21 LAB — CBC WITH DIFFERENTIAL/PLATELET
Abs Immature Granulocytes: 0.05 10*3/uL (ref 0.00–0.07)
Basophils Absolute: 0.1 10*3/uL (ref 0.0–0.1)
Basophils Relative: 1 %
Eosinophils Absolute: 0.8 10*3/uL — ABNORMAL HIGH (ref 0.0–0.5)
Eosinophils Relative: 8 %
HCT: 34.2 % — ABNORMAL LOW (ref 36.0–46.0)
Hemoglobin: 10.8 g/dL — ABNORMAL LOW (ref 12.0–15.0)
Immature Granulocytes: 1 %
Lymphocytes Relative: 19 %
Lymphs Abs: 2 10*3/uL (ref 0.7–4.0)
MCH: 29.1 pg (ref 26.0–34.0)
MCHC: 31.6 g/dL (ref 30.0–36.0)
MCV: 92.2 fL (ref 80.0–100.0)
Monocytes Absolute: 0.8 10*3/uL (ref 0.1–1.0)
Monocytes Relative: 8 %
Neutro Abs: 6.6 10*3/uL (ref 1.7–7.7)
Neutrophils Relative %: 63 %
Platelets: 301 10*3/uL (ref 150–400)
RBC: 3.71 MIL/uL — ABNORMAL LOW (ref 3.87–5.11)
RDW: 12.7 % (ref 11.5–15.5)
WBC: 10.4 10*3/uL (ref 4.0–10.5)
nRBC: 0 % (ref 0.0–0.2)

## 2021-11-21 LAB — URINALYSIS, ROUTINE W REFLEX MICROSCOPIC
Bilirubin Urine: NEGATIVE
Glucose, UA: NEGATIVE mg/dL
Hgb urine dipstick: NEGATIVE
Ketones, ur: NEGATIVE mg/dL
Leukocytes,Ua: NEGATIVE
Nitrite: NEGATIVE
Protein, ur: NEGATIVE mg/dL
Specific Gravity, Urine: 1.014 (ref 1.005–1.030)
pH: 6 (ref 5.0–8.0)

## 2021-11-21 LAB — COMPREHENSIVE METABOLIC PANEL
ALT: 12 U/L (ref 0–44)
AST: 15 U/L (ref 15–41)
Albumin: 3.5 g/dL (ref 3.5–5.0)
Alkaline Phosphatase: 85 U/L (ref 38–126)
Anion gap: 7 (ref 5–15)
BUN: 14 mg/dL (ref 8–23)
CO2: 26 mmol/L (ref 22–32)
Calcium: 9 mg/dL (ref 8.9–10.3)
Chloride: 106 mmol/L (ref 98–111)
Creatinine, Ser: 0.68 mg/dL (ref 0.44–1.00)
GFR, Estimated: >60 mL/min (ref >60)
Glucose, Bld: 115 mg/dL — ABNORMAL HIGH (ref 70–99)
Potassium: 3.7 mmol/L (ref 3.5–5.1)
Sodium: 139 mmol/L (ref 135–145)
Total Bilirubin: 0.5 mg/dL (ref 0.3–1.2)
Total Protein: 6.1 g/dL — ABNORMAL LOW (ref 6.5–8.1)

## 2021-11-21 LAB — PROTIME-INR
INR: 2.1 — ABNORMAL HIGH (ref 0.8–1.2)
Prothrombin Time: 23.3 seconds — ABNORMAL HIGH (ref 11.4–15.2)

## 2021-11-21 LAB — TSH: TSH: 0.062 u[IU]/mL — ABNORMAL LOW (ref 0.350–4.500)

## 2021-11-21 LAB — MAGNESIUM: Magnesium: 1.7 mg/dL (ref 1.7–2.4)

## 2021-11-21 LAB — CBG MONITORING, ED: Glucose-Capillary: 107 mg/dL — ABNORMAL HIGH (ref 70–99)

## 2021-11-21 IMAGING — CT CT HEAD W/O CM
4 series · 15 of 47 positions shown, 17 images · non-contrast
Comparison: [DATE]

CLINICAL DATA: Transient ischemic attack



[Series 2: head without · axial · non-contrast · 0.39mm/px · z∈[-71,+44]mm · 7 of 31 slices shown, 9 images]
[im 4/31  brain]
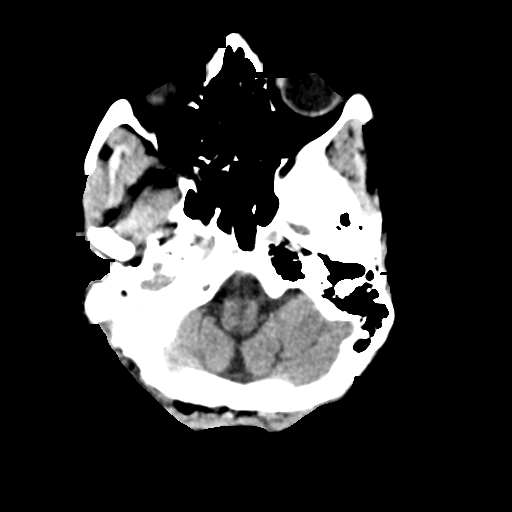
[im 4/31  bone]
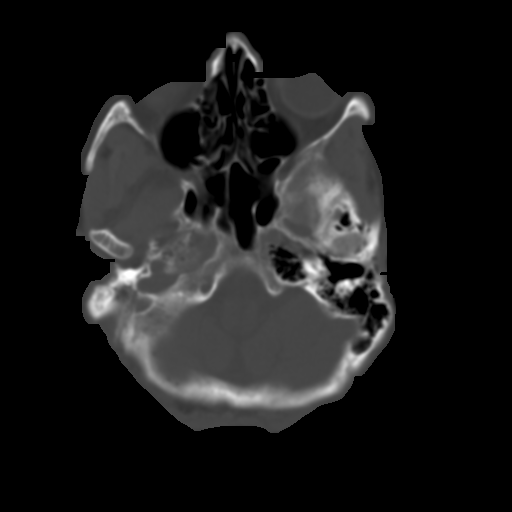
[im 8/31  brain]
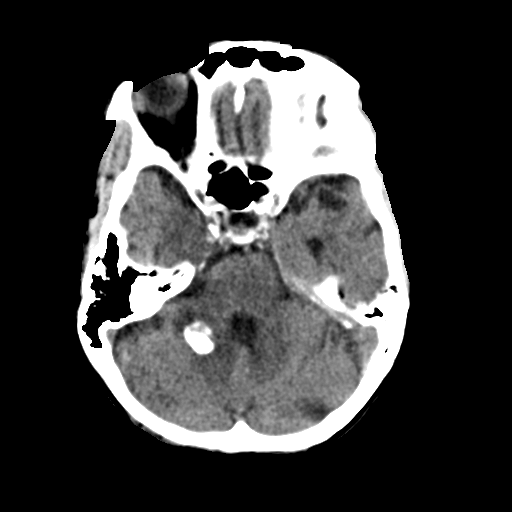
[im 12/31  brain]
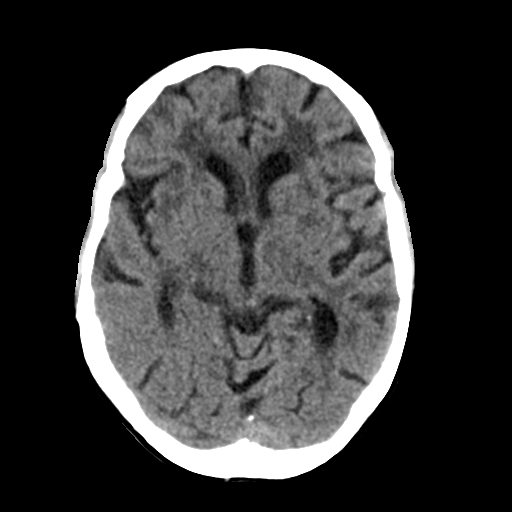
[im 16/31  brain]
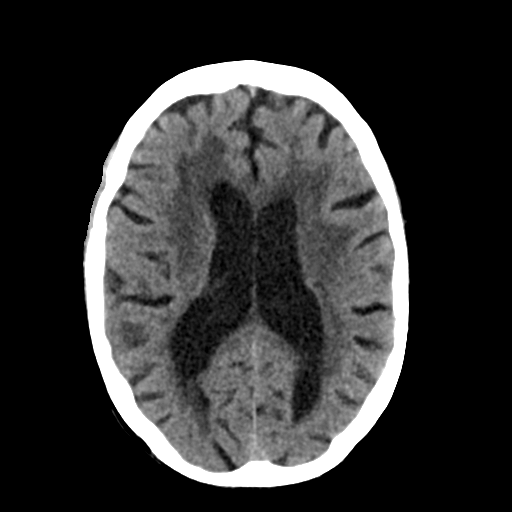
[im 19/31  brain]
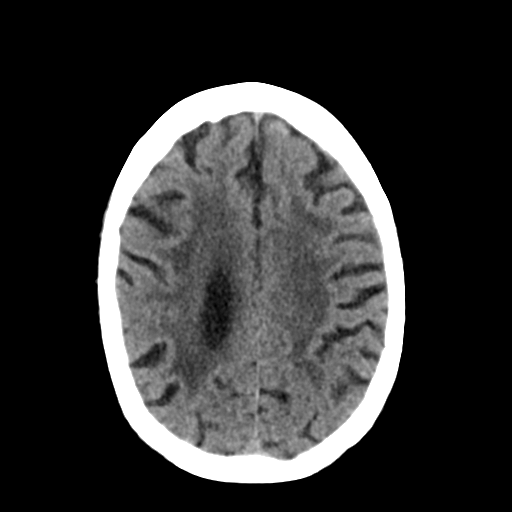
[im 19/31  bone]
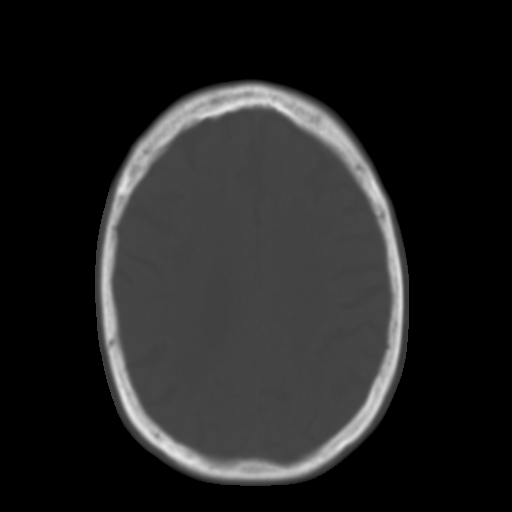
[im 23/31  brain]
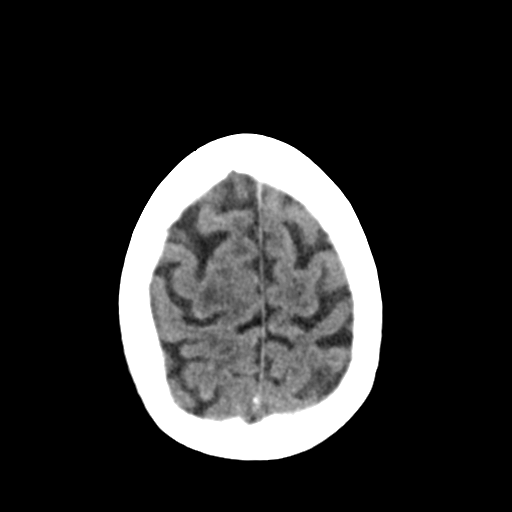
[im 27/31  brain]
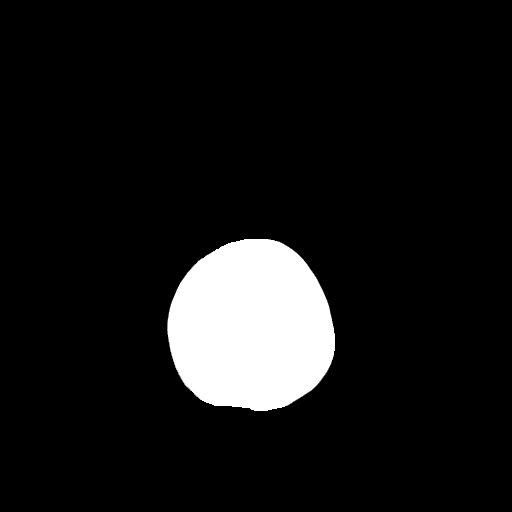

[Series 3: head bone · axial · 0.39mm/px · z∈[-72,-56]mm · 2 of 76 slices shown]
[im 8/76  bone]
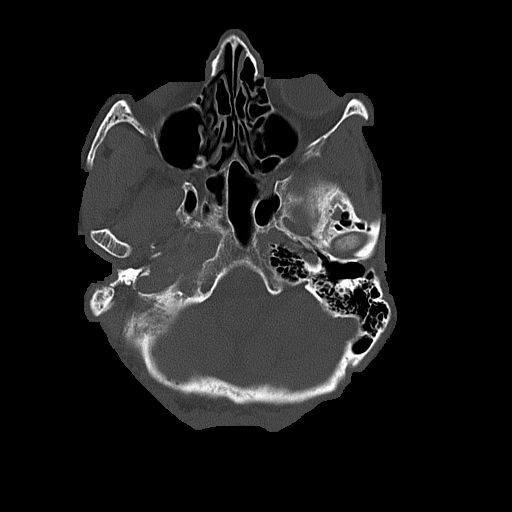
[im 16/76  bone]
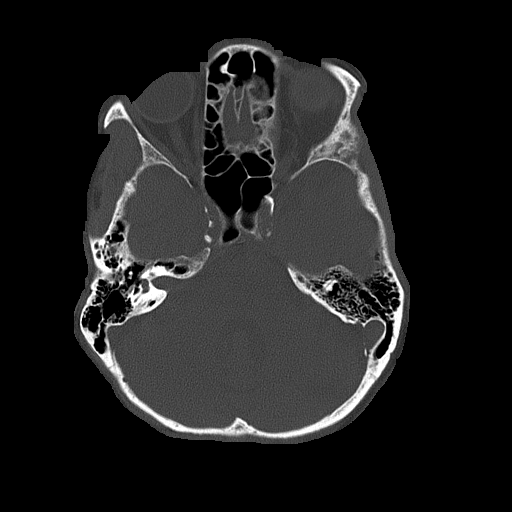

[Series 4: head without cor · coronal · non-contrast · 0.29mm/px · 3 of 66 slices shown]
[im 22/66  brain]
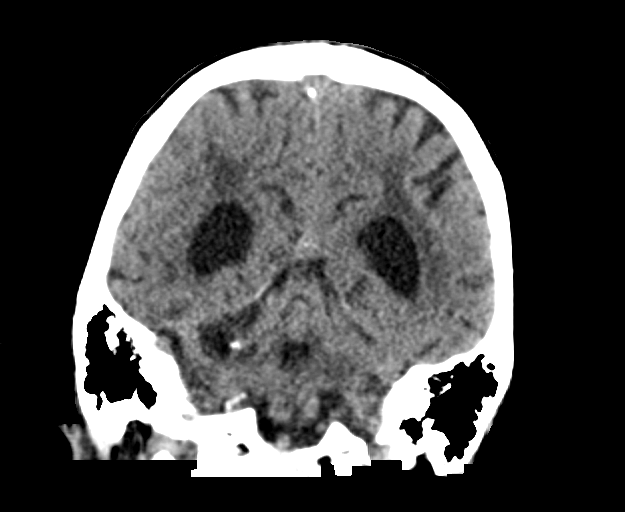
[im 29/66  brain]
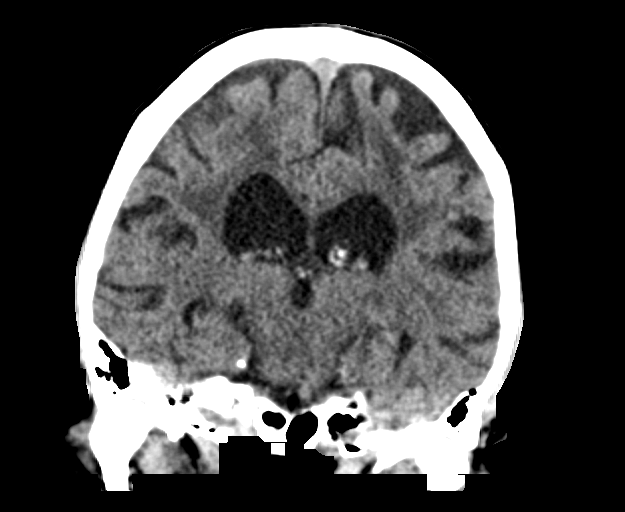
[im 37/66  brain]
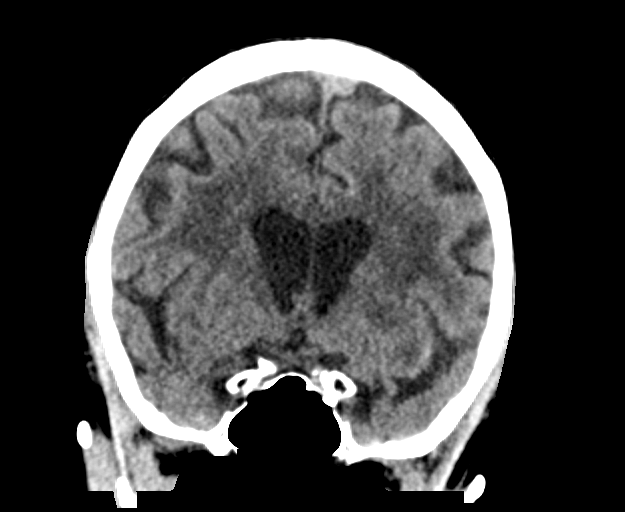

[Series 5: head without sag · sagittal · non-contrast · 0.29mm/px · 3 of 67 slices shown]
[im 23/67  brain]
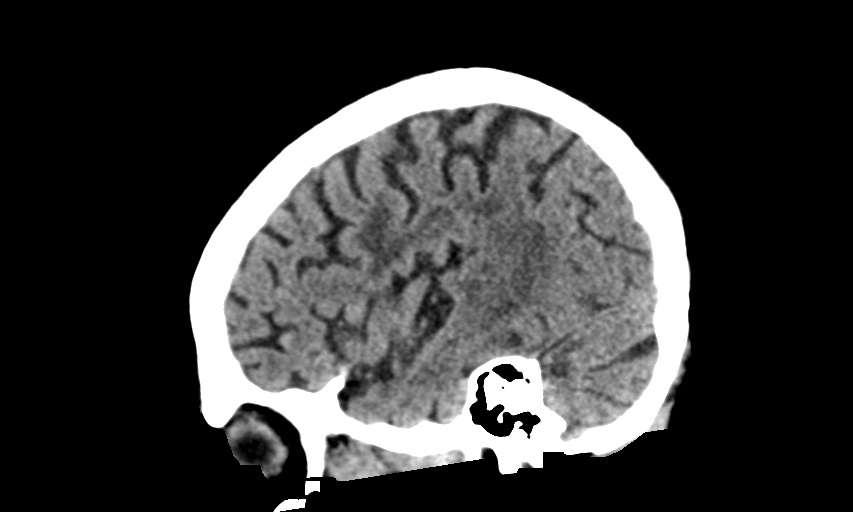
[im 34/67  brain]
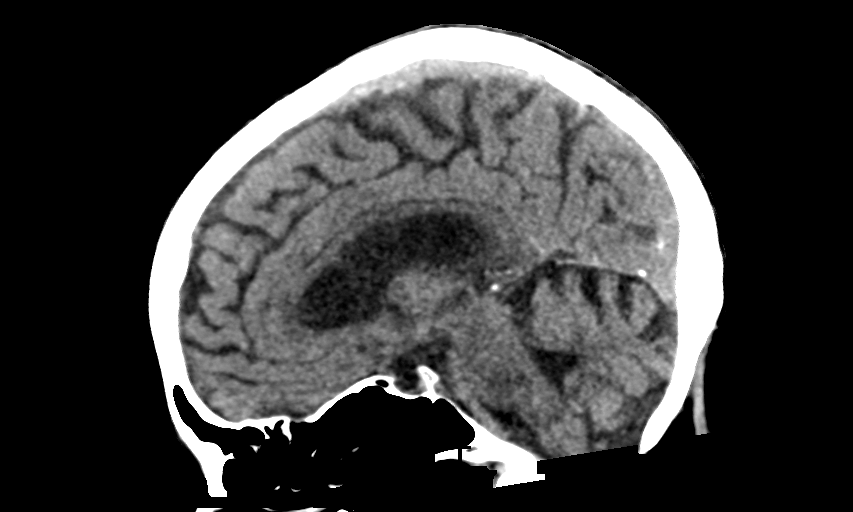
[im 45/67  brain]
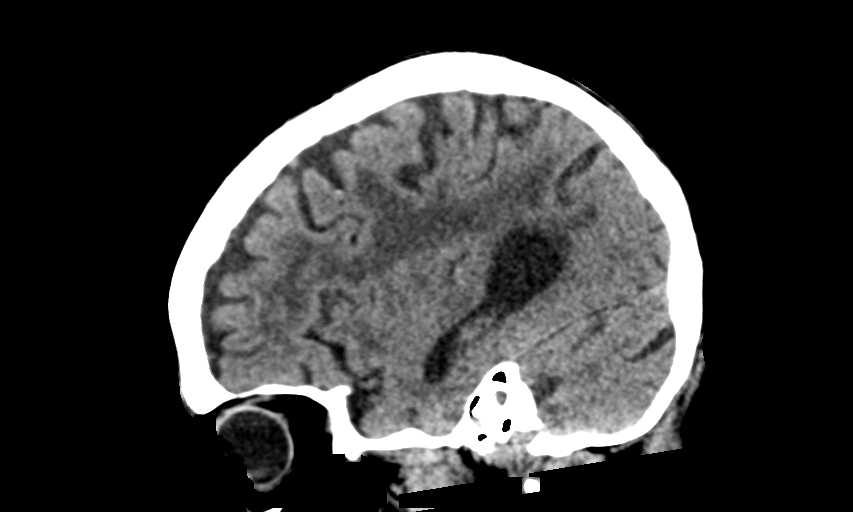

[15 of 47 positions shown; findings below may reference images not displayed]

FINDINGS: Brain: No evidence of acute infarction, hemorrhage, cerebral edema,
mass, mass effect, or midline shift. No hydrocephalus or extra-axial
fluid collection. Periventricular white matter changes, likely the
sequela of chronic small vessel ischemic disease. Redemonstrated
remote infarct in the right superior cerebellum with dystrophic
calcification.

Vascular: No hyperdense vessel. Atherosclerotic calcifications in
the intracranial carotid and vertebral arteries.

Skull: Normal. Negative for fracture or focal lesion.

Sinuses/Orbits: No acute finding.

Other: The mastoid air cells are well aerated.
IMPRESSION: IMPRESSION
No acute intracranial process.

## 2021-11-21 MED ORDER — ACETAMINOPHEN 650 MG RE SUPP: 650.0000 mg | RECTAL | Status: DC | PRN

## 2021-11-21 MED ORDER — RIVAROXABAN 10 MG PO TABS: 10.0000 mg | ORAL_TABLET | Freq: Every day | ORAL | Status: DC

## 2021-11-21 MED ORDER — PANTOPRAZOLE SODIUM 40 MG PO TBEC
40.0000 mg | DELAYED_RELEASE_TABLET | Freq: Every day | ORAL | Status: DC
  Administered 2021-11-22: 40 mg via ORAL
  Filled 2021-11-21: qty 1

## 2021-11-21 MED ORDER — LEVOTHYROXINE SODIUM 100 MCG PO TABS
100.0000 ug | ORAL_TABLET | Freq: Every day | ORAL | Status: DC
  Administered 2021-11-22: 100 ug via ORAL
  Filled 2021-11-21: qty 1

## 2021-11-21 MED ORDER — DOCUSATE SODIUM 100 MG PO CAPS: 100.0000 mg | ORAL_CAPSULE | Freq: Every day | ORAL | Status: DC | PRN

## 2021-11-21 MED ORDER — STROKE: EARLY STAGES OF RECOVERY BOOK
Freq: Once | Status: AC
  Filled 2021-11-21 (×2): qty 1

## 2021-11-21 MED ORDER — METOPROLOL SUCCINATE ER 25 MG PO TB24: 25.0000 mg | ORAL_TABLET | Freq: Every day | ORAL | Status: DC

## 2021-11-21 MED ORDER — SODIUM CHLORIDE 0.9 % IV SOLN: Freq: Once | INTRAVENOUS | Status: AC

## 2021-11-21 MED ORDER — ACETAMINOPHEN 325 MG PO TABS
650.0000 mg | ORAL_TABLET | ORAL | Status: DC | PRN
  Administered 2021-11-21 – 2021-11-22 (×2): 650 mg via ORAL
  Filled 2021-11-21 (×2): qty 2

## 2021-11-21 MED ORDER — ACETAMINOPHEN 160 MG/5ML PO SOLN: 650.0000 mg | ORAL | Status: DC | PRN

## 2021-11-21 NOTE — Assessment & Plan Note (Addendum)
Followed by Dr. Erin Fulling with pulmonology.  Underwent PET scan recently which raised concern for bronchogenic carcinoma with metastases.  CT scan of the head did not show any brain lesions.   ?Based on previous notes it appears that patient is not sure if she wants to proceed with biopsy. ?She has an appointment with Dr. Erin Fulling on 4/4 at 2:30 PM.  Patient and family very keen on keeping this appointment. ?

## 2021-11-21 NOTE — Assessment & Plan Note (Addendum)
Allow permissive hypertension.  Subsequently started back on metoprolol.  Blood pressure is noted to be high.  Discussed with patient and her daughter.  Apparently blood pressures were running low few weeks ago and so her amlodipine was discontinued.  Recommended to patient and her daughter to have blood pressure checked daily and if it remains high it may not be unreasonable to put her back on amlodipine but perhaps at a lower dose. ?

## 2021-11-21 NOTE — Assessment & Plan Note (Addendum)
Patient admits that her short-term memory is impaired.  Will need to be pursued in the outpatient setting. ?

## 2021-11-21 NOTE — H&P (Addendum)
History and Physical    Patient: Monica Murillo QIO:[id number] DOB: 10-10-1932 DOA: 11/21/2021 DOS: the patient was seen and examined on 11/21/2021 PCP: Octavia Heir, NP  Patient coming from: [place] [facility] via EMS  Chief Complaint:  Chief Complaint  Patient presents with   Transient Ischemic Attack   HPI: Monica Murillo is a 88 y.o. female with medical history significant of hypertension, CAD s/p stent, paroxysmal atrial fibrillation, complete heart block s/p PM, diabetes mellitus type 2, chronic respiratory failure with hypoxia on 2 L, pulmonary fibrosis, left-sided breast cancer, former smoker, and hypothyroidism presents after being unable to speak briefly this morning.  She had woken up in her normal state of health and normally ambulates with the use of a walker or wheelchair.  She had had breakfast this morning and took all of her medications as prescribed.  Around 11 AM she had called her daughter, but for 30 seconds to a minute was unable to speak.  Thereafter she was able to talk and speech was slurred only temporarily before going back to normal.  Denies having any headache, fever, focal weakness, palpitations, chest pain, nausea, vomiting, or diarrhea.  She had followed up with Dr. Elberta Fortis on 3/6 to establish care as she recently moved here from [place].  Patient had just recently had PET scan performed on 3/21 which noted concern for right lobe mass consistent with a bronchogenic carcinoma with hypermetabolic subcarinal lymph node consistent with mediastinal nodal and skeletal metastases appreciated.    Upon admission into the emergency department patient was seen to be afebrile with pulse 58-63, blood pressures elevated up to 188/71, and all other vital signs maintained.  Labs noted hemoglobin 10.8, INR 2.1, and TSH 0.062.  CT scan of the head did not note any acute abnormality.  She is not a thrombolytic candidate as symptoms quickly resolved she is on  anticoagulation.   Review of Systems: As mentioned in the history of present illness. All other systems reviewed and are negative. Past Medical History:  Diagnosis Date   Anemia    Heart block    History of blood transfusion    History of heart artery stent    Hypertension    Hypothyroid    Pacemaker    Pulmonary fibrosis (HCC)    Respiratory failure, chronic (HCC)    Type 2 diabetes mellitus (HCC)    Past Surgical History:  Procedure Laterality Date   ABDOMINAL HYSTERECTOMY  1981   BREAST LUMPECTOMY  2011   CARDIAC PACEMAKER PLACEMENT  2018   CORONARY STENT PLACEMENT  2012   HIP FRACTURE SURGERY Right 08/20/2021   Per patients daughter   INTRAMEDULLARY (IM) NAIL INTERTROCHANTERIC Right 08/20/2021   Procedure: INTRAMEDULLARY (IM) NAIL INTERTROCHANTRIC;  Surgeon: Ernestina Columbia, MD;  Location: [facility];  Service: Orthopedics;  Laterality: Right;   Social History:  reports that she has quit smoking. Her smoking use included cigarettes. She has a 20.00 pack-year smoking history. She has never been exposed to tobacco smoke. She has never used smokeless tobacco. She reports current alcohol use. She reports that she does not use drugs.  Allergies  Allergen Reactions   Lidocaine-Epinephrine Anxiety    Dizzy, disoriented, "cannot receive at the dentist"   Sulfa Antibiotics Nausea And Vomiting and Rash    Sulfa and related      Family History  Problem Relation Age of Onset   Other Mother        Enlarged Heart   Dementia Sister  Prior to Admission medications   Medication Sig Start Date End Date Taking? Authorizing Provider  Acetaminophen (TYLENOL 8 HOUR ARTHRITIS PAIN PO) Take 600 mg by mouth in the morning, at noon, in the evening, and at bedtime.    [provider]  docusate sodium (COLACE) 100 MG capsule Take 100 mg by mouth daily.    [provider]  Esomeprazole Magnesium (NEXIUM PO) Take 1 capsule by mouth in the morning.    [provider]   levothyroxine (SYNTHROID) 125 MCG tablet Take 1 tablet (125 mcg total) by mouth daily before breakfast. Wants the Name Brand 10/21/21 07/18/22  Medina-Vargas, Monina C, NP  Lutein-Zeaxanthin 20-1 MG CAPS Take 1 capsule by mouth in the morning.    [provider]  METOPROLOL SUCCINATE PO Take 25 mg by mouth daily.    [provider]  OXYGEN Inhale into the lungs. 2 Liters    [provider]  rivaroxaban (XARELTO) 10 MG TABS tablet Take 10 mg by mouth daily.    [provider]    Physical Exam: Vitals:   11/21/21 1119 11/21/21 1130 11/21/21 1145 11/21/21 1314  BP: (!) 172/78 (!) 162/82 (!) 157/83 (!) 188/71  Pulse: 60 61 (!) 58 63  Resp: 14 18 14 18   Temp: 97.8 F (36.6 C)     TempSrc: Oral     SpO2: 100% 100% 100% 100%  Weight: 50.5 kg     Height: 5' (1.524 m)      Constitutional: Elderly female currently in no acute distress NAD, calm, comfortable Eyes: PERRL, lids and conjunctivae normal ENMT: Mucous membranes are moist. Posterior pharynx clear of any exudate or lesions.  Neck: normal, supple, no masses, no thyromegaly Respiratory: Normal respiratory effort with O2 saturations maintained on 2 L nasal cannula oxygen. Cardiovascular: Regular rate and rhythm.  Pacemaker present of the upper right chest wall. Abdomen: no tenderness, no masses palpated. Bowel sounds positive.  Musculoskeletal: no clubbing / cyanosis. No joint deformity upper and lower extremities.   Skin: no rashes, lesions, ulcers. No induration Neurologic: CN 2-12 grossly intact.  Strength 5/5 in all 4.  Speech is clear and fluent Psychiatric: Mild memory impairment.  Alert and oriented x 3. Normal mood.   Data Reviewed:  Sinus rhythm at 60 bpm with first-degree heart block.  Reviewed labs, imaging, and pertinent records as noted above in HPI  Assessment and Plan: Aphasia secondary to suspected TIA (transient ischemic attack) Patient presents with episode of transient aphasia  where she was unable to speak for approximately a minute by patient report and a couple minutes by her daughter.  Initial CT scan of the brain did not show any signs of stroke.  Question if symptoms secondary to a TIA/stroke versus seizure given lung mass with concern for mets. -Admit to a telemetry bed -Neurochecks -Follow-up MRIs of brain with and without contrast -Check EEG -Check echocardiogram -Allowing for permissive hypertension -PT/OT/speech to eval and treat -Appreciate neurology consultative services,  will follow-up for any further recommendation  Lung mass Daughter notes that the patient has been being followed by pulmonology Dr. Francine Graven and recently underwent PET scan which showed concern for bronchogenic carcinoma with subcarinal lymph node and skeletal masses appreciated.  Plan is to follow-up with pulmonology this week for possible discussions on biopsy.  Given that he of treatment would just be palliative in nature and the patient made note of possibly not wanting to undergo any biopsy. -May want to discuss consulting palliative care  Normocytic anemia Acute.  Patient's hemoglobin appears to have been slowly trending down from normal in 07/2021-> 11.6 on 08/22/2021-> 10.8 on admission today.  She did not note any signs of bleeding. -Check stool guaiac -Type and screen in a.m. -Recheck CBC in a.m.  Hypothyroidism Home medication regimen includes levothyroxine 125 mcg daily.  TSH was 0.062. -Would recommend decreasing levothyroxine to 100 mcg daily -Repeat TSH in 4 to 6 weeks  PAF (paroxysmal atrial fibrillation) on chronic anticoagulation (HCC) Complete heart s/p pacemaker Patient appears to be in sinus rhythm at this time.  She has a history of paroxysmal atrial fibrillation and complete heart block status post pacemaker.  She took her Xarelto this morning with her breakfast.  She had just establish care with Dr. Elberta Fortis earlier this month. -Continue Xarelto -Resume  metoprolol when medically appropriate  Pulmonary fibrosis chronic respiratory failure with hypoxia -Continue nasal cannula oxygen to maintain O2 saturations -Breathing treatments as needed  Essential hypertension Home blood pressure regimen includes metoprolol 25 mg daily.  Patient had taken her home blood pressure medications this morning. -Allow for permissive hypertension until able to rule out stroke  Memory impairment Patient admits that her short-term memory is impaired some and her also makes note of this. -Delirium precautions -Bed alarm on     Advance Care Planning:   Code Status: DNR   Consults:  neurology  Family Communication: Daughter updated over the phone right lobe mass consistent with bronchogenic carcinoma with skeletal  Severity of Illness: The appropriate patient status for this patient is OBSERVATION. Observation status is judged to be reasonable and necessary in order to provide the required intensity of service to ensure the patient's safety. The patient's presenting symptoms, physical exam findings, and initial radiographic and laboratory data in the context of their medical condition is felt to place them at decreased risk for further clinical deterioration. Furthermore, it is anticipated that the patient will be medically stable for discharge from the hospital within 2 midnights of admission.   Author: Clydie Braun, MD 11/21/2021 1:30 PM  For on call review [email or web address].

## 2021-11-21 NOTE — ED Provider Notes (Signed)
?[facility] ?Provider Note ? ? ?CSN: [id number] ?Arrival date & time: 11/21/21  1109 ? ?  ? ?History ? ?Chief Complaint  ?Patient presents with  ? Transient Ischemic Attack  ? ? ?Monica Murillo is a 88 y.o. female. ? ?HPI ?Patient presents for transient strokelike symptoms.  Medical history includes HTN, paroxysmal atrial fibrillation (on Xarelto with last dose this morning), heart block (S/P pacemaker), pulmonary fibrosis, T2DM, hypothyroidism, GERD, memory impairment.  She denies any history of stroke or TIA.  This morning she reports that she was in her normal state of health.  At approximately 10 AM, she was on the phone with her daughter.  She experienced the sudden onset of inability to speak or verbalize sound.  At this time, she also felt some mild numbness in her right arm.  Symptoms lasted for only a few minutes and have since had complete resolution.  Patient's daughter and granddaughter realized what was happening and instructed the patient to call EMS.  Currently, patient has no physical complaints. ?  ? ?Home Medications ?Prior to Admission medications   ?Medication Sig Start Date End Date Taking? Authorizing Provider  ?Acetaminophen (TYLENOL 8 HOUR ARTHRITIS PAIN PO) Take 600 mg by mouth in the morning, at noon, in the evening, and at bedtime.   Yes [provider]  ?esomeprazole (NEXIUM) 20 MG capsule Take 1 capsule by mouth in the morning.   Yes [provider]  ?levothyroxine (SYNTHROID) 125 MCG tablet Take 1 tablet (125 mcg total) by mouth daily before breakfast. Wants the Name Brand 10/21/21 07/18/22 Yes Medina-Vargas, Monina C, NP  ?Lutein-Zeaxanthin 20-1 MG CAPS Take 1 capsule by mouth in the morning.   Yes [provider]  ?METOPROLOL SUCCINATE PO Take 25 mg by mouth daily.   Yes [provider]  ?rivaroxaban (XARELTO) 10 MG TABS tablet Take 10 mg by mouth daily.   Yes [provider]  ?docusate sodium (COLACE)  100 MG capsule Take 100 mg by mouth daily as needed for mild constipation.    [provider]  ?OXYGEN Inhale into the lungs. 2 Liters    [provider]  ?   ? ?Allergies    ?Lidocaine-epinephrine and Sulfa antibiotics   ? ?Review of Systems   ?Review of Systems  ?Neurological:  Positive for speech difficulty and numbness.  ?All other systems reviewed and are negative. ? ?Physical Exam ?Updated Vital Signs ?BP (!) 177/68   Pulse 61   Temp (!) 97.5 ?F (36.4 ?C)   Resp 20   Ht 5' (1.524 m)   Wt 50.5 kg   SpO2 95%   BMI 21.74 kg/m?  ?Physical Exam ?Vitals and nursing note reviewed.  ?Constitutional:   ?   General: She is not in acute distress. ?   Appearance: Normal appearance. She is well-developed and normal weight. She is not ill-appearing, toxic-appearing or diaphoretic.  ?HENT:  ?   Head: Normocephalic and atraumatic.  ?   Right Ear: External ear normal.  ?   Left Ear: External ear normal.  ?   Nose: Nose normal.  ?   Mouth/Throat:  ?   Mouth: Mucous membranes are moist.  ?   Pharynx: Oropharynx is clear.  ?Eyes:  ?   Extraocular Movements: Extraocular movements intact.  ?   Conjunctiva/sclera: Conjunctivae normal.  ?Cardiovascular:  ?   Rate and Rhythm: Normal rate and regular rhythm.  ?   Heart sounds: No murmur heard. ?Pulmonary:  ?  Effort: Pulmonary effort is normal. No respiratory distress.  ?   Breath sounds: Normal breath sounds.  ?Abdominal:  ?   Palpations: Abdomen is soft.  ?   Tenderness: There is no abdominal tenderness.  ?Musculoskeletal:     ?   General: No swelling. Normal range of motion.  ?   Cervical back: Normal range of motion and neck supple. No rigidity.  ?   Right lower leg: No edema.  ?   Left lower leg: No edema.  ?Skin: ?   General: Skin is warm and dry.  ?   Capillary Refill: Capillary refill takes less than 2 seconds.  ?   Coloration: Skin is not jaundiced or pale.  ?Neurological:  ?   General: No focal deficit present.  ?   Mental Status: She is alert and  oriented to person, place, and time.  ?   Cranial Nerves: No cranial nerve deficit.  ?   Sensory: No sensory deficit.  ?   Motor: No weakness.  ?   Coordination: Coordination normal.  ?Psychiatric:     ?   Mood and Affect: Mood normal.     ?   Behavior: Behavior normal.     ?   Thought Content: Thought content normal.     ?   Judgment: Judgment normal.  ? ? ?ED Results / Procedures / Treatments   ?Labs ?(all labs ordered are listed, but only abnormal results are displayed) ?Labs Reviewed  ?COMPREHENSIVE METABOLIC PANEL - Abnormal; Notable for the following components:  ?    Result Value  ? Glucose, Bld 115 (*)   ? Total Protein 6.1 (*)   ? All other components within normal limits  ?PROTIME-INR - Abnormal; Notable for the following components:  ? Prothrombin Time 23.3 (*)   ? INR 2.1 (*)   ? All other components within normal limits  ?CBC WITH DIFFERENTIAL/PLATELET - Abnormal; Notable for the following components:  ? RBC 3.71 (*)   ? Hemoglobin 10.8 (*)   ? HCT 34.2 (*)   ? Eosinophils Absolute 0.8 (*)   ? All other components within normal limits  ?TSH - Abnormal; Notable for the following components:  ? TSH 0.062 (*)   ? All other components within normal limits  ?CBG MONITORING, ED - Abnormal; Notable for the following components:  ? Glucose-Capillary 107 (*)   ? All other components within normal limits  ?URINALYSIS, ROUTINE W REFLEX MICROSCOPIC  ?MAGNESIUM  ?HEMOGLOBIN A1C  ?LIPID PANEL  ?CBC  ?OCCULT BLOOD X 1 CARD TO LAB, STOOL  ?BASIC METABOLIC PANEL  ?TYPE AND SCREEN  ? ? ?EKG ?EKG Interpretation ? ?Date/Time:  Sunday November 21 2021 11:18:46 EDT ?Ventricular Rate:  60 ?PR Interval:  246 ?QRS Duration: 94 ?QT Interval:  452 ?QTC Calculation: 452 ?R Axis:   -38 ?Text Interpretation: Sinus rhythm Prolonged PR interval LVH with secondary repolarization abnormality Anterior Q waves, possibly due to LVH Confirmed by Godfrey Pick [phone]) on 11/21/2021 12:23:20 PM ? ?Radiology ?CT HEAD WO CONTRAST ? ?Result Date:  11/21/2021 ?CLINICAL DATA:  Transient ischemic attack EXAM: CT HEAD WITHOUT CONTRAST TECHNIQUE: Contiguous axial images were obtained from the base of the skull through the vertex without intravenous contrast. RADIATION DOSE REDUCTION: This exam was performed according to the departmental dose-optimization program which includes automated exposure control, adjustment of the mA and/or kV according to patient size and/or use of iterative reconstruction technique. COMPARISON:  08/18/2021 FINDINGS: Brain: No evidence of acute infarction, hemorrhage, cerebral edema,  mass, mass effect, or midline shift. No hydrocephalus or extra-axial fluid collection. Periventricular white matter changes, likely the sequela of chronic small vessel ischemic disease. 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 Vascular: No hyperdense vessel. Atherosclerotic calcifications in the intracranial carotid and vertebral arteries. Skull: Normal. Negative for fracture or focal lesion. Sinuses/Orbits: No acute finding. Other: The mastoid air cells are well aerated. IMPRESSION: IMPRESSION No acute intracranial process. Electronically Signed   By: Merilyn Baba M.D.   On: 11/21/2021 12:28   ? ?Procedures ?Procedures  ? ? ?Medications Ordered in ED ?Medications  ?acetaminophen (TYLENOL) tablet 650 mg (has no administration in time range)  ?  Or  ?acetaminophen (TYLENOL) 160 MG/5ML solution 650 mg (has no administration in time range)  ?  Or  ?acetaminophen (TYLENOL) suppository 650 mg (has no administration in time range)  ?levothyroxine (SYNTHROID) tablet 100 mcg (has no administration in time range)  ?docusate sodium (COLACE) capsule 100 mg (has no administration in time range)  ?pantoprazole (PROTONIX) EC tablet 40 mg (has no administration in time range)  ?rivaroxaban (XARELTO) tablet 10 mg (has no administration in time range)  ? stroke: mapping our early stages of recovery book ( Does not apply  Given 11/21/21 1419)  ?0.9 %  sodium chloride infusion ( Intravenous New Bag/Given 11/21/21 1419)  ? ? ?ED Course/ Medical Decision Making/ A&P ?  ?                        ?Medical Decision Making ?Amount and/or Complexity o

## 2021-11-21 NOTE — ED Notes (Signed)
Pt transported to CT ?

## 2021-11-21 NOTE — ED Notes (Signed)
Pt appropriately used call light. Pt ambulated to wheelchair w/ assistance, pt provided w/ warm blankets and placed back on monitoring devices. No other needs expressed at this time, lights set to pt preference, call light within reach, personal belongings within reach. Pt resting comfortably in bed at this time. ?

## 2021-11-21 NOTE — Consult Note (Addendum)
Neurology Attending Attestation ?  ?I examined the patient and discussed plan with Dr. Corky Sox resident. Note below reflects my findings and recommendations with the following additions/exceptions: 88 year old woman with a past medical history significant for CAD status post PCI and stents, pacemaker for complete heart block, paroxysmal A-fib on Xarelto, ILD on 2 L of home oxygen who presented to the emergency department today after a brief episode of global aphasia associated with right arm heaviness.  Her symptoms have completely resolved and her stroke scale is currently 0.  Her symptoms are highly concerning for TIA given that her speech disturbance and right arm heaviness would both localize to the L MCA distribution. She has findings c/f bronchogenic carcinoma on PET scan 11/10/21 and is therefore hypercoagulable in the setting of known malignancy, so will not hold xarelto pending MRI.  ? ?Recommendations ?- Permissive HTN x48 hrs from sx onset or until stroke ruled out by MRI goal BP <220/110. PRN labetalol or hydralazine if BP above these parameters. Avoid oral antihypertensives. ?- MRI brain with and without contrast in the setting of malignancy ?- MRA H&N ?- No intracardiac clot on TTE 10/22/21, no indication to repeat ?- Check A1c and LDL + add statin per guidelines ?- Continue home xarelto ?- q4 hr neuro checks ?- STAT head CT for any change in neuro exam ?- Tele ?- PT/OT/SLP ?- Stroke education ?- Routine EEG ? ?Stroke team will continue to follow. ? ? ?  ?Su Monks, MD ?[facility] ?[phone] ?  ?If 7pm- 7am, please page neurology on call as listed in [place]. ? ? ?NEUROLOGY CONSULTATION NOTE  ? ?Date of service: November 21, 2021 ?Patient Name: Monica Murillo ?MRN:  [id number] ?DOB:  08-15-1933 ?Reason for consult: "suspected TIA" ?Requesting Provider: Norval Morton, MD ? ?History of Present Illness  ?Monica Murillo is a 88 y.o. female with a past medical history of CAD s/p PCI  and stents, pacemaker for complete heart block, paroxysmal A-fib on Xarelto, ILD on 2 L of O2 at home.  She presents to the emergency department today for suspected transient ischemic attack. ? ?The patient reports that she was in her normal state of health this morning.  She was talking with her daughter on the phone and all of a sudden "I couldn't make a sound" for about 1 minute.  After this period of mutism, she then produced garbled speech, but then quickly was able to speak normally.  The patient's daughter insisted that she call 911 to be evaluated in the emergency department.  The patient reports that around the same time her right arm also "felt weird".  She denies that the sensation is similar to numbness or tingling, and cannot elaborate.  On assessment this afternoon, the patient speech is fluent and she is fully alert and oriented.  ?  ?ROS  ? ?Constitutional Denies weight loss, fever and chills.   ?HEENT Denies changes in vision and hearing.   ?Respiratory Denies SOB and cough.   ?CV Denies palpitations and CP   ?GI Denies abdominal pain, nausea, vomiting and diarrhea.   ?GU Denies dysuria and urinary frequency.   ?MSK Denies myalgia and joint pain.   ?Skin Denies rash and pruritus.   ?Neurological Denies headache and syncope.   ?Psychiatric Denies recent changes in mood. Denies anxiety and depression.   ? ?Past History  ? ?Past Medical History:  ?Diagnosis Date  ? Anemia   ? Heart block   ? History of blood transfusion   ?  History of heart artery stent   ? Hypertension   ? Hypothyroid   ? Pacemaker   ? Pulmonary fibrosis ([place])   ? Respiratory failure, chronic ([place])   ? Type 2 diabetes mellitus ([place])   ? ?Past Surgical History:  ?Procedure Laterality Date  ? ABDOMINAL HYSTERECTOMY  1981  ? BREAST LUMPECTOMY  2011  ? CARDIAC PACEMAKER PLACEMENT  2018  ? CORONARY STENT PLACEMENT  2012  ? HIP FRACTURE SURGERY Right 08/20/2021  ? Per patients daughter  ? INTRAMEDULLARY (IM) NAIL INTERTROCHANTERIC Right  08/20/2021  ? Procedure: INTRAMEDULLARY (IM) NAIL INTERTROCHANTRIC;  Surgeon: [name], MD;  Location: [facility];  Service: Orthopedics;  Laterality: Right;  ? ?Family History  ?Problem Relation Age of Onset  ? Other Mother   ?     Enlarged Heart  ? Dementia Sister   ? ?Social History  ? ?Socioeconomic History  ? Marital status: Widowed  ?  Spouse name: Not on file  ? Number of children: Not on file  ? Years of education: Not on file  ? Highest education level: Not on file  ?Occupational History  ? Not on file  ?Tobacco Use  ? Smoking status: Former  ?  Packs/day: 1.00  ?  Years: 20.00  ?  Pack years: 20.00  ?  Types: Cigarettes  ?  Passive exposure: Never  ? Smokeless tobacco: Never  ?Vaping Use  ? Vaping Use: Never used  ?Substance and Sexual Activity  ? Alcohol use: Yes  ?  Comment: 2-3 times a week  ? Drug use: Never  ? Sexual activity: Not on file  ?Other Topics Concern  ? Not on file  ?Social History Narrative  ? Diet:  ?   ? Caffeine:Coffee  ?   ? Married, if yes what year: Widow, 1959  ?   ? Do you live in a house, apartment, assisted living, condo, trailer, ect: Senior Living Apartment  ?   ? Is it one or more stories:   ?   ? How many persons live in your home? 1  ?   ? Pets: 2 Cats  ?   ? Highest level or education completed: Nursing bachelors  ?   ? Current/Past profession:  ?   ? Exercise: No                 Type and how often: Walking  ?   ?   ? Living Will: Yes  ? DNR: No  ? POA/HPOA: Yes  ?   ? Functional Status:  ? Do you have difficulty bathing or dressing yourself? Yes, sometimes dressing  ? Do you have difficulty preparing food or eating? No   ? Do you have difficulty managing your medications? No   ? Do you have difficulty managing your finances? Yes  ? Do you have difficulty affording your medications? No   ? ?Social Determinants of Health  ? ?Financial Resource Strain: Not on file  ?Food Insecurity: Not on file  ?Transportation Needs: Not on file  ?Physical Activity: Not on file  ?Stress:  Not on file  ?Social Connections: Not on file  ? ?Allergies  ?Allergen Reactions  ? Lidocaine-Epinephrine Anxiety  ?  Dizzy, disoriented, "cannot receive at the dentist"  ? Sulfa Antibiotics Nausea And Vomiting and Rash  ?  Sulfa and related  ?  ? ? ?Medications  ?(Not in a hospital admission) ?  ? ?Vitals  ? ?Vitals:  ? 11/21/21 1145 11/21/21 1314 11/21/21 1345  11/21/21 1500  ?BP: (!) 157/83 (!) 188/71 (!) 174/84 (!) 183/86  ?Pulse: (!) 58 63 67 66  ?Resp: 14 18 16 20   ?Temp:      ?TempSrc:      ?SpO2: 100% 100% 100% 100%  ?Weight:      ?Height:      ?  ? ?Body mass index is 21.74 kg/m?. ? ?Physical Exam  ?General: Lying comfortably in bed; in no acute distress.  ?HENT: Normal oropharynx and mucosa. Normal external appearance of ears and nose.  ?Neck: Supple, no pain or tenderness  ?CV: No peripheral edema.  ?Pulmonary: Symmetric Chest rise. Normal respiratory effort.  ?Ext: No cyanosis, edema, or deformity  ?Skin: No rash. Normal palpation of skin.   ?  ?  ?Neurologic Examination  ?Mental status/Cognition: Alert, oriented to self, place, month and year, good attention.  ?Speech/language: Fluent, comprehension intact. ?Cranial nerves:  ? CN II Pupils equal and reactive to light, no VF deficits   ? CN III,IV,VI EOM intact, no gaze preference or deviation, no nystagmus   ? CN V normal sensation in V1, V2, and V3 segments bilaterally   ? CN VII no asymmetry, no nasolabial fold flattening   ? CN VIII normal hearing to speech   ? CN IX & X normal palatal elevation, no uvular deviation   ? CN XI 5/5 head turn and 5/5 shoulder shrug bilaterally   ? CN XII midline tongue protrusion   ?  ?Motor:  ?Mvmt Root Nerve  Muscle Right Left Comments  ?SA C5/6 Ax Deltoid 5/5 5/5    ?EF C5/6 Mc Biceps 5/5 5/5    ?EE C6/7/8 Rad Triceps 5/5 5/5    ?WF C6/7 Med FCR        ?WE C7/8 PIN ECU        ?F Ab C8/T1 U ADM/FDI        ?HF L1/2/3 Fem Illopsoas 5/5 5/5    ?KE L2/3/4 Fem Quad  5/5 5/5    ?DF L4/5 D Peron Tib Ant        ?PF S1/2 Tibial  Grc/Sol        ?  ?Reflexes: ?  Right Left Comments  ?Pectoralis        ? Biceps (C5/6) 1+ 1+    ?Brachioradialis (C5/6)      ? Triceps (C6/7)      ? Patellar (L3/4) 1+ 1+    ? Achilles (S1)      ? Hoffm

## 2021-11-21 NOTE — Assessment & Plan Note (Addendum)
Home medication regimen includes levothyroxine 125 mcg daily.  TSH was 0.062.  Levothyroxine dose was decreased to 112 mcg daily. ?Repeat TSH in 4 to 6 weeks ?

## 2021-11-21 NOTE — ED Triage Notes (Signed)
Pt to ED via EMS from [facility]. Pt states she was on the phone with her daughter at [place] and became unable to speak. Pt states she knew what she wanted to say but could not speak. Symptoms resolved prior to EMS arrival. Hx left sided breast cancer with lymph node removal and right sided hip surgery. Patient currently on Xarelto. Alert and oriented x 4 on arrival to ED. ? ?EMS v/s: ?146/68 ?62 HR ?20 RR ?98% 2L [place] baseline ?188 CBG ? ?20g RFA placed by EMS ?

## 2021-11-21 NOTE — ED Notes (Signed)
We have a diet order will give her a sandwich ?

## 2021-11-21 NOTE — Assessment & Plan Note (Addendum)
Patient presented with transient aphasia.  She did not have any hemiplegia.  Symptoms resolved within a few minutes. ?Concern is for TIA.   ?Due to presence of pacemaker MRI was delayed.  After it was verified that the pacemaker was MRI conditional patient underwent imaging studies which did not show acute stroke.  Some vascular disease was noted.  Area of encephalomalacia was noted which need follow-up MRI in 2 to 3 months. ?Discussed with neurology who recommends continuing Xarelto.  Statin has been added. ?Outpatient follow-up with neurology. ?Patient seen by PT and OT, SLP.  Home health has been ordered.  All of the above discussed with patient's daughter.   ?LDL noted to be 76.  HbA1c 6.3. ?EEG was unremarkable for epileptiform activity. ?

## 2021-11-21 NOTE — ED Notes (Signed)
The pt is hungry  she is waiting for mri  she has passed her swallow screen ?

## 2021-11-21 NOTE — Assessment & Plan Note (Addendum)
It appears the patient's hemoglobin has been slowly trending downwards.  Stable this morning.  No overt bleeding.  She is on anticoagulation.   ?Will need close monitoring in the outpatient setting. ?

## 2021-11-21 NOTE — Assessment & Plan Note (Addendum)
Continue metoprolol and Xarelto. ?

## 2021-11-22 ENCOUNTER — Observation Stay (HOSPITAL_COMMUNITY): Payer: Medicare Other

## 2021-11-22 ENCOUNTER — Observation Stay (HOSPITAL_BASED_OUTPATIENT_CLINIC_OR_DEPARTMENT_OTHER): Payer: Medicare Other

## 2021-11-22 DIAGNOSIS — R4701 Aphasia: Secondary | ICD-10-CM | POA: Diagnosis not present

## 2021-11-22 DIAGNOSIS — R4789 Other speech disturbances: Secondary | ICD-10-CM | POA: Diagnosis not present

## 2021-11-22 DIAGNOSIS — G459 Transient cerebral ischemic attack, unspecified: Secondary | ICD-10-CM

## 2021-11-22 LAB — CBC
HCT: 32.9 % — ABNORMAL LOW (ref 36.0–46.0)
Hemoglobin: 10.6 g/dL — ABNORMAL LOW (ref 12.0–15.0)
MCH: 29.1 pg (ref 26.0–34.0)
MCHC: 32.2 g/dL (ref 30.0–36.0)
MCV: 90.4 fL (ref 80.0–100.0)
Platelets: 291 10*3/uL (ref 150–400)
RBC: 3.64 MIL/uL — ABNORMAL LOW (ref 3.87–5.11)
RDW: 12.7 % (ref 11.5–15.5)
WBC: 11 10*3/uL — ABNORMAL HIGH (ref 4.0–10.5)
nRBC: 0 % (ref 0.0–0.2)

## 2021-11-22 LAB — BASIC METABOLIC PANEL
Anion gap: 7 (ref 5–15)
BUN: 9 mg/dL (ref 8–23)
CO2: 23 mmol/L (ref 22–32)
Calcium: 9 mg/dL (ref 8.9–10.3)
Chloride: 107 mmol/L (ref 98–111)
Creatinine, Ser: 0.56 mg/dL (ref 0.44–1.00)
GFR, Estimated: >60 mL/min (ref >60)
Glucose, Bld: 113 mg/dL — ABNORMAL HIGH (ref 70–99)
Potassium: 3.8 mmol/L (ref 3.5–5.1)
Sodium: 137 mmol/L (ref 135–145)

## 2021-11-22 LAB — LIPID PANEL
Cholesterol: 168 mg/dL (ref 0–200)
HDL: 46 mg/dL (ref >40)
LDL Cholesterol: 76 mg/dL (ref 0–99)
Total CHOL/HDL Ratio: 3.7 RATIO
Triglycerides: 228 mg/dL — ABNORMAL HIGH (ref <150)
VLDL: 46 mg/dL — ABNORMAL HIGH (ref 0–40)

## 2021-11-22 LAB — ECHOCARDIOGRAM LIMITED
Area-P 1/2: 3.72 cm2
P 1/2 time: 549 msec

## 2021-11-22 LAB — GLUCOSE, CAPILLARY: Glucose-Capillary: 100 mg/dL — ABNORMAL HIGH (ref 70–99)

## 2021-11-22 LAB — TYPE AND SCREEN
ABO/RH(D): O NEG
Antibody Screen: NEGATIVE

## 2021-11-22 IMAGING — MR MR HEAD WO/W CM
8 of 14 series · 30 of 48 positions shown · IV contrast (Yes   MULTIHANCE)
Comparison: Noncontrast CT head dated 1 day prior, [DATE]

CLINICAL DATA: Stroke/TIA

EXAM:
MRI HEAD WITHOUT AND WITH CONTRAST
MRA HEAD WITHOUT CONTRAST
MRA NECK WITHOUT CONTRAST
TECHNIQUE: Multiplanar, multiecho pulse sequences of the brain and surrounding
structures were obtained without and with intravenous contrast.
Angiographic images of the Circle of Willis were obtained using MRA
technique without intravenous contrast. Angiographic images of the
neck were obtained using MRA technique without intravenous contrast.
Carotid stenosis measurements (when applicable) are obtained
utilizing NASCET criteria, using the distal internal carotid
diameter as the denominator.

[Series 4: DWI · axial · 3.0mm · 1.09mm/px · z∈[-46,+106]mm · 7 of 104 slices shown (1 of 4)]
[im 1/104]
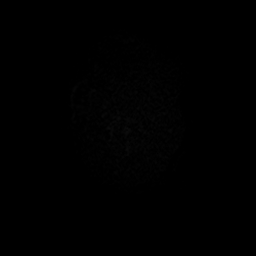
[im 18/104]
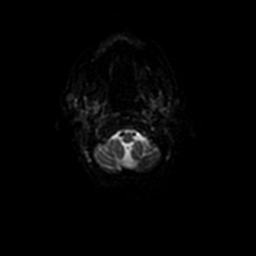
[im 35/104]
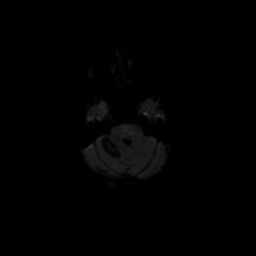
[im 52/104]
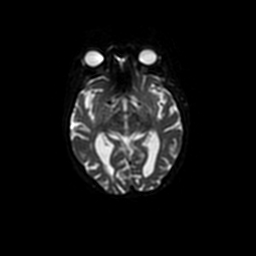
[im 69/104]
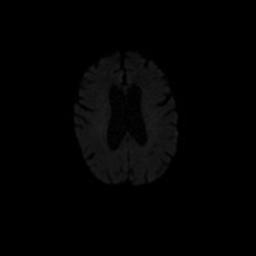
[im 86/104]
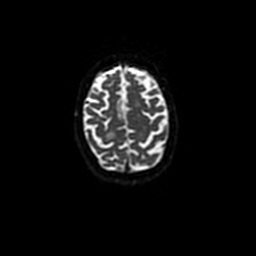
[im 104/104]
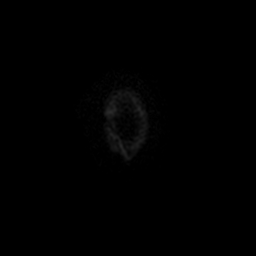

[Series 5: DWI · coronal · 5.0mm · 1.09mm/px · 6 of 70 slices shown (2 of 4)]
[im 1/70]
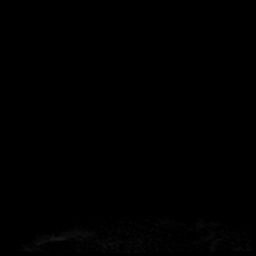
[im 14/70]
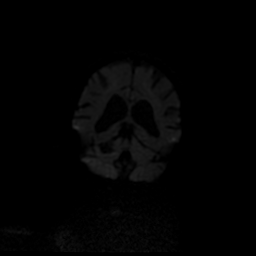
[im 28/70]
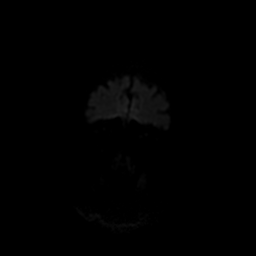
[im 42/70]
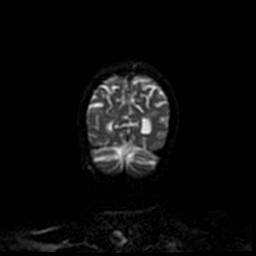
[im 56/70]
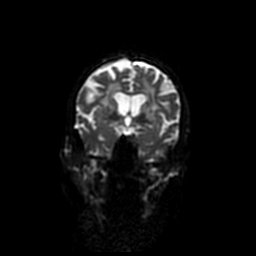
[im 70/70]
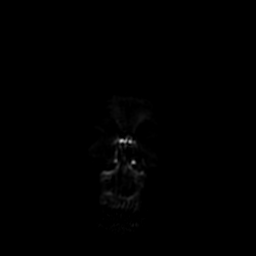

[Series 12: FLAIR · axial · 3.0mm · 0.43mm/px · z∈[-44,+100]mm · 2 of 25 slices shown (1 of 2)]
[im 1/25]
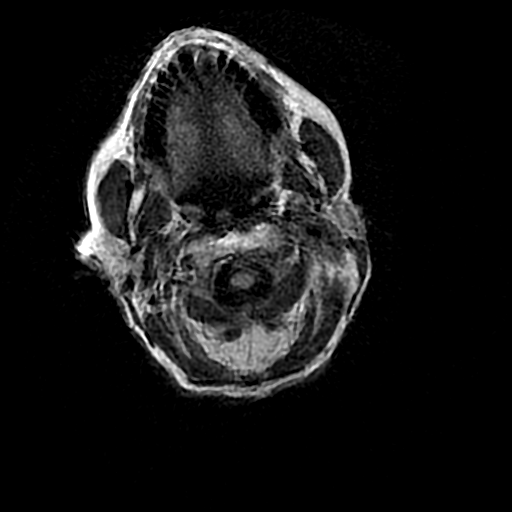
[im 25/25]
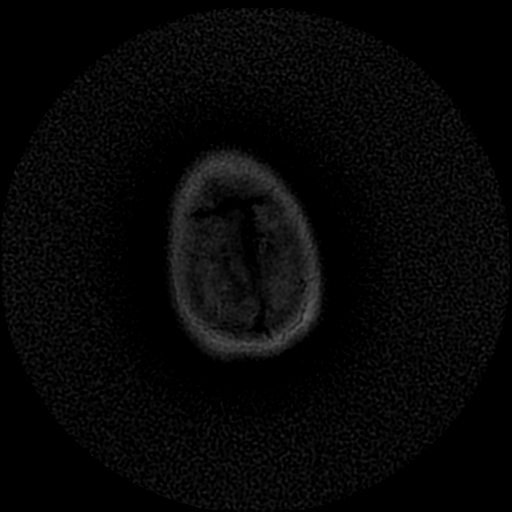

[Series 13: FLAIR · sagittal · 5.0mm · 0.47mm/px · 2 of 23 slices shown (2 of 2)]
[im 1/23]
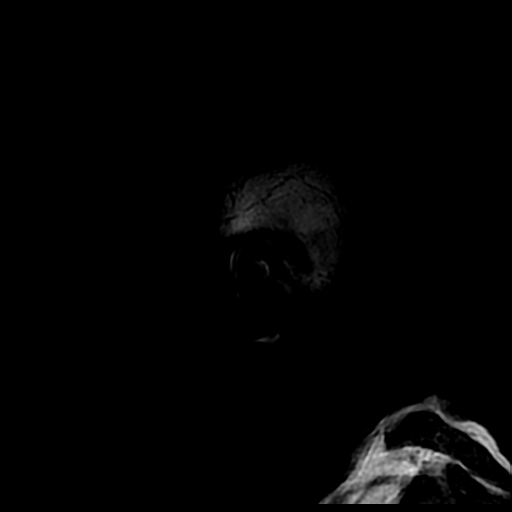
[im 23/23]
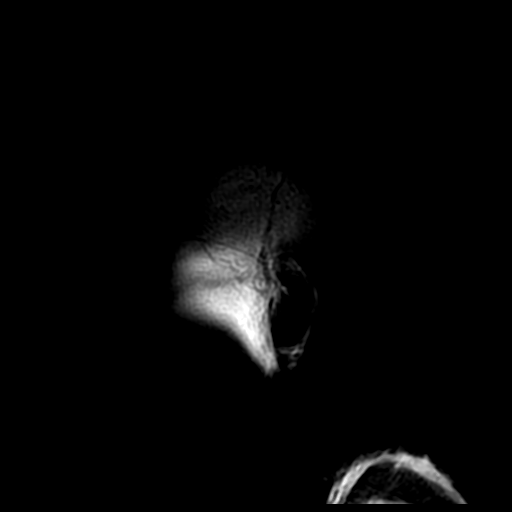

[Series 18: T1 post-contrast · axial · 3.0mm · 0.43mm/px · z∈[-46,+101]mm · 4 of 50 slices shown]
[im 1/50]
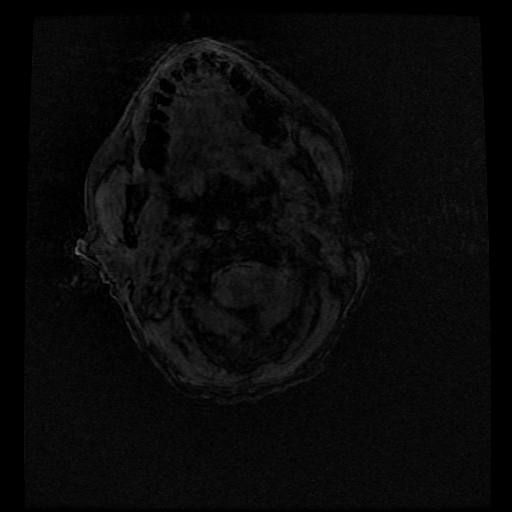
[im 17/50]
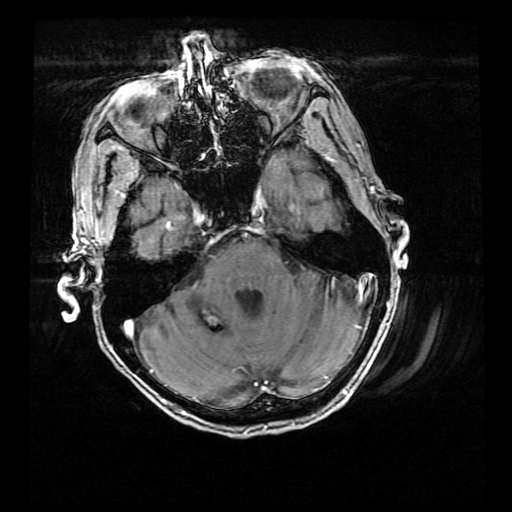
[im 33/50]
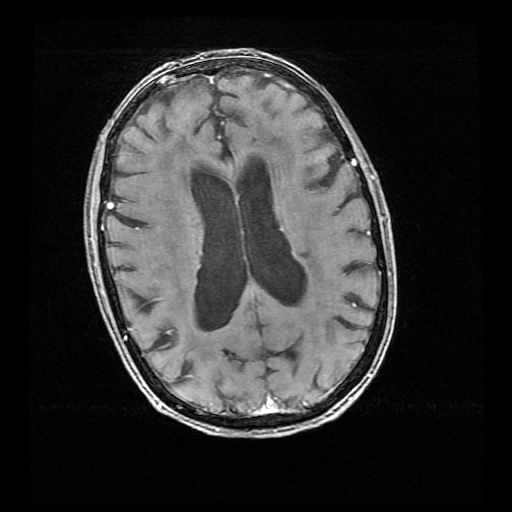
[im 50/50]
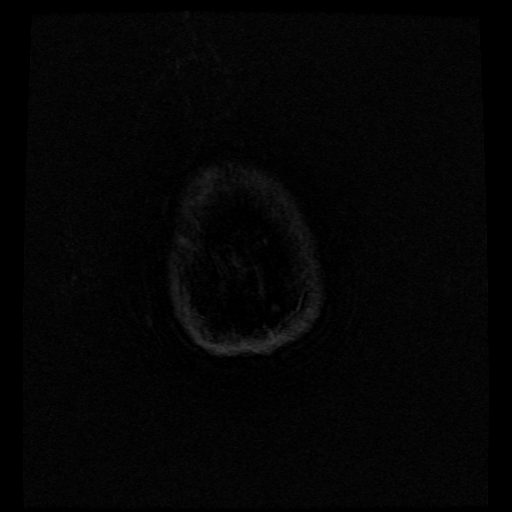

[Series 20: FLAIR post-contrast · sagittal · 5.0mm · 0.47mm/px · 2 of 23 slices shown]
[im 1/23]
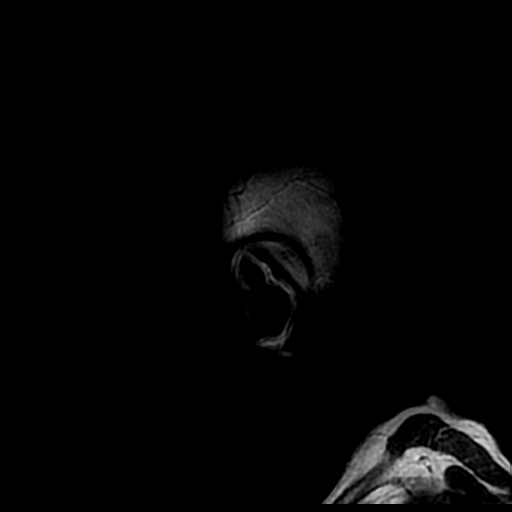
[im 23/23]
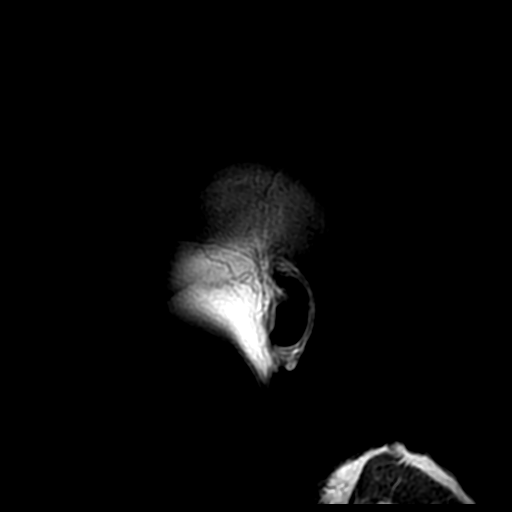

[Series 400: DWI · axial · 3.0mm · 1.09mm/px · z∈[-46,+106]mm · 4 of 52 slices shown (3 of 4)]
[im 1/52]
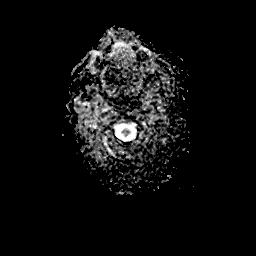
[im 18/52]
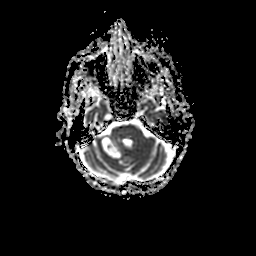
[im 35/52]
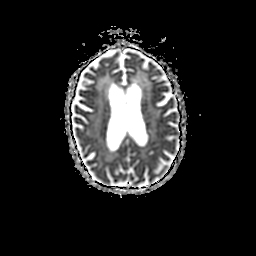
[im 52/52]
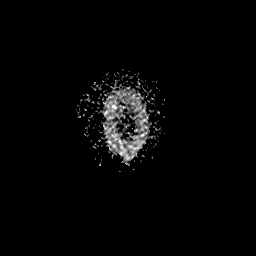

[Series 500: DWI · coronal · 5.0mm · 1.09mm/px · 3 of 35 slices shown (4 of 4)]
[im 1/35]
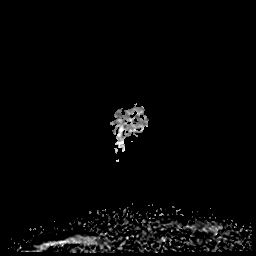
[im 18/35]
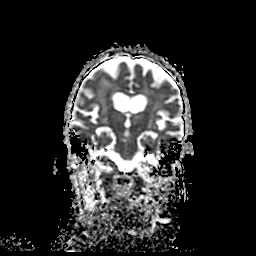
[im 35/35]
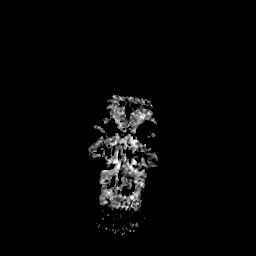

[30 of 48 positions shown; findings below may reference images not displayed]

FINDINGS: Image quality is intermittently motion degraded.

MRI HEAD FINDINGS

Brain: There is no evidence of acute intracranial hemorrhage,
extra-axial fluid collection, or acute infarct.

There is T2/FLAIR signal abnormality with associated susceptibility
artifact in the right cerebellar hemisphere corresponding to the
calcification seen on prior head CT. There is apparent enhancement
within this area of signal abnormality measuring approximately
cm x 0.6 cm (18-18). There is a background of moderate global
parenchymal volume loss with prominence of the ventricular system
and extra-axial CSF spaces. Confluent FLAIR signal abnormality in
the subcortical and periventricular white matter likely reflects
sequela of chronic white matter microangiopathy.

There is no solid mass lesion. There is no other abnormal
enhancement. There is no mass effect or midline shift.

Vascular: The major intracranial flow voids are present. The
vasculature is assessed in full below.

Skull and upper cervical spine: There is no definite signal
abnormality, though the sagittal T1 sequence is markedly motion
degraded.

Sinuses/Orbits: The paranasal sinuses are clear. Bilateral lens
implants are in place. The globes and orbits are otherwise
unremarkable.

Other: None.

MRA HEAD FINDINGS

Anterior circulation: The intracranial ICAs are patent, without
hemodynamically significant stenosis or occlusion.

The bilateral MCAs are patent.

The bilateral ACAs are patent. The anterior communicating artery is
not definitely seen.

There is no aneurysm or AVM.

Posterior circulation: The bilateral V4 segments are patent. The
PICA origin is identified on the left but not well seen on the
right. The basilar artery is patent.

There is mild irregularity of the right P1 segment. There is
short-segment moderate to severe stenosis of the right P2 segment
(3-96). There is mild irregularity of the distal right PCA branches.
The left PCA is patent with mild atherosclerotic irregularity. There
is small bilateral posterior communicating arteries are identified.

Anatomic variants: None.

MRA NECK FINDINGS

Aortic arch: The imaged aortic arch is unremarkable. The origins of
the left common carotid and left subclavian arteries are patent. The
brachiocephalic origin is not imaged.

Right carotid system: The right common, internal, and external
carotid arteries appear patent, without evidence of hemodynamically
significant stenosis or occlusion.

Left carotid system: The left common, internal, and external carotid
arteries appear patent, without evidence of hemodynamically
significant stenosis or occlusion.

Vertebral arteries: Vertebral arteries are patent with antegrade
flow. There is no evidence of hemodynamically significant stenosis
or occlusion.

Other: None.
IMPRESSION: MR HEAD:

1. No evidence of acute intracranial pathology. No acute infarct or
hemorrhage.
2. Signal abnormality in the right cerebellar hemisphere corresponds
to the area of encephalomalacia and dystrophic calcification seen on
prior CT with nodular enhancement within the area of signal
abnormality. Follow-up MRI with contrast in 2-3 months may be
considered to assess for stability, given the history of lung mass.
3. Moderate global parenchymal volume loss and chronic white matter
microangiopathy.

MRA HEAD/NECK:

1. Short-segment moderate to severe stenosis of the right P2
segment.
2. Otherwise, mild atherosclerotic irregularity of the intracranial
vasculature without other high-grade stenosis or occlusion.
3. Patent vasculature of the neck with no evidence of
hemodynamically significant stenosis or occlusion.

## 2021-11-22 IMAGING — MR MR MRA NECK W/O CM
1 series · 17 of 48 positions shown · IV contrast (agent unspecified)
Comparison: Noncontrast CT head dated 1 day prior, [DATE]

CLINICAL DATA: Stroke/TIA

EXAM:
MRI HEAD WITHOUT AND WITH CONTRAST
MRA HEAD WITHOUT CONTRAST
MRA NECK WITHOUT CONTRAST
TECHNIQUE: Multiplanar, multiecho pulse sequences of the brain and surrounding
structures were obtained without and with intravenous contrast.
Angiographic images of the Circle of Willis were obtained using MRA
technique without intravenous contrast. Angiographic images of the
neck were obtained using MRA technique without intravenous contrast.
Carotid stenosis measurements (when applicable) are obtained
utilizing NASCET criteria, using the distal internal carotid
diameter as the denominator.

[Series 16: ax (id) · axial · 2.8mm · 0.47mm/px · z∈[-175,-32]mm · 17 of 108 slices shown]
[im 1/108]
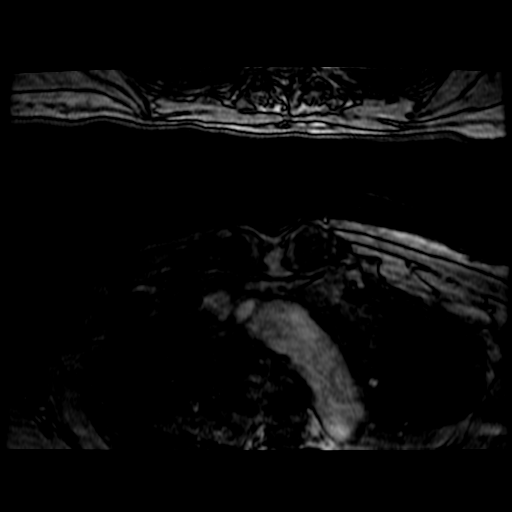
[im 3/108]
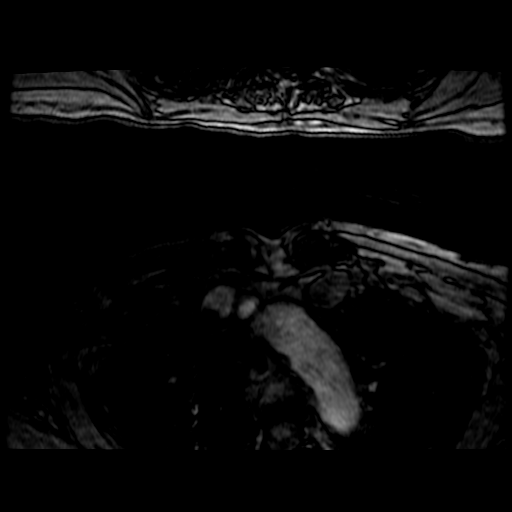
[im 5/108]
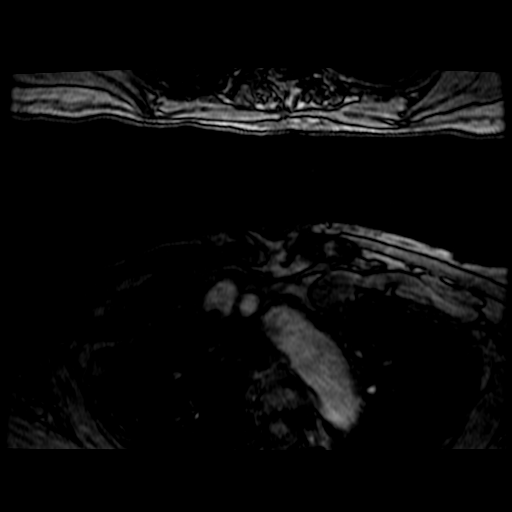
[im 7/108]
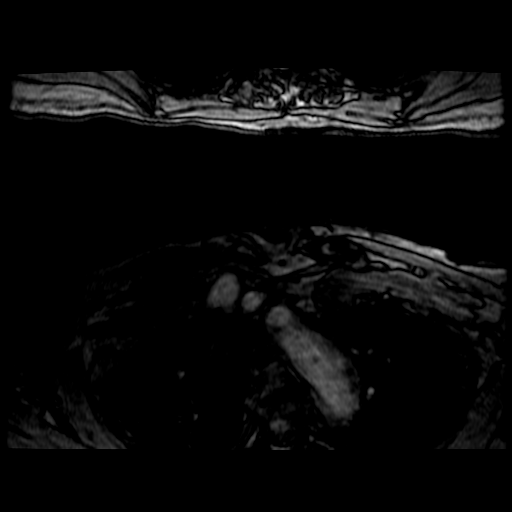
[im 10/108]
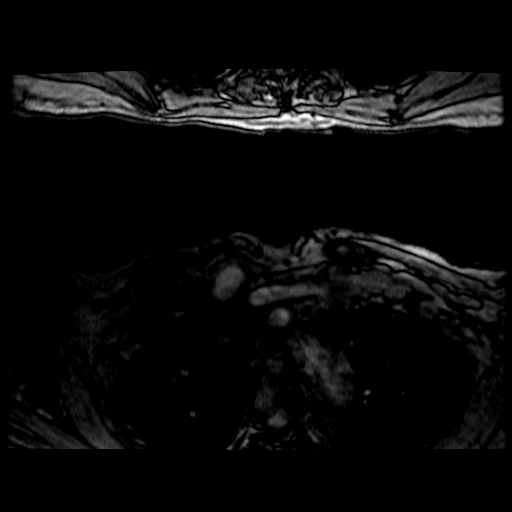
[im 12/108]
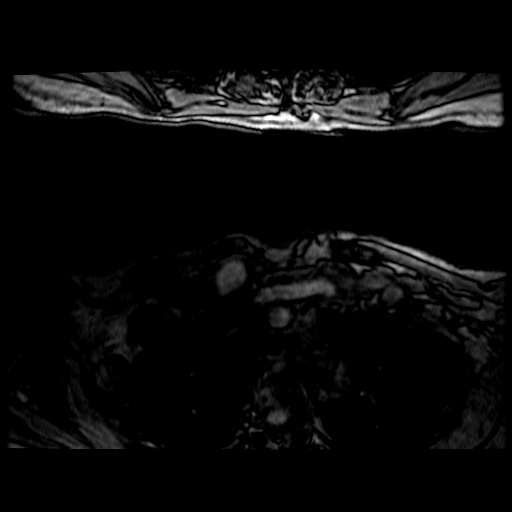
[im 14/108]
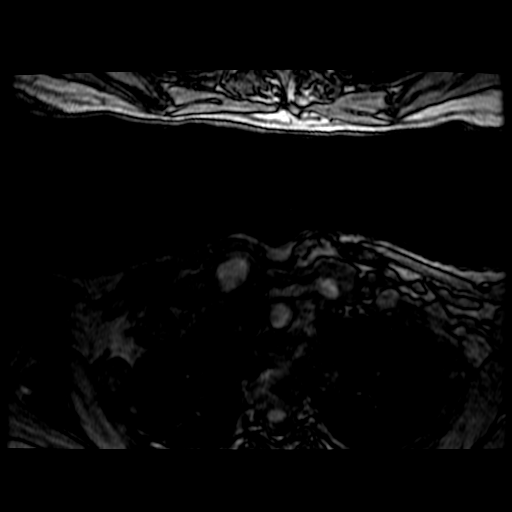
[im 19/108]
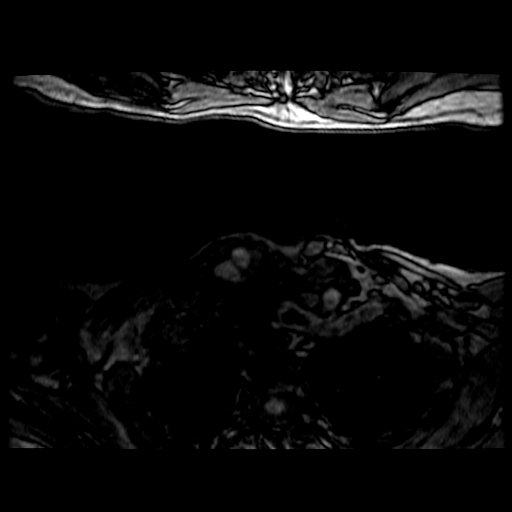
[im 21/108]
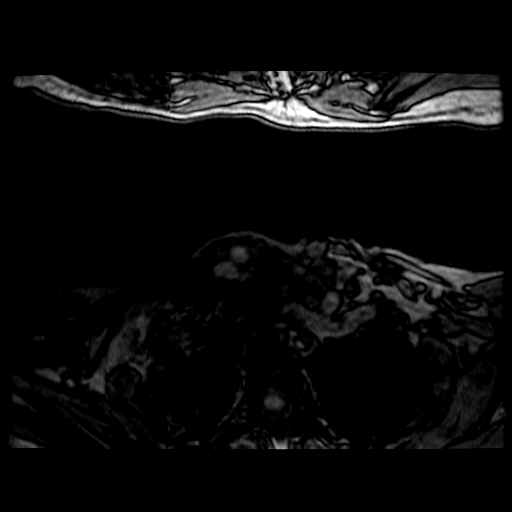
[im 35/108]
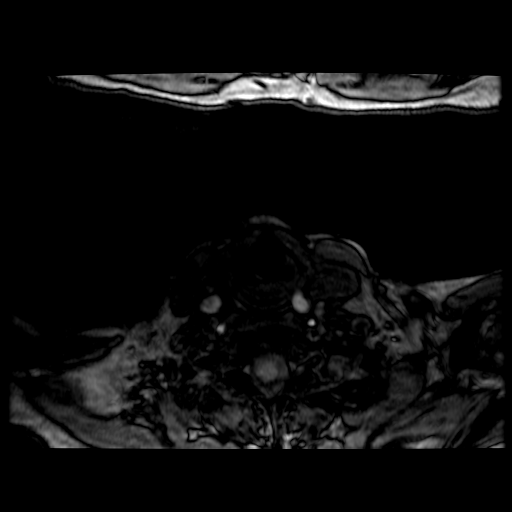
[im 48/108]
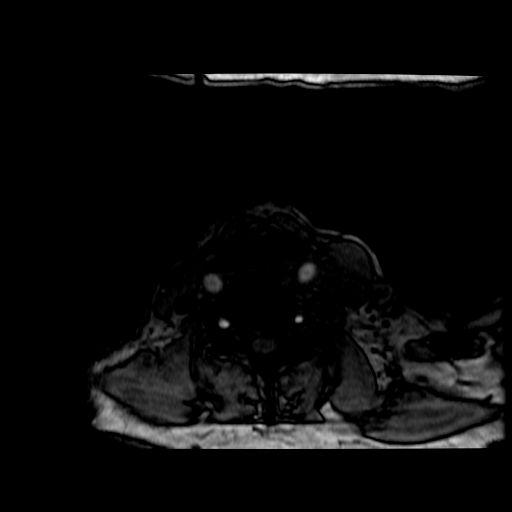
[im 55/108]
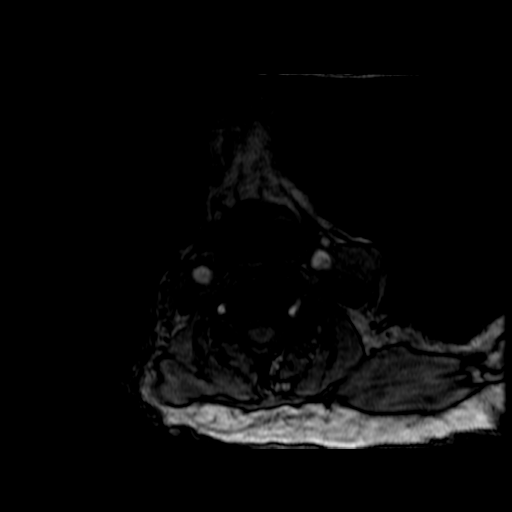
[im 62/108]
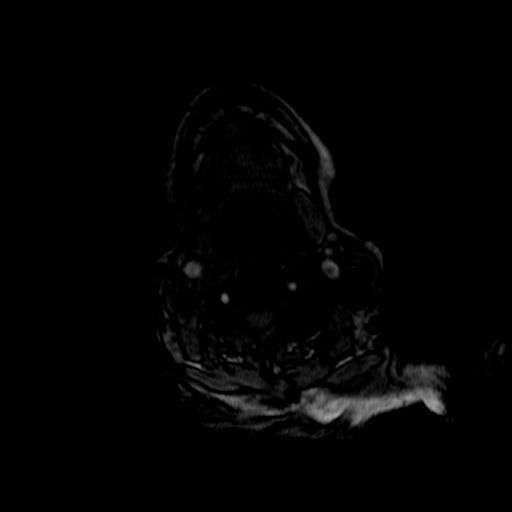
[im 76/108]
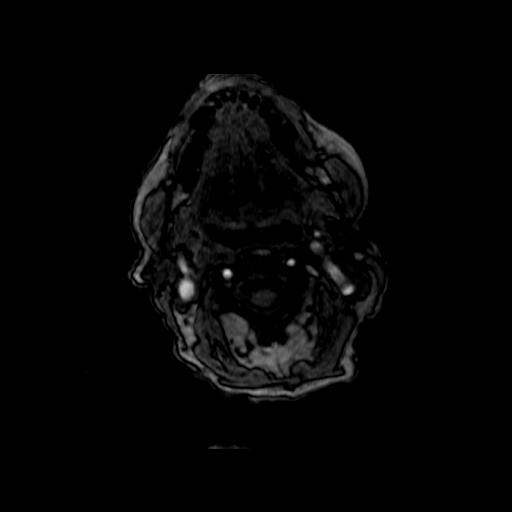
[im 89/108]
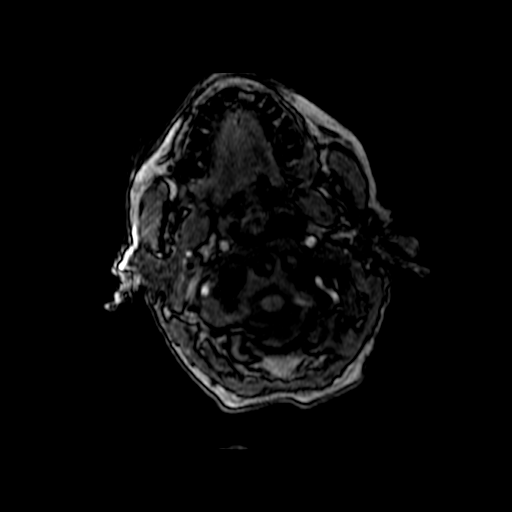
[im 92/108]
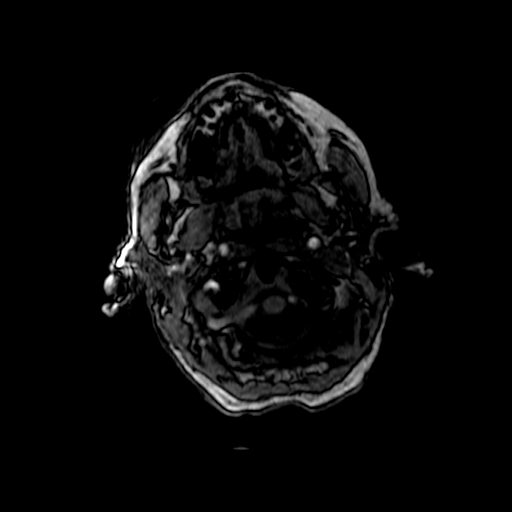
[im 103/108]
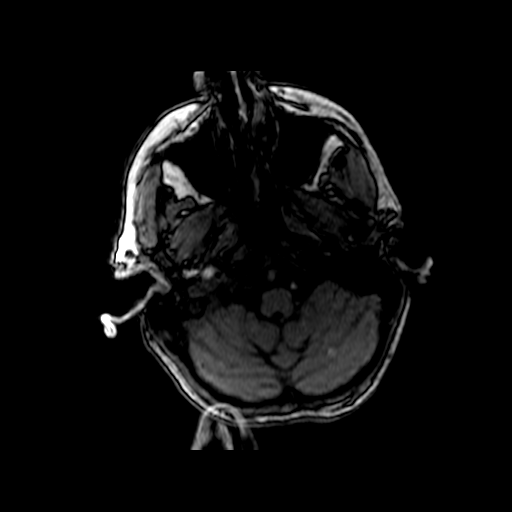

[17 of 48 positions shown; findings below may reference images not displayed]

FINDINGS: Image quality is intermittently motion degraded.

MRI HEAD FINDINGS

Brain: There is no evidence of acute intracranial hemorrhage,
extra-axial fluid collection, or acute infarct.

There is T2/FLAIR signal abnormality with associated susceptibility
artifact in the right cerebellar hemisphere corresponding to the
calcification seen on prior head CT. There is apparent enhancement
within this area of signal abnormality measuring approximately
cm x 0.6 cm (18-18). There is a background of moderate global
parenchymal volume loss with prominence of the ventricular system
and extra-axial CSF spaces. Confluent FLAIR signal abnormality in
the subcortical and periventricular white matter likely reflects
sequela of chronic white matter microangiopathy.

There is no solid mass lesion. There is no other abnormal
enhancement. There is no mass effect or midline shift.

Vascular: The major intracranial flow voids are present. The
vasculature is assessed in full below.

Skull and upper cervical spine: There is no definite signal
abnormality, though the sagittal T1 sequence is markedly motion
degraded.

Sinuses/Orbits: The paranasal sinuses are clear. Bilateral lens
implants are in place. The globes and orbits are otherwise
unremarkable.

Other: None.

MRA HEAD FINDINGS

Anterior circulation: The intracranial ICAs are patent, without
hemodynamically significant stenosis or occlusion.

The bilateral MCAs are patent.

The bilateral ACAs are patent. The anterior communicating artery is
not definitely seen.

There is no aneurysm or AVM.

Posterior circulation: The bilateral V4 segments are patent. The
PICA origin is identified on the left but not well seen on the
right. The basilar artery is patent.

There is mild irregularity of the right P1 segment. There is
short-segment moderate to severe stenosis of the right P2 segment
(3-96). There is mild irregularity of the distal right PCA branches.
The left PCA is patent with mild atherosclerotic irregularity. There
is small bilateral posterior communicating arteries are identified.

Anatomic variants: None.

MRA NECK FINDINGS

Aortic arch: The imaged aortic arch is unremarkable. The origins of
the left common carotid and left subclavian arteries are patent. The
brachiocephalic origin is not imaged.

Right carotid system: The right common, internal, and external
carotid arteries appear patent, without evidence of hemodynamically
significant stenosis or occlusion.

Left carotid system: The left common, internal, and external carotid
arteries appear patent, without evidence of hemodynamically
significant stenosis or occlusion.

Vertebral arteries: Vertebral arteries are patent with antegrade
flow. There is no evidence of hemodynamically significant stenosis
or occlusion.

Other: None.
IMPRESSION: MR HEAD:

1. No evidence of acute intracranial pathology. No acute infarct or
hemorrhage.
2. Signal abnormality in the right cerebellar hemisphere corresponds
to the area of encephalomalacia and dystrophic calcification seen on
prior CT with nodular enhancement within the area of signal
abnormality. Follow-up MRI with contrast in 2-3 months may be
considered to assess for stability, given the history of lung mass.
3. Moderate global parenchymal volume loss and chronic white matter
microangiopathy.

MRA HEAD/NECK:

1. Short-segment moderate to severe stenosis of the right P2
segment.
2. Otherwise, mild atherosclerotic irregularity of the intracranial
vasculature without other high-grade stenosis or occlusion.
3. Patent vasculature of the neck with no evidence of
hemodynamically significant stenosis or occlusion.

## 2021-11-22 IMAGING — MR MR MRA HEAD W/O CM
1 series · 17 of 48 positions shown · IV contrast (agent unspecified)
Comparison: Noncontrast CT head dated 1 day prior, [DATE]

CLINICAL DATA: Stroke/TIA

EXAM:
MRI HEAD WITHOUT AND WITH CONTRAST
MRA HEAD WITHOUT CONTRAST
MRA NECK WITHOUT CONTRAST
TECHNIQUE: Multiplanar, multiecho pulse sequences of the brain and surrounding
structures were obtained without and with intravenous contrast.
Angiographic images of the Circle of Willis were obtained using MRA
technique without intravenous contrast. Angiographic images of the
neck were obtained using MRA technique without intravenous contrast.
Carotid stenosis measurements (when applicable) are obtained
utilizing NASCET criteria, using the distal internal carotid
diameter as the denominator.

[Series 3: (id) mt fs · axial · 1.4mm · 0.43mm/px · z∈[-50,+41]mm · 17 of 136 slices shown]
[im 1/136]
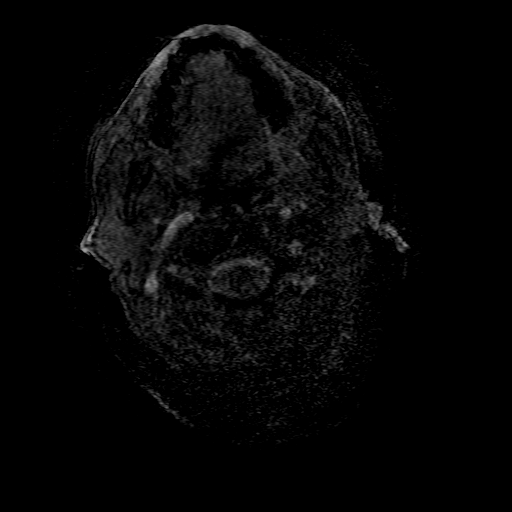
[im 3/136]
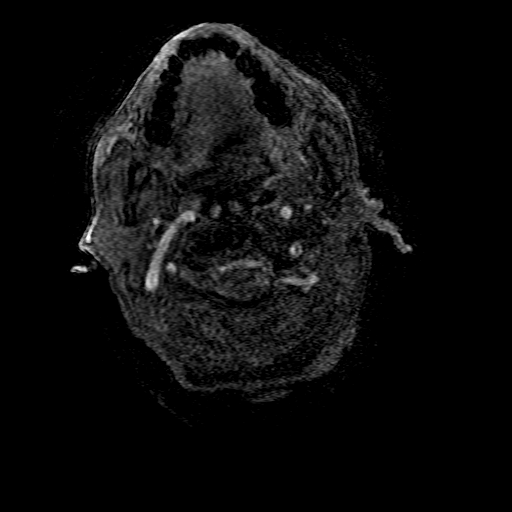
[im 6/136]
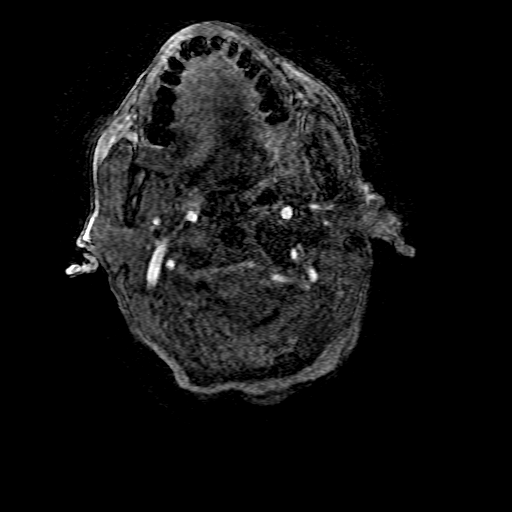
[im 9/136]
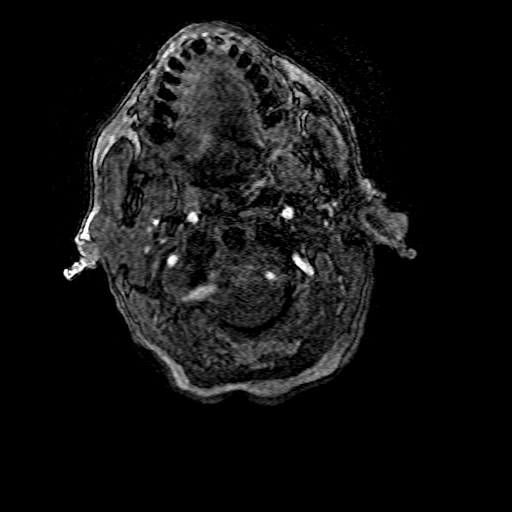
[im 12/136]
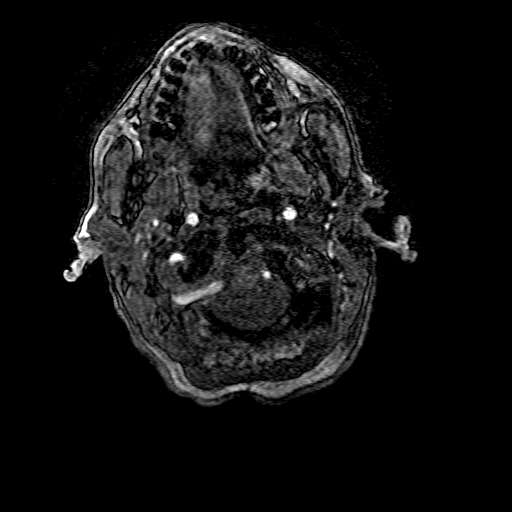
[im 15/136]
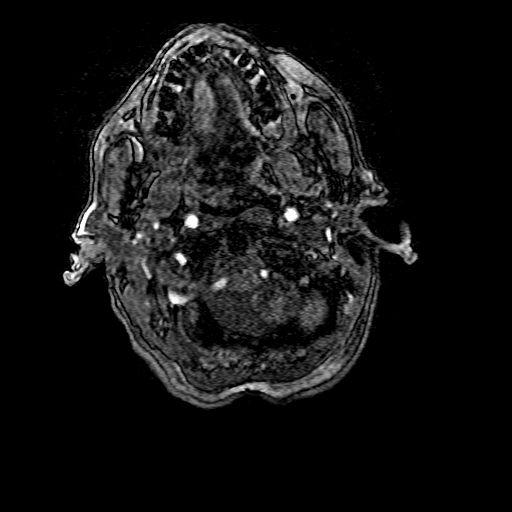
[im 18/136]
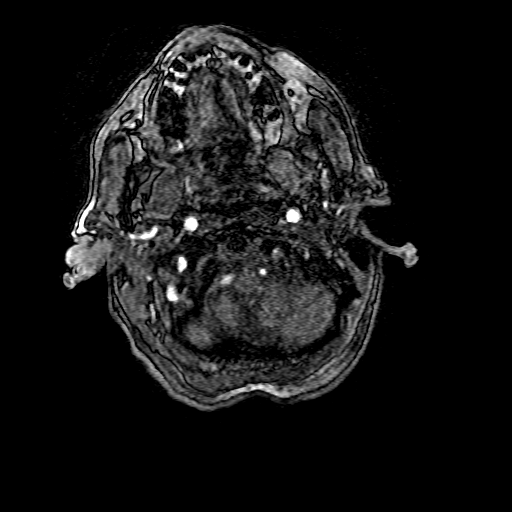
[im 23/136]
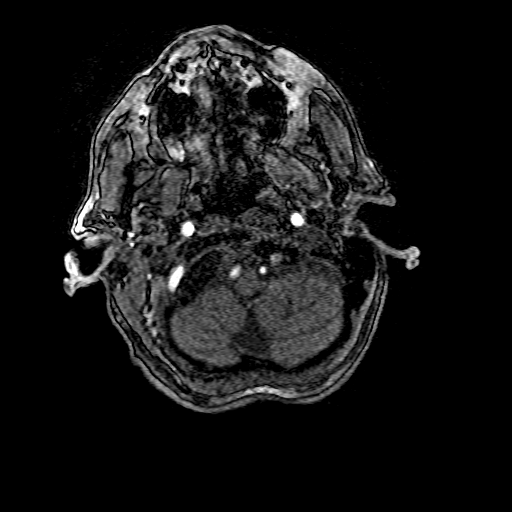
[im 26/136]
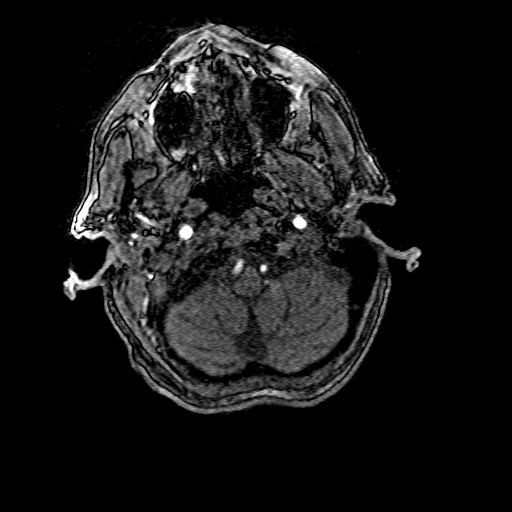
[im 44/136]
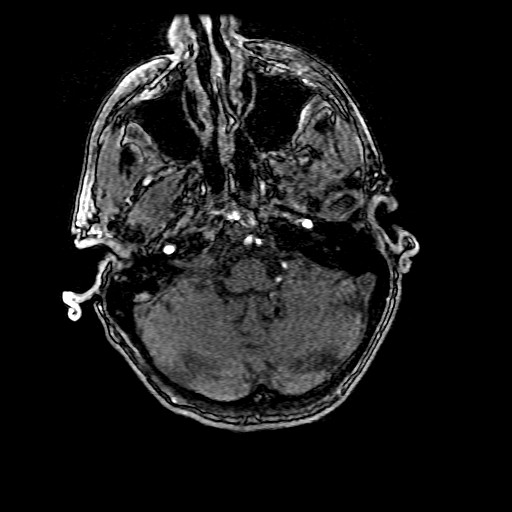
[im 61/136]
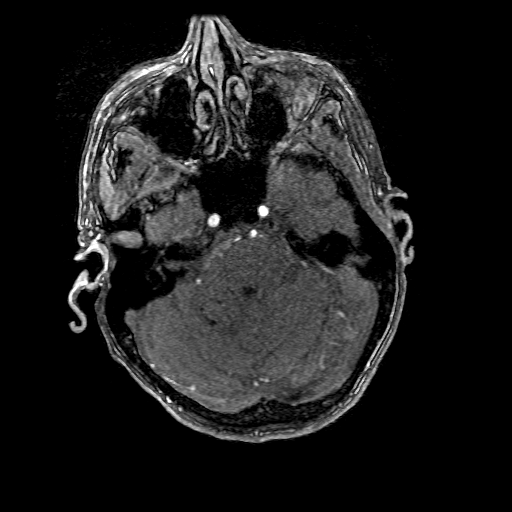
[im 69/136]
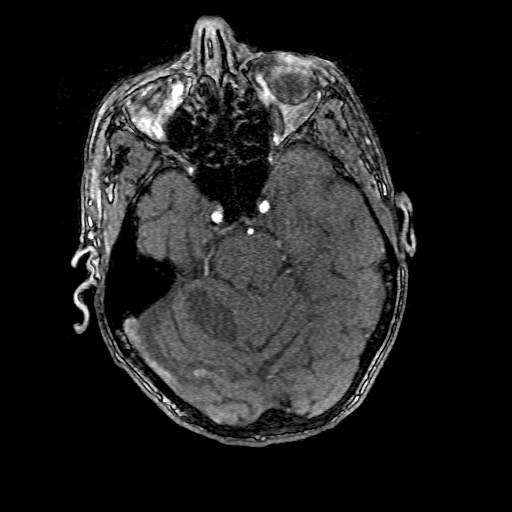
[im 78/136]
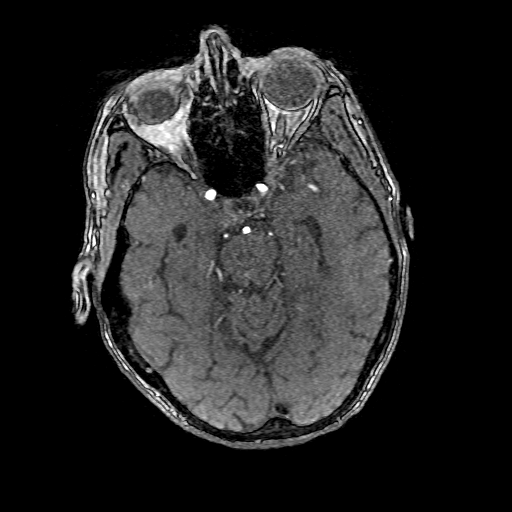
[im 95/136]
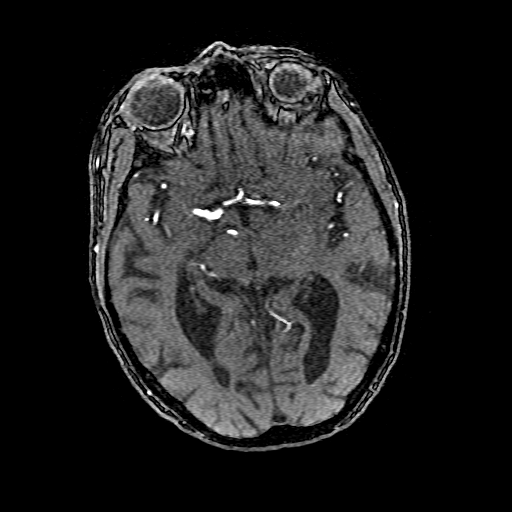
[im 113/136]
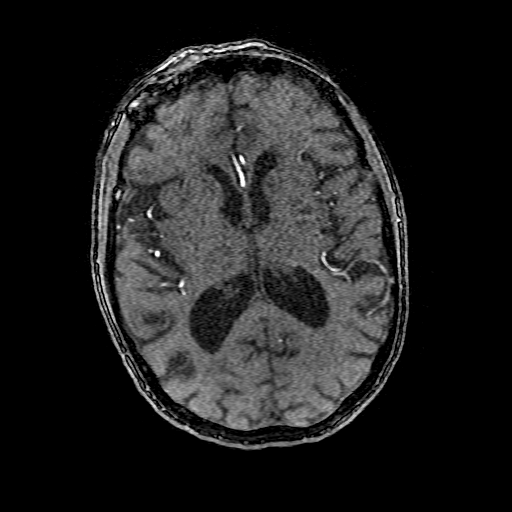
[im 115/136]
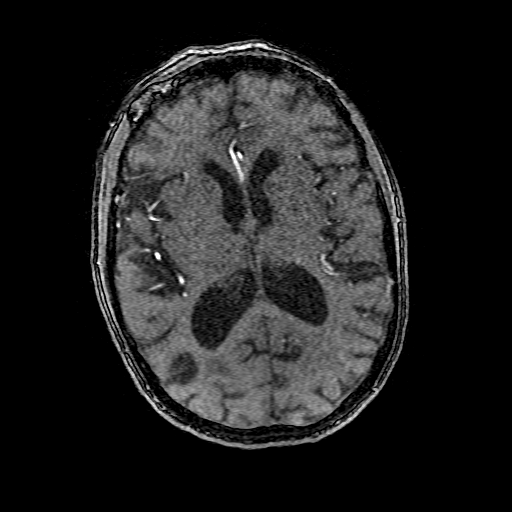
[im 130/136]
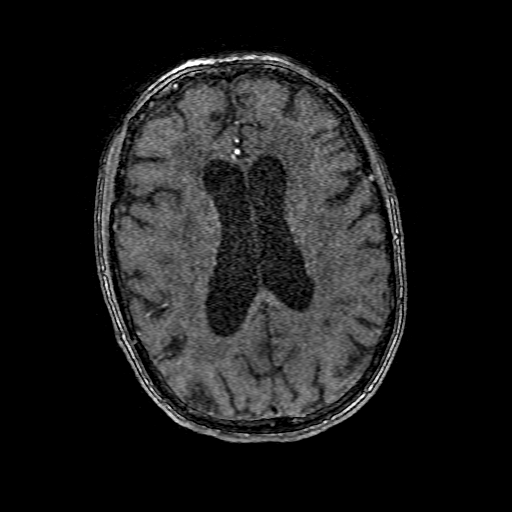

[17 of 48 positions shown; findings below may reference images not displayed]

FINDINGS: Image quality is intermittently motion degraded.

MRI HEAD FINDINGS

Brain: There is no evidence of acute intracranial hemorrhage,
extra-axial fluid collection, or acute infarct.

There is T2/FLAIR signal abnormality with associated susceptibility
artifact in the right cerebellar hemisphere corresponding to the
calcification seen on prior head CT. There is apparent enhancement
within this area of signal abnormality measuring approximately
cm x 0.6 cm (18-18). There is a background of moderate global
parenchymal volume loss with prominence of the ventricular system
and extra-axial CSF spaces. Confluent FLAIR signal abnormality in
the subcortical and periventricular white matter likely reflects
sequela of chronic white matter microangiopathy.

There is no solid mass lesion. There is no other abnormal
enhancement. There is no mass effect or midline shift.

Vascular: The major intracranial flow voids are present. The
vasculature is assessed in full below.

Skull and upper cervical spine: There is no definite signal
abnormality, though the sagittal T1 sequence is markedly motion
degraded.

Sinuses/Orbits: The paranasal sinuses are clear. Bilateral lens
implants are in place. The globes and orbits are otherwise
unremarkable.

Other: None.

MRA HEAD FINDINGS

Anterior circulation: The intracranial ICAs are patent, without
hemodynamically significant stenosis or occlusion.

The bilateral MCAs are patent.

The bilateral ACAs are patent. The anterior communicating artery is
not definitely seen.

There is no aneurysm or AVM.

Posterior circulation: The bilateral V4 segments are patent. The
PICA origin is identified on the left but not well seen on the
right. The basilar artery is patent.

There is mild irregularity of the right P1 segment. There is
short-segment moderate to severe stenosis of the right P2 segment
(3-96). There is mild irregularity of the distal right PCA branches.
The left PCA is patent with mild atherosclerotic irregularity. There
is small bilateral posterior communicating arteries are identified.

Anatomic variants: None.

MRA NECK FINDINGS

Aortic arch: The imaged aortic arch is unremarkable. The origins of
the left common carotid and left subclavian arteries are patent. The
brachiocephalic origin is not imaged.

Right carotid system: The right common, internal, and external
carotid arteries appear patent, without evidence of hemodynamically
significant stenosis or occlusion.

Left carotid system: The left common, internal, and external carotid
arteries appear patent, without evidence of hemodynamically
significant stenosis or occlusion.

Vertebral arteries: Vertebral arteries are patent with antegrade
flow. There is no evidence of hemodynamically significant stenosis
or occlusion.

Other: None.
IMPRESSION: MR HEAD:

1. No evidence of acute intracranial pathology. No acute infarct or
hemorrhage.
2. Signal abnormality in the right cerebellar hemisphere corresponds
to the area of encephalomalacia and dystrophic calcification seen on
prior CT with nodular enhancement within the area of signal
abnormality. Follow-up MRI with contrast in 2-3 months may be
considered to assess for stability, given the history of lung mass.
3. Moderate global parenchymal volume loss and chronic white matter
microangiopathy.

MRA HEAD/NECK:

1. Short-segment moderate to severe stenosis of the right P2
segment.
2. Otherwise, mild atherosclerotic irregularity of the intracranial
vasculature without other high-grade stenosis or occlusion.
3. Patent vasculature of the neck with no evidence of
hemodynamically significant stenosis or occlusion.

## 2021-11-22 MED ORDER — LEVOTHYROXINE SODIUM 112 MCG PO TABS: 112.0000 ug | ORAL_TABLET | Freq: Every day | ORAL | 2 refills | Status: DC

## 2021-11-22 MED ORDER — ROSUVASTATIN CALCIUM 10 MG PO TABS: 10.0000 mg | ORAL_TABLET | Freq: Every day | ORAL | 2 refills | Status: DC

## 2021-11-22 MED ORDER — GADOBUTROL 1 MMOL/ML IV SOLN
5.0000 mL | Freq: Once | INTRAVENOUS | Status: AC | PRN
  Administered 2021-11-22: 5 mL via INTRAVENOUS

## 2021-11-22 MED ORDER — OXYCODONE HCL 5 MG PO TABS: 5.0000 mg | ORAL_TABLET | Freq: Four times a day (QID) | ORAL | Status: DC | PRN

## 2021-11-22 MED ORDER — RIVAROXABAN 10 MG PO TABS
10.0000 mg | ORAL_TABLET | Freq: Every day | ORAL | Status: DC
  Administered 2021-11-22: 10 mg via ORAL
  Filled 2021-11-22: qty 1

## 2021-11-22 MED ORDER — METOPROLOL SUCCINATE ER 25 MG PO TB24
25.0000 mg | ORAL_TABLET | Freq: Every day | ORAL | Status: DC
  Administered 2021-11-22: 25 mg via ORAL
  Filled 2021-11-22: qty 1

## 2021-11-22 MED ORDER — ROSUVASTATIN CALCIUM 5 MG PO TABS
10.0000 mg | ORAL_TABLET | Freq: Every day | ORAL | Status: DC
  Administered 2021-11-22: 10 mg via ORAL
  Filled 2021-11-22: qty 2

## 2021-11-22 NOTE — Progress Notes (Signed)
Informed of MRI for today.  ? ?Device system confirmed to be MRI conditional, with implant date > 6 weeks ago, and no evidence of abandoned or epicardial leads in review of most recent CXR ? ?Interrogation from today reviewed by rep. OK to follow their recommendation of AOO 75 for MRI.  ? ?Tachy-therapies to off if applicable. ? ?Program device back to pre-MRI settings after completion of exam. ? ?Shirley Friar, PA-C  ?11/22/2021 12:39 PM   ?

## 2021-11-22 NOTE — ED Notes (Signed)
Breakfast tray set up for pt.  ?

## 2021-11-22 NOTE — ED Notes (Signed)
Repositioned pt for comfort. Pt denies any needs at this time, resting in bed, denies any needs at this time. Purewick in place. Call light within reach, lights set to pt's comfort, bed locked. ?

## 2021-11-22 NOTE — Care Management Obs Status (Signed)
MEDICARE OBSERVATION STATUS NOTIFICATION ? ? ?Patient Details  ?Name: Monica Murillo ?MRN: [id number] ?Date of Birth: 12-11-1932 ? ? ?Medicare Observation Status Notification Given:  Yes ? ? ? ?Cyndi Bender, RN ?11/22/2021, 4:25 PM ?

## 2021-11-22 NOTE — ED Notes (Signed)
MRI called about delay on MRI, stated that since pt has pacemaker it requires monitoring during scan that is not provided at this time.  ?

## 2021-11-22 NOTE — Evaluation (Signed)
Clinical/Bedside Swallow Evaluation ?Patient Details  ?Name: FLORIE CARICO ?MRN: [id number] ?Date of Birth: [birth year] ? ?Today's Date: 11/22/2021 ?Time: SLP Start Time (ACUTE ONLY): 4403 SLP Stop Time (ACUTE ONLY): 1625 ?SLP Time Calculation (min) (ACUTE ONLY): 20 min ? ?Past Medical History:  ?Past Medical History:  ?Diagnosis Date  ? Anemia   ? Heart block   ? History of blood transfusion   ? History of heart artery stent   ? Hypertension   ? Hypothyroid   ? Pacemaker   ? Pulmonary fibrosis ([place])   ? Respiratory failure, chronic ([place])   ? Type 2 diabetes mellitus ([place])   ? ?Past Surgical History:  ?Past Surgical History:  ?Procedure Laterality Date  ? ABDOMINAL HYSTERECTOMY  1981  ? BREAST LUMPECTOMY  2011  ? CARDIAC PACEMAKER PLACEMENT  2018  ? CORONARY STENT PLACEMENT  2012  ? HIP FRACTURE SURGERY Right 08/20/2021  ? Per patients daughter  ? INTRAMEDULLARY (IM) NAIL INTERTROCHANTERIC Right 08/20/2021  ? Procedure: INTRAMEDULLARY (IM) NAIL INTERTROCHANTRIC;  Surgeon: [name], MD;  Location: [facility];  Service: Orthopedics;  Laterality: Right;  ? ?HPI:  ?Pt is a 88 y/o female presenting on 11/21/21 after difficulty talking temporarily before resolving. CT negative, MRI completed but results pending. Found with TIA. PMH includes: anemia, HTN, pulmonary fibrosis, DM-2.  ?  ?Assessment / Plan / Recommendation  ?Clinical Impression ? Patient is not currently presenting with clinical s/s of dysphagia as per this bedside/clinical swallow evaluation. Patient reported several week h/o decreased appetite but denied ever having any s/s dysphagia. SLP observed patient consume straw sips of thin liquids (water) and she demonstrated timely swallow initiation and no overt s/s aspiration or penetration. SpO2 remained at 100%. SLP is not recommending further assessment or intervention at this time. ?SLP Visit Diagnosis: Dysphagia, unspecified (R13.10) ?   ?Aspiration Risk ? No limitations  ?  ?Diet Recommendation Regular;Thin  liquid  ? ?Liquid Administration via: Cup;Straw ?Medication Administration: Whole meds with liquid ?Postural Changes: Seated upright at 90 degrees  ?  ?Other  Recommendations Oral Care Recommendations: Oral care BID   ? ?Recommendations for follow up therapy are one component of a multi-disciplinary discharge planning process, led by the attending physician.  Recommendations may be updated based on patient status, additional functional criteria and insurance authorization. ? ?Follow up Recommendations No SLP follow up  ? ? ?  ?Assistance Recommended at Discharge None  ?Functional Status Assessment Patient has not had a recent decline in their functional status  ?Frequency and Duration   N/A ?  ?  ?   ? ?Prognosis   N/A ? ?  ? ?Swallow Study   ?General Date of Onset: 11/22/21 ?HPI: Pt is a 88 y/o female presenting on 11/21/21 after difficulty talking temporarily before resolving. CT negative, MRI completed but results pending. Found with TIA. PMH includes: anemia, HTN, pulmonary fibrosis, DM-2. ?Type of Study: Bedside Swallow Evaluation ?Previous Swallow Assessment: none found ?Diet Prior to this Study: Regular;Thin liquids ?Temperature Spikes Noted: No ?Respiratory Status: Nasal cannula ?History of Recent Intubation: No ?Behavior/Cognition: Alert;Cooperative;Pleasant mood ?Oral Cavity Assessment: Within Functional Limits ?Oral Care Completed by SLP: No ?Oral Cavity - Dentition: Adequate natural dentition ?Vision: Functional for self-feeding ?Self-Feeding Abilities: Able to feed self ?Patient Positioning: Upright in bed ?Baseline Vocal Quality: Normal ?Volitional Cough: Strong ?Volitional Swallow: Able to elicit  ?  ?Oral/Motor/Sensory Function Overall Oral Motor/Sensory Function: Within functional limits   ?Ice Chips     ?Thin Liquid Thin Liquid: Within functional  limits ?Presentation: Straw;Self Fed  ?  ?Nectar Thick     ?Honey Thick     ?Puree Puree: Not tested   ?Solid ? ? ?  Solid: Not tested  ? ?  ?Sonia Baller, MA, CCC-SLP ?Speech Therapy ? ?

## 2021-11-22 NOTE — Progress Notes (Signed)
?  Echocardiogram ?2D Echocardiogram has been performed. ? ?Monica Murillo ?11/22/2021, 11:55 AM ?

## 2021-11-22 NOTE — Discharge Summary (Signed)
?[facility] ? ?Physician Discharge Summary  ? ?Patient ID: ?Monica Murillo ?MRN: [id number] ?DOB/AGE: 30-Sep-1932 88 y.o. ? ?Admit date: 11/21/2021 ?Discharge date: 11/22/2021   ? ?PCP: Yvonna Alanis, NP ? ?DISCHARGE DIAGNOSES:  ?Principal Problem: ?  Aphasia secondary to suspected TIA (transient ischemic attack) ?Active Problems: ?  Lung mass ?  Complete heart block ([place]) ?  PAF (paroxysmal atrial fibrillation) on chronic anticoagulation (HCC) Complete heart s/p pacemaker ?  Normocytic anemia ?  Hypothyroidism ?  Essential hypertension ?  Memory impairment ?  Pulmonary fibrosis ([place]) ?  Aphasia ? ? ?RECOMMENDATIONS FOR OUTPATIENT FOLLOW UP: ?Patient to keep her appointment with pulmonology tomorrow ?Thyroid function tests need to be rechecked in 3 to 4 weeks ?Blood pressure to be checked daily and consider reinitiation of amlodipine at low-dose if she remains hypertensive ? ? ?Home Health: PT OT ?Equipment/Devices: None ? ?CODE STATUS: DNR ? ?DISCHARGE CONDITION: fair ? ?Diet recommendation: As before ? ?INITIAL HISTORY: ?88 y.o. female with medical history significant of hypertension, CAD s/p stent, paroxysmal atrial fibrillation, complete heart block s/p PM, diabetes mellitus type 2, chronic respiratory failure with hypoxia on 2 L, pulmonary fibrosis, left-sided breast cancer, former smoker, and hypothyroidism presented after being unable to speak briefly on the morning of admission.  Patient did not have any weakness in any 1 side of her body.  She recently established with Dr. Curt Bears with electrophysiology.  She recently moved here from [place].  Under evaluation also for lung cancer with metastases.  Has not been started yet on treatment. ? ?Consultations: ?Neurology ? ?Procedures: ?Transthoracic echocardiogram ?EEG ? ? ?HOSPITAL COURSE:  ? ? ?* Aphasia secondary to suspected TIA (transient ischemic attack) ?Patient presented with transient aphasia.  She did not have any hemiplegia.  Symptoms resolved  within a few minutes. ?Concern is for TIA.   ?Due to presence of pacemaker MRI was delayed.  After it was verified that the pacemaker was MRI conditional patient underwent imaging studies which did not show acute stroke.  Some vascular disease was noted.  Area of encephalomalacia was noted which need follow-up MRI in 2 to 3 months. ?Discussed with neurology who recommends continuing Xarelto.  Statin has been added. ?Outpatient follow-up with neurology. ?Patient seen by PT and OT, SLP.  Home health has been ordered.  All of the above discussed with patient's daughter.   ?LDL noted to be 76.  HbA1c 6.3. ?EEG was unremarkable for epileptiform activity. ? ?Lung mass ?Followed by Dr. Erin Fulling with pulmonology.  Underwent PET scan recently which raised concern for bronchogenic carcinoma with metastases.  CT scan of the head did not show any brain lesions.   ?Based on previous notes it appears that patient is not sure if she wants to proceed with biopsy. ?She has an appointment with Dr. Erin Fulling on 4/4 at 2:30 PM.  Patient and family very keen on keeping this appointment. ? ?Normocytic anemia ?It appears the patient's hemoglobin has been slowly trending downwards.  Stable this morning.  No overt bleeding.  She is on anticoagulation.   ?Will need close monitoring in the outpatient setting. ? ?PAF (paroxysmal atrial fibrillation) on chronic anticoagulation (HCC) Complete heart s/p pacemaker ?Continue metoprolol and Xarelto. ? ?Complete heart block ([place]) ?Status post pacemaker.  This was apparently placed in 2018 while she was in [place].  Established with electrophysiology here in town, Dr. Curt Bears. ? ?Essential hypertension ?Allow permissive hypertension.  Subsequently started back on metoprolol.  Blood pressure is noted to be high.  Discussed with patient and her daughter.  Apparently blood pressures were running low few weeks ago and so her amlodipine was discontinued.  Recommended to patient and her daughter to have blood  pressure checked daily and if it remains high it may not be unreasonable to put her back on amlodipine but perhaps at a lower dose. ? ?Hypothyroidism ?Home medication regimen includes levothyroxine 125 mcg daily.  TSH was 0.062.  Levothyroxine dose was decreased to 112 mcg daily. ?Repeat TSH in 4 to 6 weeks ? ?Memory impairment ?Patient admits that her short-term memory is impaired.  Will need to be pursued in the outpatient setting. ? ?Pulmonary fibrosis ([place]) ?Apparently on home oxygen at 2 L/min. ? ? ?Patient is stable.  Very keen on going home today since that she wants to keep her appointment with pulmonology tomorrow.  Work-up for TIA has been completed.  Okay for discharge today. ? ? ?PERTINENT LABS: ? ?The results of significant diagnostics from this hospitalization (including imaging, microbiology, ancillary and laboratory) are listed below for reference.   ? ? ?Labs: ? ?COVID-19 Labs ? ? ?Lab Results  ?Component Value Date  ? [place] NEGATIVE 08/23/2021  ? [place] NEGATIVE 08/18/2021  ? ? ? ? ?Basic Metabolic Panel: ?Recent Labs  ?Lab 11/21/21 ?1215 11/22/21 ?0428  ?NA 139 137  ?K 3.7 3.8  ?CL 106 107  ?CO2 26 23  ?GLUCOSE 115* 113*  ?BUN 14 9  ?CREATININE 0.68 0.56  ?CALCIUM 9.0 9.0  ?MG 1.7  --   ? ?Liver Function Tests: ?Recent Labs  ?Lab 11/21/21 ?1215  ?AST 15  ?ALT 12  ?ALKPHOS 85  ?BILITOT 0.5  ?PROT 6.1*  ?ALBUMIN 3.5  ? ? ?CBC: ?Recent Labs  ?Lab 11/21/21 ?1215 11/22/21 ?0428  ?WBC 10.4 11.0*  ?NEUTROABS 6.6  --   ?HGB 10.8* 10.6*  ?HCT 34.2* 32.9*  ?MCV 92.2 90.4  ?PLT 301 291  ? ? ?CBG: ?Recent Labs  ?Lab 11/21/21 ?1215 11/22/21 ?1528  ?GLUCAP 107* 100*  ? ? ? ?IMAGING STUDIES ?CT HEAD WO CONTRAST ? ?Result Date: 11/21/2021 ?CLINICAL DATA:  Transient ischemic attack EXAM: CT HEAD WITHOUT CONTRAST TECHNIQUE: Contiguous axial images were obtained from the base of the skull through the vertex without intravenous contrast. RADIATION DOSE REDUCTION: This exam was performed according to the  departmental dose-optimization program which includes automated exposure control, adjustment of the mA and/or kV according to patient size and/or use of iterative reconstruction technique. COMPARISON:  08/18/2021 FINDINGS: Brain: No evidence of acute infarction, hemorrhage, cerebral edema, mass, mass effect, or midline shift. No hydrocephalus or extra-axial fluid collection. Periventricular white matter changes, likely the sequela of chronic small vessel ischemic disease. 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 Vascular: No hyperdense vessel. Atherosclerotic calcifications in the intracranial carotid and vertebral arteries. Skull: Normal. Negative for fracture or focal lesion. Sinuses/Orbits: No acute finding. Other: The mastoid air cells are well aerated. IMPRESSION: IMPRESSION No acute intracranial process. Electronically Signed   By: Merilyn Baba M.D.   On: 11/21/2021 12:28  ? ?MR ANGIO HEAD WO CONTRAST ? ?Result Date: 11/22/2021 ?CLINICAL DATA:  Stroke/TIA EXAM: MRI HEAD WITHOUT AND WITH CONTRAST MRA HEAD WITHOUT CONTRAST MRA NECK WITHOUT CONTRAST TECHNIQUE: Multiplanar, multiecho pulse sequences of the brain and surrounding structures were obtained without and with intravenous contrast. Angiographic images of the Circle of Willis were obtained using MRA technique without intravenous contrast. Angiographic images of the neck were obtained using MRA technique without intravenous contrast. Carotid stenosis  measurements (when applicable) are obtained utilizing NASCET criteria, using the distal internal carotid diameter as the denominator. COMPARISON:  Noncontrast CT head dated 1 day prior, 08/18/2021 FINDINGS: Image quality is intermittently motion degraded. MRI HEAD FINDINGS Brain: There is no evidence of acute intracranial hemorrhage, extra-axial fluid collection, or acute infarct. There is T2/FLAIR signal abnormality with associated susceptibility artifact in  the right cerebellar hemisphere corresponding to the calcification seen on prior head CT. There is apparent enhancement within this area of signal abnormality measuring approximately 1.3 cm x 0.6 cm (18-18).

## 2021-11-22 NOTE — ED Notes (Signed)
Admitting MD at bedside.

## 2021-11-22 NOTE — Progress Notes (Addendum)
STROKE TEAM PROGRESS NOTE  ? ?INTERVAL HISTORY ?The patient is seen in her room this morning with no family at bedside. She reports that she was speaking with her daughter on 4/2 when she experienced 1 minute of global aphasia associated with right arm heaviness/tingling/numbness.  Symptoms quickly resolved and there has been no recurrence while she has been hospitalized. CTH was initially negative for acute anomaly. MRI is currently pending.  Denies missing any doses of Xarelto, which she takes for A-fib.  She does report taking the Xarelto at different times during the day and states that she does not take it with food. ?MRI scan of the brain shows no acute infarct but shows dystrophic calcification in the right cerebellum with enhancement of unclear significance.  No definite metastasis is noted.  No ?Vitals:  ? 11/22/21 0300 11/22/21 0400 11/22/21 0700 11/22/21 0737  ?BP: (!) 179/90 (!) 182/84 (!) 170/74   ?Pulse: 63 68 (!) 59   ?Resp: 20 (!) 24 19   ?Temp:      ?TempSrc:      ?SpO2: 97% 95% 98% 98%  ?Weight:      ?Height:      ? ?CBC:  ?Recent Labs  ?Lab 11/21/21 ?1215 11/22/21 ?0428  ?WBC 10.4 11.0*  ?NEUTROABS 6.6  --   ?HGB 10.8* 10.6*  ?HCT 34.2* 32.9*  ?MCV 92.2 90.4  ?PLT 301 291  ? ?Basic Metabolic Panel:  ?Recent Labs  ?Lab 11/21/21 ?1215 11/22/21 ?0428  ?NA 139 137  ?K 3.7 3.8  ?CL 106 107  ?CO2 26 23  ?GLUCOSE 115* 113*  ?BUN 14 9  ?CREATININE 0.68 0.56  ?CALCIUM 9.0 9.0  ?MG 1.7  --   ? ?Lipid Panel: No results for input(s): CHOL, TRIG, HDL, CHOLHDL, VLDL, LDLCALC in the last 168 hours. ?HgbA1c: No results for input(s): HGBA1C in the last 168 hours. ?Urine Drug Screen: No results for input(s): LABOPIA, COCAINSCRNUR, LABBENZ, AMPHETMU, THCU, LABBARB in the last 168 hours.  ?Alcohol Level No results for input(s): ETH in the last 168 hours. ? ?IMAGING past 24 hours ?CT HEAD WO CONTRAST ? ?Result Date: 11/21/2021 ?CLINICAL DATA:  Transient ischemic attack EXAM: CT HEAD WITHOUT CONTRAST TECHNIQUE:  Contiguous axial images were obtained from the base of the skull through the vertex without intravenous contrast. RADIATION DOSE REDUCTION: This exam was performed according to the departmental dose-optimization program which includes automated exposure control, adjustment of the mA and/or kV according to patient size and/or use of iterative reconstruction technique. COMPARISON:  08/18/2021 FINDINGS: Brain: No evidence of acute infarction, hemorrhage, cerebral edema, mass, mass effect, or midline shift. No hydrocephalus or extra-axial fluid collection. Periventricular white matter changes, likely the sequela of chronic small vessel ischemic disease. 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 Vascular: No hyperdense vessel. Atherosclerotic calcifications in the intracranial carotid and vertebral arteries. Skull: Normal. Negative for fracture or focal lesion. Sinuses/Orbits: No acute finding. Other: The mastoid air cells are well aerated. IMPRESSION: IMPRESSION No acute intracranial process. Electronically Signed   By: Merilyn Baba M.D.   On: 11/21/2021 12:28   ? ?PHYSICAL EXAM ? ?Physical Exam  ?Constitutional: Appears well-developed and well-nourished.  Pleasant frail elderly Caucasian lady ?Cardiovascular: Normal rate and irregular rhythm.  ?Respiratory: Effort normal, non-labored breathing ? ?Neuro: ?Mental Status: ?Patient is awake, fully alert and oriented ?Patient is able to give a clear and coherent history. ?No signs of aphasia or neglect ?Cranial Nerves: ?II: Visual Fields are full. Pupils are equal,  round, and reactive to light.   ?III,IV, VI: EOMI without ptosis or diploplia.  ?V: Facial sensation is symmetric to temperature ?VII: Facial movement is symmetric resting and smiling ?VIII: Hearing is intact to voice ?X: Palate elevates symmetrically ?XI: Shoulder shrug is symmetric. ?XII: Tongue protrudes midline without atrophy or fasciculations.   ?Motor: ?Tone is normal. Bulk is normal. 5/5 strength was present in all four extremities.  ?Sensory: ?Sensation is symmetric to light touch and temperature in the arms and legs. ?Cerebellar: ?FNF is intact bilaterally ? ? ? ?ASSESSMENT/PLAN ?Monica Murillo is a 88 y.o. female with history of complete heart block s/p pacemaker, paroxysmal atrial fibrillation on Xarelto, CAD with stents, ILD on 2L [place] presenting with 1 minute of difficulty speaking followed by garbled speech. Hx also notable for bronchogenic carcinoma on PET scan 11/10/21.  ? ?TIA likely in left MCA distribution, causing transient aphasia and R sided weakness and numbness ?Code Stroke CT head No acute abnormality. ?MRI acute infarct.  Enhancement in the calcific right cerebellar lesion of unclear significance.  No clear brain metastasis noted moderate to ?MRA severe right P2 stenosis.  No significant stenosis in the neck ?2D Echo EF is 55-60%, LV normal function, right and left atrial size is normal ?LDL 76 ?HgbA1c 6.7 ?VTE prophylaxis - on Xarelto ?Xarelto (rivaroxaban) daily prior to admission, now on Xarelto (rivaroxaban) daily.  ?Therapy recommendations:  PT recs pending, OT recommends HHOT ?Disposition:  Home ? ?Paroxysmal atrial fibrillation ?Home med: Xarelto ? ?Hypertension ?Home meds:  metoprolol succinate ?Stable ?Permissive hypertension (OK if < 220/120) but gradually normalize in 5-7 days ?Long-term BP goal normotensive ? ?Hyperlipidemia ?Home meds: None ?LDL 76, goal < 70 ?Add Crestor 10 mg daily  ?Continue statin at discharge ? ?Glucose control ?HgbA1c 6.7, goal < 7.0 ?CBGs ?Recent Labs  ?  11/21/21 ?1215  ?GLUCAP 107*  ? ? ?Other Stroke Risk Factors ?Advanced Age >/= 42  ?Quit smoking in 2002 ?Coronary artery disease ?Stents ? ?Other Active Problems ?GERD ?Nexium 20 mg ?Hypothyroidism ?Synthroid ?Bronchial carcinoma ? ?Hospital day # 0 ? ?I have personally obtained history,examined this patient, reviewed notes, independently  viewed imaging studies, participated in medical decision making and plan of care.ROS completed by me personally and pertinent positives fully documented  I have made any additions or clarifications directly to the above note. Agree with note above.  Patient presented with transient expressive aphasia likely left hemispheric TIA.  Brain imaging is mostly negative except for slight enhancing lesion in the calcified right cerebellar old infarct.  Given recent diagnosis of lung cancer follow-up imaging to rule out metastasis is necessary in 2 to 3 months.  Continue Xarelto for stroke prevention but patient counseled to take it consistently at the same time every day with the proper meal..  Aggressive risk factor modification.  Follow-up with oncology and pulmonology for her recent diagnosis of lung cancer.  Discussed with Dr. [place] Pink.  Greater than 50% time during this 50-minute visit was spent on counseling and coordination of care about her TIA and counseling about takings Xarelto with a meal consistently at the same time every day. ? ?Antony Contras, MD ?Medical Director ?[facility] ?Pager: [phone] ?11/22/2021 5:50 PM ? ?To contact Stroke Continuity provider, please refer to [email or web address] ?After hours, contact General Neurology ? ?

## 2021-11-22 NOTE — Progress Notes (Signed)
Per order, "Interrogation from today reviewed by rep. OK to follow their recommendation of AOO 75 for MRI.  ? ?Tachy-therapies to off if applicable.  ? ?Program device back to pre-MRI settings after completion of exam" ?

## 2021-11-22 NOTE — Evaluation (Signed)
Physical Therapy Evaluation ?Patient Details ?Name: Monica Murillo ?MRN: [id number] ?DOB: [birth year] ?Today's Date: 11/22/2021 ? ?History of Present Illness ? Pt is a 88 y/o female presenting on 11/21/21 after difficulty talking temporarily before resolving. CT negative, MRI pending. Found with TIA. PMH includes: anemia, HTN, pulmonary fibrosis, DM2, IM nail R hip 12/22, PPM.  ?Clinical Impression ? Pt admitted secondary to problem above with deficits below. Requiring min guard A for mobility tasks using RW. Pt reports L hip pain at baseline. Reports family can assist as needed. Recommend continuing HHPT at d/c. Will continue to follow acutely.    ?   ? ?Recommendations for follow up therapy are one component of a multi-disciplinary discharge planning process, led by the attending physician.  Recommendations may be updated based on patient status, additional functional criteria and insurance authorization. ? ?Follow Up Recommendations Home health PT ? ?  ?Assistance Recommended at Discharge Intermittent Supervision/Assistance  ?Patient can return home with the following ? Help with stairs or ramp for entrance;Assist for transportation;Assistance with cooking/housework ? ?  ?Equipment Recommendations None recommended by PT  ?Recommendations for Other Services ?    ?  ?Functional Status Assessment Patient has had a recent decline in their functional status and demonstrates the ability to make significant improvements in function in a reasonable and predictable amount of time.  ? ?  ?Precautions / Restrictions Precautions ?Precautions: Fall ?Restrictions ?Weight Bearing Restrictions: No  ? ?  ? ?Mobility ? Bed Mobility ?Overal bed mobility: Needs Assistance ?Bed Mobility: Supine to Sit, Sit to Supine ?  ?  ?Supine to sit: Min guard, HOB elevated ?Sit to supine: Min assist ?  ?General bed mobility comments: min guard for safety to come to sitting, min assist to bring BLEs back to supine ?  ? ?Transfers ?Overall transfer  level: Needs assistance ?Equipment used: Rolling walker (2 wheels) ?Transfers: Sit to/from Stand ?Sit to Stand: Min guard ?  ?  ?  ?  ?  ?General transfer comment: Min guard for safety ?  ? ?Ambulation/Gait ?Ambulation/Gait assistance: Min guard ?Gait Distance (Feet): 20 Feet ?Assistive device: Rolling walker (2 wheels) ?Gait Pattern/deviations: Step-through pattern, Decreased stride length ?Gait velocity: Decreased ?  ?  ?General Gait Details: Flexed posture. Min guard for safety to ambulate to bathroom and back ? ?Stairs ?  ?  ?  ?  ?  ? ?Wheelchair Mobility ?  ? ?Modified Rankin (Stroke Patients Only) ?  ? ?  ? ?Balance Overall balance assessment: Needs assistance ?Sitting-balance support: No upper extremity supported, Feet supported ?Sitting balance-Leahy Scale: Fair ?  ?  ?Standing balance support: Bilateral upper extremity supported, During functional activity ?Standing balance-Leahy Scale: Poor ?Standing balance comment: relies on BUE support ?  ?  ?  ?  ?  ?  ?  ?  ?  ?  ?  ?   ? ? ? ?Pertinent Vitals/Pain Pain Assessment ?Pain Location: L hip ?Pain Descriptors / Indicators: Discomfort  ? ? ?Home Living Family/patient expects to be discharged to:: Private residence ?Living Arrangements: Alone ?Available Help at Discharge: Family;Personal care attendant ?Type of Home: Independent living facility (Apartment) ?Home Access: Elevator ?  ?  ?  ?Home Layout: One level ?Home Equipment: Conservation officer, nature (2 wheels);Shower seat;BSC/3in1;Grab bars - toilet;Grab bars - tub/shower ?Additional Comments: has aides in am/pm, hours depend  ?  ?Prior Function Prior Level of Function : Needs assist ?  ?  ?  ?  ?  ?  ?Mobility Comments: uses RW  for mobility. WC for longer distances ?ADLs Comments: has been completing ADLs without assist but has help from aides if needed, does not complete IADLs ?  ? ? ?Hand Dominance  ? Dominant Hand: Right ? ?  ?Extremity/Trunk Assessment  ? Upper Extremity Assessment ?Upper Extremity Assessment:  Defer to OT evaluation ?  ? ?Lower Extremity Assessment ?Lower Extremity Assessment: LLE deficits/detail ?LLE Deficits / Details: hx of L hip fx ?  ? ?   ?Communication  ? Communication: No difficulties  ?Cognition Arousal/Alertness: Awake/alert ?Behavior During Therapy: [facility] for tasks assessed/performed ?Overall Cognitive Status: History of cognitive impairments - at baseline ?  ?  ?  ?  ?  ?  ?  ?  ?  ?  ?  ?  ?  ?  ?  ?  ?General Comments: pt with hx of memory deficit.  She presents with good awareness, safety and problem sovling. ?  ?  ? ?  ?General Comments   ? ?  ?Exercises    ? ?Assessment/Plan  ?  ?PT Assessment Patient needs continued PT services  ?PT Problem List Decreased strength;Decreased activity tolerance;Decreased balance;Decreased mobility;Decreased knowledge of use of DME;Decreased knowledge of precautions ? ?   ?  ?PT Treatment Interventions DME instruction;Gait training;Functional mobility training;Therapeutic activities;Therapeutic exercise;Balance training;Patient/family education   ? ?PT Goals (Current goals can be found in the Care Plan section)  ?Acute Rehab PT Goals ?Patient Stated Goal: to go home ?PT Goal Formulation: With patient ?Time For Goal Achievement: 12/06/21 ?Potential to Achieve Goals: Good ? ?  ?Frequency Min 3X/week ?  ? ? ?Co-evaluation   ?  ?  ?  ?  ? ? ?  ?AM-PAC PT "6 Clicks" Mobility  ?Outcome Measure Help needed turning from your back to your side while in a flat bed without using bedrails?: None ?Help needed moving from lying on your back to sitting on the side of a flat bed without using bedrails?: A Little ?Help needed moving to and from a bed to a chair (including a wheelchair)?: A Little ?Help needed standing up from a chair using your arms (e.g., wheelchair or bedside chair)?: A Little ?Help needed to walk in hospital room?: A Little ?Help needed climbing 3-5 steps with a railing? : A Lot ?6 Click Score: 18 ? ?  ?End of Session   ?Activity Tolerance: Patient  tolerated treatment well ?Patient left: in bed;with call bell/phone within reach;with family/visitor present ?Nurse Communication: Mobility status ?PT Visit Diagnosis: Unsteadiness on feet (R26.81);Muscle weakness (generalized) (M62.81) ?  ? ?Time: 0160-1093 ?PT Time Calculation (min) (ACUTE ONLY): 16 min ? ? ?Charges:   PT Evaluation ?$PT Eval Low Complexity: 1 Low ?  ?  ?   ? ? ?Reuel Derby, PT, DPT  ?Acute Rehabilitation Services  ?Pager: [phone] ?Office: [phone] ? ? ?[facility] ?11/22/2021, 4:57 PM ? ?

## 2021-11-22 NOTE — Progress Notes (Addendum)
? ?[facility] ?PROGRESS NOTE ? ? ?Monica Murillo NTZ:[id number] DOB: [birth year] DOA: 11/21/2021  0 ?DOS: the patient was seen and examined on 11/22/2021 ? ?PCP: Yvonna Alanis, NP ? ?Brief History and Hospital Course:  ?88 y.o. female with medical history significant of hypertension, CAD s/p stent, paroxysmal atrial fibrillation, complete heart block s/p PM, diabetes mellitus type 2, chronic respiratory failure with hypoxia on 2 L, pulmonary fibrosis, left-sided breast cancer, former smoker, and hypothyroidism presented after being unable to speak briefly on the morning of admission.  Patient did not have any weakness in any 1 side of her body.  She recently established with Dr. Curt Bears with electrophysiology.  She recently moved here from [place].  Under evaluation also for lung cancer with metastases.  Has not been started yet on treatment. ? ?Consultants: Neurology ? ?Procedures: Transthoracic echocardiogram ? ? ? ?Subjective: ?Patient mentions that her aphasia has resolved.  Denies any weakness in any 1 side of the body.  She is somewhat repetitive with her questions and answers. ? ? ? ?Assessment/Plan: ? ? ?* Aphasia secondary to suspected TIA (transient ischemic attack) ?Patient presented with transient aphasia.  He did not have any hemiplegia.  Symptoms resolved within a few minutes. ?Concern is for TIA.  Neurology is following.  She has a pacemaker and this was placed in a different state.  Placed in 2018.  Not sure if this is MRI compliant. ?Await further neurological input.   ?Echocardiogram ordered by neurology.  PT OT evaluation is pending.   ?Patient is on Xarelto prior to admission.  It looks like patient was started back on Xarelto yesterday but discontinued this morning.  Defer to neurology.   ?Lipid panel and HbA1c pending. ? ?Lung mass ?Followed by Dr. Erin Fulling with pulmonology.  Underwent PET scan recently which raised concern for bronchogenic carcinoma with metastases.  CT scan of the head did  not show any brain lesions.   ?Patient is supposed to follow-up with Dr. Erin Fulling this week to discuss further course of action.  ?Based on previous notes it appears that patient is not sure if she wants to proceed with biopsy. ? ?Normocytic anemia ?It appears the patient's hemoglobin has been slowly trending downwards.  Stable this morning.  No overt bleeding.  She is on anticoagulation.   ?Will need close monitoring in the outpatient setting. ? ?PAF (paroxysmal atrial fibrillation) on chronic anticoagulation (HCC) Complete heart s/p pacemaker ?Patient appears to be in sinus rhythm at this time.  She has a history of paroxysmal atrial fibrillation and complete heart block status post pacemaker.  ?Metoprolol on hold for permissive hypertension.  Will resume since this appears to be a TIA. ?Continue Xarelto. ? ?Complete heart block ([place]) ?Status post pacemaker.  This was apparently placed in 2018 while she was in [place].  Established with electrophysiology here in town, Dr. Curt Bears. ? ?Essential hypertension ?Permissive hypertension lower.  Okay to resume metoprolol since it is for atrial fibrillation. ? ?Hypothyroidism ?Home medication regimen includes levothyroxine 125 mcg daily.  TSH was 0.062.  Levothyroxine dose was decreased to 100 mcg daily. ?Repeat TSH in 4 to 6 weeks ? ?Memory impairment ?Patient admits that her short-term memory is impaired some and her also makes note of this. ?-Delirium precautions ? ?Pulmonary fibrosis ([place]) ?Apparently on home oxygen at 2 L/min. ? ? ?DVT Prophylaxis: SCD ?Code Status: DNR ?Family Communication: No family at bedside.  Discussed with patient ?Disposition Plan: To be determined ? ? ? ? ?Medications: Scheduled: ?  levothyroxine  100 mcg Oral Q0600  ? pantoprazole  40 mg Oral Daily  ? ?Continuous: ?UKG:[id number] **OR** acetaminophen (TYLENOL) oral liquid 160 mg/5 mL **OR** acetaminophen, docusate sodium ? ?Antibiotics: ?Anti-infectives (From admission, onward)  ? ? None   ? ?  ? ? ?Objective: ? ?Vital Signs ? ?Vitals:  ? 11/22/21 0300 11/22/21 0400 11/22/21 0700 11/22/21 0737  ?BP: (!) 179/90 (!) 182/84 (!) 170/74   ?Pulse: 63 68 (!) 59   ?Resp: 20 (!) 24 19   ?Temp:      ?TempSrc:      ?SpO2: 97% 95% 98% 98%  ?Weight:      ?Height:      ? ?No intake or output data in the 24 hours ending 11/22/21 0834 ?Filed Weights  ? 11/21/21 1119  ?Weight: 50.5 kg  ? ? ?General appearance: Awake alert.  In no distress.  Mildly distracted ?Resp: Clear to auscultation bilaterally.  Normal effort ?Cardio: S1-S2 is normal regular.  No S3-S4.  No rubs murmurs or bruit ?GI: Abdomen is soft.  Nontender nondistended.  Bowel sounds are present normal.  No masses organomegaly ?Extremities: No edema.  Limited range of motion of the right lower extremity due to recent hip fracture ?Neurologic:  No focal neurological deficits.  ? ? ?Lab Results: ? ?Data Reviewed: I have personally reviewed labs and imaging study reports ? ?CBC: ?Recent Labs  ?Lab 11/21/21 ?1215 11/22/21 ?0428  ?WBC 10.4 11.0*  ?NEUTROABS 6.6  --   ?HGB 10.8* 10.6*  ?HCT 34.2* 32.9*  ?MCV 92.2 90.4  ?PLT 301 291  ? ? ?Basic Metabolic Panel: ?Recent Labs  ?Lab 11/21/21 ?1215 11/22/21 ?0428  ?NA 139 137  ?K 3.7 3.8  ?CL 106 107  ?CO2 26 23  ?GLUCOSE 115* 113*  ?BUN 14 9  ?CREATININE 0.68 0.56  ?CALCIUM 9.0 9.0  ?MG 1.7  --   ? ? ?GFR: ?Estimated Creatinine Clearance: 36.3 mL/min (by C-G formula based on SCr of 0.56 mg/dL). ? ?Liver Function Tests: ?Recent Labs  ?Lab 11/21/21 ?1215  ?AST 15  ?ALT 12  ?ALKPHOS 85  ?BILITOT 0.5  ?PROT 6.1*  ?ALBUMIN 3.5  ? ? ? ?Coagulation Profile: ?Recent Labs  ?Lab 11/21/21 ?1215  ?INR 2.1*  ? ? ?CBG: ?Recent Labs  ?Lab 11/21/21 ?1215  ?GLUCAP 107*  ? ? ? ?Thyroid Function Tests: ?Recent Labs  ?  11/21/21 ?1215  ?TSH 0.062*  ? ? ? ?Radiology Studies: ?CT HEAD WO CONTRAST ? ?Result Date: 11/21/2021 ?CLINICAL DATA:  Transient ischemic attack EXAM: CT HEAD WITHOUT CONTRAST TECHNIQUE: Contiguous axial images were  obtained from the base of the skull through the vertex without intravenous contrast. RADIATION DOSE REDUCTION: This exam was performed according to the departmental dose-optimization program which includes automated exposure control, adjustment of the mA and/or kV according to patient size and/or use of iterative reconstruction technique. COMPARISON:  08/18/2021 FINDINGS: Brain: No evidence of acute infarction, hemorrhage, cerebral edema, mass, mass effect, or midline shift. No hydrocephalus or extra-axial fluid collection. Periventricular white matter changes, likely the sequela of chronic small vessel ischemic disease. 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 Vascular: No hyperdense vessel. Atherosclerotic calcifications in the intracranial carotid and vertebral arteries. Skull: Normal. Negative for fracture or focal lesion. Sinuses/Orbits: No acute finding. Other: The mastoid air cells are well aerated. IMPRESSION: IMPRESSION No acute intracranial process. Electronically Signed   By: Merilyn Baba M.D.   On: 11/21/2021 12:28   ? ? ? ? LOS:  0 days  ? ?Bonnielee Haff ? ?[facility] ?Pager on [email or web address] ? ?11/22/2021, 8:34 AM ? ? ?

## 2021-11-22 NOTE — Progress Notes (Signed)
OT Cancellation Note ? ?Patient Details ?Name: Monica Murillo ?MRN: [id number] ?DOB: August 06, 1933 ? ? ?Cancelled Treatment:    Reason Eval/Treat Not Completed: Patient at procedure or test/ unavailable. EEG. Will follow and see as able.  ? ?Jolaine Artist, OT ?Acute Rehabilitation Services ?Pager [phone] ?Office [phone] ? ? ?Delight Stare ?11/22/2021, 9:52 AM ?

## 2021-11-22 NOTE — ED Notes (Signed)
EEG at bedside.

## 2021-11-22 NOTE — Progress Notes (Signed)
PT Cancellation Note ? ?Patient Details ?Name: CHERIDAN KIBLER ?MRN: [id number] ?DOB: 1933/05/30 ? ? ?Cancelled Treatment:    Reason Eval/Treat Not Completed: Patient at procedure or test/unavailable. PT will follow up as schedule allows. ? ?Jonne Ply, SPT ? ?Jonne Ply ?11/22/2021, 9:43 AM ? ? ?

## 2021-11-22 NOTE — ED Notes (Signed)
Pt transported to MRI 

## 2021-11-22 NOTE — TOC Transition Note (Signed)
Transition of Care (TOC) - CM/SW Discharge Note ? ? ?Patient Details  ?Name: Monica Murillo ?MRN: [id number] ?Date of Birth: Dec 03, 1932 ? ?Transition of Care (TOC) CM/SW Contact:  ?Benard Halsted, LCSW ?Phone Number: ?11/22/2021, 4:40 PM ? ? ?Clinical Narrative:    ?CSW confirmed patient is active with [facility] and will resume services. No other needs identified at this time.  ? ? ?Final next level of care: [place] ?Barriers to Discharge: No Barriers Identified ? ? ?Patient Goals and CMS Choice ?  ?CMS Medicare.gov Compare Post Acute Care list provided to:: Patient ?Choice offered to / list presented to : Patient ? ?Discharge Placement ?  ?           ?  ?  ?  ?Patient and family notified of of transfer: 11/22/21 ? ?Discharge Plan and Services ?  ?  ?           ?  ?  ?  ?  ?  ?  ?  ?  ?  ?  ? ?Social Determinants of Health (SDOH) Interventions ?  ? ? ?Readmission Risk Interventions ?   ? View : No data to display.  ?  ?  ?  ? ? ? ? ? ?

## 2021-11-22 NOTE — Assessment & Plan Note (Signed)
Status post pacemaker.  This was apparently placed in 2018 while she was in [place].  Established with electrophysiology here in town, Dr. Curt Bears. ?

## 2021-11-22 NOTE — Procedures (Signed)
Patient Name: Monica Murillo  ?MRN: [id number]  ?Epilepsy Attending: Lora Havens  ?Referring Physician/Provider: Derek Jack, MD ?Date: 11/22/2021 ?Duration: 23.03 mins ? ?Patient history: 88 year old female with transient speech abnormality.  EEG to evaluate for seizure. ? ?Level of alertness: Awake, asleep ? ?AEDs during EEG study: None ? ?Technical aspects: This EEG study was done with scalp electrodes positioned according to the 10-20 International system of electrode placement. Electrical activity was acquired at a sampling rate of 500Hz  and reviewed with a high frequency filter of 70Hz  and a low frequency filter of 1Hz . EEG data were recorded continuously and digitally stored.  ? ?Description: The posterior dominant rhythm consists of 8 Hz activity of moderate voltage (25-35 uV) seen predominantly in posterior head regions, symmetric and reactive to eye opening and eye closing. Sleep was characterized by vertex waves, sleep spindles (12 to 14 Hz), maximal frontocentral region. Hyperventilation and photic stimulation were not performed.    ? ?IMPRESSION: ?This study is within normal limits. No seizures or epileptiform discharges were seen throughout the recording. ? ?Lora Havens  ? ?

## 2021-11-22 NOTE — ED Notes (Signed)
ED TO INPATIENT HANDOFF REPORT ? ?ED Nurse Name and Phone #: [phone] ? ?S ?Name/Age/Gender ?Monica Murillo ?88 y.o. ?female ?Room/Bed: 037C/037C ? ?Code Status ?  Code Status: DNR ? ?Home/SNF/Other ?Home ?Patient oriented to: self, place, time, and situation ?Is this baseline? Yes  ? ?Triage Complete: Triage complete  ?Chief Complaint ?Aphasia [R47.01] ? ?Triage Note ?Pt to ED via EMS from [facility]. Pt states she was on the phone with her daughter at [facility] and became unable to speak. Pt states she knew what she wanted to say but could not speak. Symptoms resolved prior to EMS arrival. Hx left sided breast cancer with lymph node removal and right sided hip surgery. Patient currently on Xarelto. Alert and oriented x 4 on arrival to ED. ? ?EMS v/s: ?146/68 ?62 HR ?20 RR ?98% 2L [place] baseline ?188 CBG ? ?20g RFA placed by EMS  ? ?Allergies ?Allergies  ?Allergen Reactions  ? Lidocaine-Epinephrine Anxiety  ?  Dizzy, disoriented, "cannot receive at the dentist"  ? Sulfa Antibiotics Nausea And Vomiting and Rash  ?  Sulfa and related  ?  ? ? ?Level of Care/Admitting Diagnosis ?ED Disposition   ? ? ED Disposition  ?Admit  ? Condition  ?--  ? Comment  ?Hospital Area: [facility] [[id number]] ? Level of Care: Telemetry Medical [104] ? May place patient in observation at [facility] or [facility] if equivalent level of care is available:: No ? Covid Evaluation: Asymptomatic - no recent exposure (last 10 days) testing not required ? Diagnosis: Aphasia Marvelous.[place].3.ICD-9-CM] ? Admitting Physician: Norval Morton [[id number]] ? Attending Physician: Norval Morton [[id number]] ?  ?  ? ?  ? ? ?B ?Medical/Surgery History ?Past Medical History:  ?Diagnosis Date  ? Anemia   ? Heart block   ? History of blood transfusion   ? History of heart artery stent   ? Hypertension   ? Hypothyroid   ? Pacemaker   ? Pulmonary fibrosis ([place])   ? Respiratory failure, chronic ([place])   ? Type 2 diabetes mellitus ([place])   ? ?Past  Surgical History:  ?Procedure Laterality Date  ? ABDOMINAL HYSTERECTOMY  1981  ? BREAST LUMPECTOMY  2011  ? CARDIAC PACEMAKER PLACEMENT  2018  ? CORONARY STENT PLACEMENT  2012  ? HIP FRACTURE SURGERY Right 08/20/2021  ? Per patients daughter  ? INTRAMEDULLARY (IM) NAIL INTERTROCHANTERIC Right 08/20/2021  ? Procedure: INTRAMEDULLARY (IM) NAIL INTERTROCHANTRIC;  Surgeon: [name], MD;  Location: [facility] [place];  Service: Orthopedics;  Laterality: Right;  ?  ? ?A ?IV Location/Drains/Wounds ?Patient Lines/Drains/Airways Status   ? ? Active Line/Drains/Airways   ? ? Name Placement date Placement time Site Days  ? Peripheral IV 11/21/21 20 G Right;Distal Forearm 11/21/21  1125  Forearm  1  ? Incision (Closed) 08/20/21 Hip Right 08/20/21  1453  -- 94  ? ?  ?  ? ?  ? ? ?Intake/Output Last 24 hours ?No intake or output data in the 24 hours ending 11/22/21 1328 ? ?Labs/Imaging ?Results for orders placed or performed during the hospital encounter of 11/21/21 (from the past 48 hour(s))  ?Urinalysis, Routine w reflex microscopic Urine, Clean Catch     Status: None  ? Collection Time: 11/21/21 11:28 AM  ?Result Value Ref Range  ? Color, Urine YELLOW YELLOW  ? APPearance CLEAR CLEAR  ? Specific Gravity, Urine 1.014 1.005 - 1.030  ? pH 6.0 5.0 - 8.0  ? Glucose, UA NEGATIVE NEGATIVE mg/dL  ?  Hgb urine dipstick NEGATIVE NEGATIVE  ? Bilirubin Urine NEGATIVE NEGATIVE  ? Ketones, ur NEGATIVE NEGATIVE mg/dL  ? Protein, ur NEGATIVE NEGATIVE mg/dL  ? Nitrite NEGATIVE NEGATIVE  ? Leukocytes,Ua NEGATIVE NEGATIVE  ?  Comment: Performed at [facility], [place] [address], [place], [place] [address]  ?Comprehensive metabolic panel     Status: Abnormal  ? Collection Time: 11/21/21 12:15 PM  ?Result Value Ref Range  ? Sodium 139 135 - 145 mmol/L  ? Potassium 3.7 3.5 - 5.1 mmol/L  ? Chloride 106 98 - 111 mmol/L  ? CO2 26 22 - 32 mmol/L  ? Glucose, Bld 115 (H) 70 - 99 mg/dL  ?  Comment: Glucose reference range applies only to samples taken after  fasting for at least 8 hours.  ? BUN 14 8 - 23 mg/dL  ? Creatinine, Ser 0.68 0.44 - 1.00 mg/dL  ? Calcium 9.0 8.9 - 10.3 mg/dL  ? Total Protein 6.1 (L) 6.5 - 8.1 g/dL  ? Albumin 3.5 3.5 - 5.0 g/dL  ? AST 15 15 - 41 U/L  ? ALT 12 0 - 44 U/L  ? Alkaline Phosphatase 85 38 - 126 U/L  ? Total Bilirubin 0.5 0.3 - 1.2 mg/dL  ? GFR, Estimated >60 >60 mL/min  ?  Comment: (NOTE) ?Calculated using the CKD-EPI Creatinine Equation (2021) ?  ? Anion gap 7 5 - 15  ?  Comment: Performed at [facility], [place] [address] [place], [place] [address]  ?Protime-INR     Status: Abnormal  ? Collection Time: 11/21/21 12:15 PM  ?Result Value Ref Range  ? Prothrombin Time 23.3 (H) 11.4 - 15.2 seconds  ? INR 2.1 (H) 0.8 - 1.2  ?  Comment: (NOTE) ?INR goal varies based on device and disease states. ?Performed at [facility], [place] [address] [place], [place] ?[address] ?  ?CBC with Differential     Status: Abnormal  ? Collection Time: 11/21/21 12:15 PM  ?Result Value Ref Range  ? WBC 10.4 4.0 - 10.5 K/uL  ? RBC 3.71 (L) 3.87 - 5.11 MIL/uL  ? Hemoglobin 10.8 (L) 12.0 - 15.0 g/dL  ? HCT 34.2 (L) 36.0 - 46.0 %  ? MCV 92.2 80.0 - 100.0 fL  ? MCH 29.1 26.0 - 34.0 pg  ? MCHC 31.6 30.0 - 36.0 g/dL  ? RDW 12.7 11.5 - 15.5 %  ? Platelets 301 150 - 400 K/uL  ? nRBC 0.0 0.0 - 0.2 %  ? Neutrophils Relative % 63 %  ? Neutro Abs 6.6 1.7 - 7.7 K/uL  ? Lymphocytes Relative 19 %  ? Lymphs Abs 2.0 0.7 - 4.0 K/uL  ? Monocytes Relative 8 %  ? Monocytes Absolute 0.8 0.1 - 1.0 K/uL  ? Eosinophils Relative 8 %  ? Eosinophils Absolute 0.8 (H) 0.0 - 0.5 K/uL  ? Basophils Relative 1 %  ? Basophils Absolute 0.1 0.0 - 0.1 K/uL  ? Immature Granulocytes 1 %  ? Abs Immature Granulocytes 0.05 0.00 - 0.07 K/uL  ?  Comment: Performed at [facility], [place] [address], [place], [place] [address]  ?Magnesium     Status: None  ? Collection Time: 11/21/21 12:15 PM  ?Result Value Ref Range  ? Magnesium 1.7 1.7 - 2.4 mg/dL  ?  Comment: Performed at [facility], [place] [address], [place], [place] [address]  ?TSH     Status: Abnormal  ? Collection Time: 11/21/21 12:15 PM  ?  Result Value Ref Range  ? TSH 0.062 (L) 0.350 - 4.500 uIU/mL  ?  Comment: Performed by a 3rd Generation assay with a functional sensitivity of <=0.01 uIU/mL. ?Performed at [facility], [place] [address], [place], [place] [address] ?  ?POC CBG, ED     Status: Abnormal  ? Collection Time: 11/21/21 12:15 PM  ?Result Value Ref Range  ? Glucose-Capillary 107 (H) 70 - 99 mg/dL  ?  Comment: Glucose reference range applies only to samples taken after fasting for at least 8 hours.  ?Type and screen [place]     Status: None  ? Collection Time: 11/22/21  4:16 AM  ?Result Value Ref Range  ? ABO/RH(D) O NEG   ? Antibody Screen NEG   ? Sample Expiration    ?  11/25/2021,2359 ?Performed at [facility], [place] [address] [place], [place] [address] ?  ?CBC     Status: Abnormal  ? Collection Time: 11/22/21  4:28 AM  ?Result Value Ref Range  ? WBC 11.0 (H) 4.0 - 10.5 K/uL  ? RBC 3.64 (L) 3.87 - 5.11 MIL/uL  ? Hemoglobin 10.6 (L) 12.0 - 15.0 g/dL  ? HCT 32.9 (L) 36.0 - 46.0 %  ? MCV 90.4 80.0 - 100.0 fL  ? MCH 29.1 26.0 - 34.0 pg  ? MCHC 32.2 30.0 - 36.0 g/dL  ? RDW 12.7 11.5 - 15.5 %  ? Platelets 291 150 - 400 K/uL  ? nRBC 0.0 0.0 - 0.2 %  ?  Comment: Performed at [facility], [place] [address], [place], [place] [address]  ?Basic metabolic panel     Status: Abnormal  ? Collection Time: 11/22/21  4:28 AM  ?Result Value Ref Range  ? Sodium 137 135 - 145 mmol/L  ? Potassium 3.8 3.5 - 5.1 mmol/L  ? Chloride 107 98 - 111 mmol/L  ? CO2 23 22 - 32 mmol/L  ? Glucose, Bld 113 (H) 70 - 99 mg/dL  ?  Comment: Glucose reference range applies only to samples taken after fasting for at least 8 hours.  ? BUN 9 8 - 23 mg/dL  ? Creatinine, Ser 0.56 0.44 - 1.00 mg/dL  ? Calcium 9.0 8.9 - 10.3 mg/dL  ? GFR, Estimated >60 >60 mL/min  ?  Comment: (NOTE) ?Calculated using the CKD-EPI Creatinine Equation (2021) ?  ?  Anion gap 7 5 - 15  ?  Comment: Performed at [facility], [place] [address] [place], [place] [address]  ? ?CT HEAD WO CONTRAST ? ?Result Date: 11/21/2021 ?CLINICAL DATA:  Transient ischemic attack E

## 2021-11-22 NOTE — Progress Notes (Signed)
PT Cancellation Note ? ?Patient Details ?Name: FENNA SEMEL ?MRN: [id number] ?DOB: [birth year] ? ? ?Cancelled Treatment:    Reason Eval/Treat Not Completed: Patient at procedure or test/unavailable Pt currently at echo. Will follow up as schedule allows.  ? ?Reuel Derby, PT, DPT  ?Acute Rehabilitation Services  ?Pager: [phone] ?Office: [phone] ? ? ? ?[place] ?11/22/2021, 11:26 AM ?

## 2021-11-22 NOTE — Assessment & Plan Note (Signed)
Apparently on home oxygen at 2 L/min. ?

## 2021-11-22 NOTE — ED Notes (Addendum)
Pt assisted to bathroom via wheelchair. Linen changed on bed.  ?

## 2021-11-22 NOTE — Progress Notes (Signed)
EEG complete - results pending 

## 2021-11-22 NOTE — Hospital Course (Signed)
88 y.o. female with medical history significant of hypertension, CAD s/p stent, paroxysmal atrial fibrillation, complete heart block s/p PM, diabetes mellitus type 2, chronic respiratory failure with hypoxia on 2 L, pulmonary fibrosis, left-sided breast cancer, former smoker, and hypothyroidism presented after being unable to speak briefly on the morning of admission.  Patient did not have any weakness in any 1 side of her body.  She recently established with Dr. Curt Bears with electrophysiology.  She recently moved here from [place].  Under evaluation also for lung cancer with metastases.  Has not been started yet on treatment. ?

## 2021-11-22 NOTE — ED Notes (Signed)
Pt transported to echo 

## 2021-11-22 NOTE — Evaluation (Signed)
Occupational Therapy Evaluation ?Patient Details ?Name: Monica Murillo ?MRN: [id number] ?DOB: 1932/09/13 ?Today's Date: 11/22/2021 ? ? ?History of Present Illness Pt is a 88 y/o female presenting on 11/21/21 after difficulty talking temporarily before resolving. CT negative, MRI pending. Found with TIA. PMH includes: anemia, HTN, pulmonary fibrosis, DM2, IM nail R hip 12/22, PPM.  ? ?Clinical Impression ?  ?PTA patient using RW for mobility, independent for ADLS (has assist from aides as needed, always has supervision for shower) and lives at [facility].   Patient currently requires min guard for transfers and mobility to/from restroom, up to min assist for ADLs and min-mod assist for bed mobility.  She is limited by R hip pain, impaired balance and decreased activity tolerance. Based on performance today, patient will benefit from continued OT services while admitted and after dc at [facility] level to optimize independence, safety and return to PLOF.  Will follow.   ? ?Recommendations for follow up therapy are one component of a multi-disciplinary discharge planning process, led by the attending physician.  Recommendations may be updated based on patient status, additional functional criteria and insurance authorization.  ? ?Follow Up Recommendations ? Home health OT  ?  ?Assistance Recommended at Discharge Intermittent Supervision/Assistance  ?Patient can return home with the following A little help with walking and/or transfers;A little help with bathing/dressing/bathroom;Assistance with cooking/housework ? ?  ?Functional Status Assessment ? Patient has had a recent decline in their functional status and demonstrates the ability to make significant improvements in function in a reasonable and predictable amount of time.  ?Equipment Recommendations ? None recommended by OT  ?  ?Recommendations for Other Services   ? ? ?  ?Precautions / Restrictions Precautions ?Precautions: Fall ?Restrictions ?Weight Bearing Restrictions: No   ? ?  ? ?Mobility Bed Mobility ?Overal bed mobility: Needs Assistance ?Bed Mobility: Supine to Sit, Sit to Supine ?  ?  ?Supine to sit: Min assist ?Sit to supine: Mod assist ?  ?General bed mobility comments: min assist to ascend from stretcher, mod assist to bring BLEs back to supine ?  ? ?Transfers ?  ?  ?  ?  ?  ?  ?  ?  ?  ?  ?  ? ?  ?Balance Overall balance assessment: Needs assistance ?Sitting-balance support: No upper extremity supported, Feet supported ?Sitting balance-Leahy Scale: Fair ?  ?  ?Standing balance support: Bilateral upper extremity supported, During functional activity ?Standing balance-Leahy Scale: Poor ?Standing balance comment: relies on BUE support ?  ?  ?  ?  ?  ?  ?  ?  ?  ?  ?  ?   ? ?ADL either performed or assessed with clinical judgement  ? ?ADL Overall ADL's : Needs assistance/impaired ?  ?  ?Grooming: Min guard;Standing ?  ?  ?  ?  ?  ?Upper Body Dressing : Set up;Sitting ?  ?Lower Body Dressing: Minimal assistance;Sit to/from stand ?Lower Body Dressing Details (indicate cue type and reason): uses reacher as needed, min assist for socks ?Toilet Transfer: Ambulation;Min guard;Rolling walker (2 wheels) ?  ?Toileting- Clothing Manipulation and Hygiene: Sit to/from stand;Minimal assistance ?  ?  ?  ?Functional mobility during ADLs: Min guard;Rolling walker (2 wheels) ?   ? ? ? ?Vision   ?Vision Assessment?: No apparent visual deficits  ?   ?Perception   ?  ?Praxis   ?  ? ?Pertinent Vitals/Pain Pain Assessment ?Pain Assessment: 0-10 ?Pain Score: 5  ?Pain Location: L hip ?Pain Descriptors / Indicators:  Discomfort ?Pain Intervention(s): Limited activity within patient's tolerance, Monitored during session, Repositioned  ? ? ? ?Hand Dominance Right ?  ?Extremity/Trunk Assessment Upper Extremity Assessment ?Upper Extremity Assessment: Generalized weakness ?  ?Lower Extremity Assessment ?Lower Extremity Assessment: Defer to PT evaluation ?  ?  ?  ?Communication Communication ?Communication: No  difficulties ?  ?Cognition Arousal/Alertness: Awake/alert ?Behavior During Therapy: [facility] for tasks assessed/performed ?Overall Cognitive Status: History of cognitive impairments - at baseline ?  ?  ?  ?  ?  ?  ?  ?  ?  ?  ?  ?  ?  ?  ?  ?  ?General Comments: pt with hx of memory deficit.  She presents with good awareness, safety and problem sovling. ?  ?  ?General Comments  VSS, on 2L at baseline ? ?  ?Exercises   ?  ?Shoulder Instructions    ? ? ?Home Living Family/patient expects to be discharged to:: Private residence ?Living Arrangements: Alone ?Available Help at Discharge: Family;Personal care attendant ?Type of Home: Independent living facility (Apartment) ?Home Access: Elevator ?  ?  ?Home Layout: One level ?  ?  ?Bathroom Shower/Tub: Walk-in shower ?  ?Bathroom Toilet: Standard ?  ?  ?Home Equipment: Conservation officer, nature (2 wheels);Shower seat;BSC/3in1;Grab bars - toilet;Grab bars - tub/shower ?  ?Additional Comments: has aides in am/pm, hours depend ?  ? ?  ?Prior Functioning/Environment Prior Level of Function : Needs assist ?  ?  ?  ?  ?  ?  ?Mobility Comments: uses RW for mobility ?ADLs Comments: has been completing ADLs without assist but has help from aides if needed, does not complete IADLs ?  ? ?  ?  ?OT Problem List: Decreased strength;Decreased activity tolerance;Impaired balance (sitting and/or standing);Pain;Decreased knowledge of use of DME or AE ?  ?   ?OT Treatment/Interventions: Self-care/ADL training;Therapeutic exercise;DME and/or AE instruction;Therapeutic activities;Patient/family education;Balance training  ?  ?OT Goals(Current goals can be found in the care plan section) Acute Rehab OT Goals ?Patient Stated Goal: home ?OT Goal Formulation: With patient ?Time For Goal Achievement: 12/06/21 ?Potential to Achieve Goals: Good  ?OT Frequency: Min 2X/week ?  ? ?Co-evaluation   ?  ?  ?  ?  ? ?  ?AM-PAC OT "6 Clicks" Daily Activity     ?Outcome Measure Help from another person eating meals?: A  Little ?Help from another person taking care of personal grooming?: A Little ?Help from another person toileting, which includes using toliet, bedpan, or urinal?: A Little ?Help from another person bathing (including washing, rinsing, drying)?: A Little ?Help from another person to put on and taking off regular upper body clothing?: A Little ?Help from another person to put on and taking off regular lower body clothing?: A Little ?6 Click Score: 18 ?  ?End of Session Equipment Utilized During Treatment: Rolling walker (2 wheels);Oxygen ?Nurse Communication: Mobility status ? ?Activity Tolerance: Patient tolerated treatment well ?Patient left: in bed ? ?OT Visit Diagnosis: Other abnormalities of gait and mobility (R26.89);Muscle weakness (generalized) (M62.81)  ?              ?Time: 1308-6578 ?OT Time Calculation (min): 28 min ?Charges:  OT General Charges ?$OT Visit: 1 Visit ?OT Evaluation ?$OT Eval Moderate Complexity: 1 Mod ? ?Jolaine Artist, OT ?Acute Rehabilitation Services ?Pager [phone] ?Office [phone] ? ? ?Delight Stare ?11/22/2021, 1:03 PM ?

## 2021-11-23 ENCOUNTER — Ambulatory Visit (INDEPENDENT_AMBULATORY_CARE_PROVIDER_SITE_OTHER): Payer: Medicare Other | Admitting: Pulmonary Disease

## 2021-11-23 ENCOUNTER — Encounter: Payer: Self-pay | Admitting: Pulmonary Disease

## 2021-11-23 VITALS — BP 124/64 | HR 83 | Ht 60.0 in | Wt 108.0 lb

## 2021-11-23 DIAGNOSIS — J849 Interstitial pulmonary disease, unspecified: Secondary | ICD-10-CM | POA: Diagnosis not present

## 2021-11-23 DIAGNOSIS — C3491 Malignant neoplasm of unspecified part of right bronchus or lung: Secondary | ICD-10-CM | POA: Diagnosis not present

## 2021-11-23 LAB — HEMOGLOBIN A1C
Hgb A1c MFr Bld: 6.3 % — ABNORMAL HIGH (ref 4.8–5.6)
Mean Plasma Glucose: 134 mg/dL

## 2021-11-23 NOTE — Patient Instructions (Signed)
We will schedule you for bronchoscopy when you get back from your trip.  ? ?Hold Xarelto 5 days prior to procedure.  ? ?Safe travels! ?

## 2021-11-23 NOTE — Progress Notes (Signed)
? ?Synopsis: Referred in December 2022 for Chronic respiratory failure by Windell Moulding, NP ? ?Subjective:  ? ?PATIENT ID: Monica Murillo GENDER: female DOB: Aug 16, 1933, MRN: [id number] ? ?HPI ? ?Chief Complaint  ?Patient presents with  ? Follow-up  ?  F/U after PET scan   ? ? ?Monica Murillo is an 88 year old woman, former smoker with chronic hypoxemic respiratory failure due to interstitial lung disease who returns to pulmonary clinic for follow up of pulmonary fibrosis. ? ?Patient had high-resolution CT chest scan 10/18/2021 which showed right lower lobe lobulated mass concerning for primary lung cancer.  She underwent PET scan on 11/09/2021 which showed increased uptake in the right lower lobe mass along with hypermetabolic subcarinal lymph node, hypermetabolic lesions of the left iliac wing, T7 vertebral body and right anterior rib. ? ?She went to the ER on 11/22/2021 for acute neurologic changes concerning for stroke and she had MRI head imaging which was negative for acute stroke findings or metastatic disease.  She was discharged home with concern for TIA. ? ?Results of the scans have been discussed with the patient and her daughter with a concern for primary metastatic lung cancer.  They have an upcoming trip to [place]/13 2/16 for a family wedding.  They would like to have a bronchoscopy upon return in order to determine a diagnosis.  They would like to know all of their potential treatment options but have also expressed the may not want aggressive measures. ? ?OV 08/10/21 ?She recently moved from [place] to [place] and is here to establish care with a new pulmonology group.  She is currently using 2 L of supplemental oxygen throughout the day and night.  Record review notes that she has history of interstitial lung disease with pulmonary fibrosis.  She is a former smoker with a 50+ pack year smoking history.  She quit in the early 2000's.  No formal PFTs or CT chest were done by her primary care  team. ? ?She has established care with Dr. Berniece Salines of cardiology and an echo has been ordered. ? ?Patient denies any chest pain, lower extremity edema, cough, wheezing or sputum production. ? ?She is accompanied by her daughter today. ? ?Past Medical History:  ?Diagnosis Date  ? Anemia   ? Heart block   ? History of blood transfusion   ? History of heart artery stent   ? Hypertension   ? Hypothyroid   ? Pacemaker   ? Pulmonary fibrosis ([place])   ? Respiratory failure, chronic ([place])   ? Type 2 diabetes mellitus ([place])   ?  ? ?Family History  ?Problem Relation Age of Onset  ? Other Mother   ?     Enlarged Heart  ? Dementia Sister   ?  ? ?Social History  ? ?Socioeconomic History  ? Marital status: Widowed  ?  Spouse name: Not on file  ? Number of children: Not on file  ? Years of education: Not on file  ? Highest education level: Not on file  ?Occupational History  ? Not on file  ?Tobacco Use  ? Smoking status: Former  ?  Packs/day: 1.00  ?  Years: 20.00  ?  Pack years: 20.00  ?  Types: Cigarettes  ?  Passive exposure: Never  ? Smokeless tobacco: Never  ?Vaping Use  ? Vaping Use: Never used  ?Substance and Sexual Activity  ? Alcohol use: Yes  ?  Comment: 2-3 times a week  ? Drug use: Never  ?  Sexual activity: Not on file  ?Other Topics Concern  ? Not on file  ?Social History Narrative  ? Diet:  ?   ? Caffeine:Coffee  ?   ? Married, if yes what year: Widow, 1959  ?   ? Do you live in a house, apartment, assisted living, condo, trailer, ect: Senior Living Apartment  ?   ? Is it one or more stories:   ?   ? How many persons live in your home? 1  ?   ? Pets: 2 Cats  ?   ? Highest level or education completed: Nursing bachelors  ?   ? Current/Past profession:  ?   ? Exercise: No                 Type and how often: Walking  ?   ?   ? Living Will: Yes  ? DNR: No  ? POA/HPOA: Yes  ?   ? Functional Status:  ? Do you have difficulty bathing or dressing yourself? Yes, sometimes dressing  ? Do you have difficulty preparing food  or eating? No   ? Do you have difficulty managing your medications? No   ? Do you have difficulty managing your finances? Yes  ? Do you have difficulty affording your medications? No   ? ?Social Determinants of Health  ? ?Financial Resource Strain: Not on file  ?Food Insecurity: Not on file  ?Transportation Needs: Not on file  ?Physical Activity: Not on file  ?Stress: Not on file  ?Social Connections: Not on file  ?Intimate Partner Violence: Not on file  ?  ? ?Allergies  ?Allergen Reactions  ? Lidocaine-Epinephrine Anxiety  ?  Dizzy, disoriented, "cannot receive at the dentist"  ? Sulfa Antibiotics Nausea And Vomiting and Rash  ?  Sulfa and related  ?  ?  ? ?Outpatient Medications Prior to Visit  ?Medication Sig Dispense Refill  ? Acetaminophen (TYLENOL 8 HOUR ARTHRITIS PAIN PO) Take 600 mg by mouth in the morning, at noon, in the evening, and at bedtime.    ? docusate sodium (COLACE) 100 MG capsule Take 100 mg by mouth daily as needed for mild constipation.    ? esomeprazole (NEXIUM) 20 MG capsule Take 1 capsule by mouth in the morning.    ? levothyroxine (SYNTHROID) 112 MCG tablet Take 1 tablet (112 mcg total) by mouth daily before breakfast. Wants the Name Brand 30 tablet 2  ? Lutein-Zeaxanthin 20-1 MG CAPS Take 1 capsule by mouth in the morning.    ? METOPROLOL SUCCINATE PO Take 25 mg by mouth daily.    ? OXYGEN Inhale into the lungs. 2 Liters    ? rivaroxaban (XARELTO) 10 MG TABS tablet Take 10 mg by mouth daily.    ? rosuvastatin (CRESTOR) 10 MG tablet Take 1 tablet (10 mg total) by mouth daily. 30 tablet 2  ? ?No facility-administered medications prior to visit.  ? ? ?Review of Systems  ?Constitutional:  Negative for chills, fever, malaise/fatigue and weight loss.  ?HENT:  Negative for congestion, sinus pain and sore throat.   ?Eyes: Negative.   ?Respiratory:  Positive for shortness of breath. Negative for cough, hemoptysis, sputum production and wheezing.   ?Cardiovascular:  Negative for chest pain,  palpitations, orthopnea, claudication and leg swelling.  ?Gastrointestinal:  Negative for abdominal pain, heartburn, nausea and vomiting.  ?Genitourinary: Negative.   ?Musculoskeletal:  Negative for joint pain and myalgias.  ?Skin:  Negative for rash.  ?Neurological:  Negative for weakness.  ?  Endo/Heme/Allergies: Negative.   ?Psychiatric/Behavioral: Negative.    ? ? ? ?Objective:  ? ?Vitals:  ? 11/23/21 1414  ?BP: 124/64  ?Pulse: 83  ?SpO2: 97%  ?Weight: 108 lb (49 kg)  ?Height: 5' (1.524 m)  ? ? ? ?Physical Exam ?Constitutional:   ?   General: She is not in acute distress. ?   Appearance: She is not ill-appearing.  ?HENT:  ?   Head: Normocephalic and atraumatic.  ?Eyes:  ?   General: No scleral icterus. ?   Conjunctiva/sclera: Conjunctivae normal.  ?Cardiovascular:  ?   Rate and Rhythm: Normal rate and regular rhythm.  ?   Pulses: Normal pulses.  ?   Heart sounds: Normal heart sounds. No murmur heard. ?Pulmonary:  ?   Effort: Pulmonary effort is normal.  ?   Breath sounds: Rales (bibasilar) present. No wheezing or rhonchi.  ?Musculoskeletal:  ?   Right lower leg: No edema.  ?   Left lower leg: No edema.  ?Skin: ?   General: Skin is warm and dry.  ?Neurological:  ?   General: No focal deficit present.  ?   Mental Status: She is alert.  ?Psychiatric:     ?   Mood and Affect: Mood normal.     ?   Behavior: Behavior normal.     ?   Thought Content: Thought content normal.     ?   Judgment: Judgment normal.  ? ? ?CBC ?   ?Component Value Date/Time  ? WBC 11.0 (H) 11/22/2021 0428  ? RBC 3.64 (L) 11/22/2021 0428  ? HGB 10.6 (L) 11/22/2021 0428  ? HCT 32.9 (L) 11/22/2021 0428  ? PLT 291 11/22/2021 0428  ? MCV 90.4 11/22/2021 0428  ? MCH 29.1 11/22/2021 0428  ? MCHC 32.2 11/22/2021 0428  ? RDW 12.7 11/22/2021 0428  ? LYMPHSABS 2.0 11/21/2021 1215  ? MONOABS 0.8 11/21/2021 1215  ? EOSABS 0.8 (H) 11/21/2021 1215  ? BASOSABS 0.1 11/21/2021 1215  ? ? ?  Latest Ref Rng & Units 11/22/2021  ?  4:28 AM 11/21/2021  ? 12:15 PM 08/22/2021   ?  2:15 AM  ?BMP  ?Glucose 70 - 99 mg/dL 113   115   127    ?BUN 8 - 23 mg/dL 9   14  C 21    ?Creatinine 0.44 - 1.00 mg/dL 0.56   0.68   0.78    ?Sodium 135 - 145 mmol/L 137   139   139    ?Potassium 3.5 - 5.1 mm

## 2021-11-25 ENCOUNTER — Other Ambulatory Visit: Payer: Medicare Other

## 2021-11-27 ENCOUNTER — Encounter: Payer: Self-pay | Admitting: Pulmonary Disease

## 2021-11-29 ENCOUNTER — Encounter: Payer: Self-pay | Admitting: *Deleted

## 2021-11-29 ENCOUNTER — Telehealth (INDEPENDENT_AMBULATORY_CARE_PROVIDER_SITE_OTHER): Payer: Medicare Other | Admitting: Orthopedic Surgery

## 2021-11-29 ENCOUNTER — Encounter: Payer: Self-pay | Admitting: Orthopedic Surgery

## 2021-11-29 ENCOUNTER — Telehealth: Payer: Self-pay | Admitting: Internal Medicine

## 2021-11-29 DIAGNOSIS — C3491 Malignant neoplasm of unspecified part of right bronchus or lung: Secondary | ICD-10-CM | POA: Diagnosis not present

## 2021-11-29 DIAGNOSIS — M549 Dorsalgia, unspecified: Secondary | ICD-10-CM

## 2021-11-29 DIAGNOSIS — R918 Other nonspecific abnormal finding of lung field: Secondary | ICD-10-CM

## 2021-11-29 MED ORDER — GABAPENTIN 100 MG PO CAPS: 200.0000 mg | ORAL_CAPSULE | Freq: Every day | ORAL | 3 refills | Status: DC

## 2021-11-29 MED ORDER — ACETAMINOPHEN 500 MG PO TABS: 1000.0000 mg | ORAL_TABLET | Freq: Three times a day (TID) | ORAL | 0 refills | Status: DC | PRN

## 2021-11-29 NOTE — Progress Notes (Signed)
Oncology Nurse Navigator Documentation ? ? ?  11/29/2021  ?  2:00 PM  ?Oncology Nurse Navigator Flowsheets  ?Abnormal Finding Date 10/20/2021  ?Diagnosis Status Additional Work Up  ?Navigator Follow Up Date: 12/07/2021  ?Navigator Follow Up Reason: New Patient Appointment  ?Navigator Location [facility]  ?Referral Date to RadOnc/MedOnc 11/29/2021  ?Navigator Encounter Type Other:  ?Barriers/Navigation Needs Coordination of Care/I received referral on Ms. Humes today. I notified new patient coordinator to call and schedule her to be seen next week with Dr. Julien Nordmann with labs   ?Interventions Coordination of Care  ?Acuity Level 2-Minimal Needs (1-2 Barriers Identified)  ?Coordination of Care Other  ?Time Spent with Patient 30  ?  ?

## 2021-11-29 NOTE — Progress Notes (Signed)
? ? ?Careteam: ?Patient Care Team: ?Yvonna Alanis, NP as PCP - General (Adult Health Nurse Practitioner) ?Berniece Salines, DO as PCP - Cardiology (Cardiology) ?Constance Haw, MD as PCP - Electrophysiology (Cardiology) ? ?Seen by: Windell Moulding, AGNP-C ? ?PLACE OF SERVICE:  ?[facility]  ?Advanced Directive information ?Does Patient Have a Medical Advance Directive?: Yes, Type of Advance Directive: Living will;Healthcare Power of [place];Out of facility DNR (pink MOST or yellow form), Does patient want to make changes to medical advance directive?: No - Patient declined ? ?Allergies  ?Allergen Reactions  ? Lidocaine-Epinephrine Anxiety  ?  Dizzy, disoriented, "cannot receive at the dentist"  ? Sulfa Antibiotics Nausea And Vomiting and Rash  ?  Sulfa and related  ?  ? ? ?Chief Complaint  ?Patient presents with  ? Acute Visit  ?  MyChart visit. Requesting different pain medication. Discuss the pain that she is having.   ? ? ? ?HPI: Patient is a 88 y.o. female seen today for acute visit via tele visit due to increased pain.  ? ?Recently diagnosed with metastatic lung cancer. Followed by pulmonary. CT chest 02/27 confirmed right lower lobe neoplasm 3.2 x 1.8 2.6 cm, chronic compression fractures to T3/T5/T8/T10 noted. Scheduled lung biopsy 12/14/2021. Since diagnosis, she has had increased pain to her upper back and upper abdomen. She has been taking Tylenol arthritis 650 mg tablets- 2 tablets 3x/daily. Pain is keeping her up at night and she is having trouble sleeping. Treatment options discussed. Not interested in narcotics due to side effects. She had a unsuccessful trial of Tramadol in past. Requesting refill of gabapentin for pain. In addition, daughter would like to schedule oncology consult, requesting referral today.  ? ? ?Review of Systems:  ?Review of Systems  ?Constitutional:  Negative for chills, fever, malaise/fatigue and weight loss.  ?HENT: Negative.    ?Eyes: Negative.   ?Respiratory:  Positive for sputum  production and shortness of breath. Negative for cough and wheezing.   ?Cardiovascular:  Negative for chest pain and leg swelling.  ?Gastrointestinal:  Positive for abdominal pain. Negative for constipation, diarrhea, heartburn and vomiting.  ?Genitourinary:  Negative for dysuria, frequency and hematuria.  ?Musculoskeletal:  Positive for back pain. Negative for falls.  ?Skin: Negative.   ?Neurological:  Positive for weakness. Negative for dizziness and headaches.  ?Psychiatric/Behavioral:  Negative for depression. The patient has insomnia. The patient is not nervous/anxious.   ? ?Past Medical History:  ?Diagnosis Date  ? Anemia   ? Heart block   ? History of blood transfusion   ? History of heart artery stent   ? Hypertension   ? Hypothyroid   ? Pacemaker   ? Pulmonary fibrosis ([place])   ? Respiratory failure, chronic ([place])   ? TIA (transient ischemic attack)   ? Per daughter  ? Type 2 diabetes mellitus ([place])   ? ?Past Surgical History:  ?Procedure Laterality Date  ? ABDOMINAL HYSTERECTOMY  1981  ? BREAST LUMPECTOMY  2011  ? CARDIAC PACEMAKER PLACEMENT  2018  ? CORONARY STENT PLACEMENT  2012  ? HIP FRACTURE SURGERY Right 08/20/2021  ? Per patients daughter  ? INTRAMEDULLARY (IM) NAIL INTERTROCHANTERIC Right 08/20/2021  ? Procedure: INTRAMEDULLARY (IM) NAIL INTERTROCHANTRIC;  Surgeon: [name], MD;  Location: [place];  Service: Orthopedics;  Laterality: Right;  ? ?Social History: ?  reports that she has quit smoking. Her smoking use included cigarettes. She has a 20.00 pack-year smoking history. She has never been exposed to tobacco smoke. She has  never used smokeless tobacco. She reports current alcohol use. She reports that she does not use drugs. ? ?Family History  ?Problem Relation Age of Onset  ? Other Mother   ?     Enlarged Heart  ? Dementia Sister   ? ? ?Medications: ?Patient's Medications  ?New Prescriptions  ? No medications on file  ?Previous Medications  ? ACETAMINOPHEN (TYLENOL 8 HOUR ARTHRITIS PAIN  PO)    Take 600 mg by mouth in the morning, at noon, in the evening, and at bedtime.  ? DOCUSATE SODIUM (COLACE) 100 MG CAPSULE    Take 100 mg by mouth daily as needed for mild constipation.  ? ESOMEPRAZOLE (NEXIUM) 20 MG CAPSULE    Take 1 capsule by mouth in the morning.  ? LEVOTHYROXINE (SYNTHROID) 112 MCG TABLET    Take 1 tablet (112 mcg total) by mouth daily before breakfast. Wants the Name Brand  ? LUTEIN-ZEAXANTHIN 20-1 MG CAPS    Take 1 capsule by mouth in the morning.  ? METOPROLOL SUCCINATE PO    Take 25 mg by mouth daily.  ? OXYGEN    Inhale into the lungs. 2 Liters  ? RIVAROXABAN (XARELTO) 10 MG TABS TABLET    Take 10 mg by mouth daily.  ? ROSUVASTATIN (CRESTOR) 10 MG TABLET    Take 1 tablet (10 mg total) by mouth daily.  ?Modified Medications  ? No medications on file  ?Discontinued Medications  ? No medications on file  ? ? ?Physical Exam: ? ?There were no vitals filed for this visit. ?There is no height or weight on file to calculate BMI. ?Wt Readings from Last 3 Encounters:  ?11/23/21 108 lb (49 kg)  ?11/21/21 111 lb 5.3 oz (50.5 kg)  ?10/25/21 111 lb 6.4 oz (50.5 kg)  ? ? ?Physical Exam ?Constitutional:   ?   General: She is not in acute distress. ?Neurological:  ?   Mental Status: She is alert.  ?Exam limited due to tele visit ? ?Labs reviewed: ?Basic Metabolic Panel: ?Recent Labs  ?  07/29/21 ?1431 08/18/21 ?2114 08/22/21 ?0215 11/21/21 ?1215 11/22/21 ?0428  ?NA 141   < > 139 139 137  ?K 4.4   < > 3.7 3.7 3.8  ?CL 104   < > 101 106 107  ?CO2 26   < > 28 26 23   ?GLUCOSE 103   < > 127* 115* 113*  ?BUN 14   < > 21 14 9   ?CREATININE 0.76   < > 0.78 0.68 0.56  ?CALCIUM 9.7   < > 9.1 9.0 9.0  ?MG  --   --   --  1.7  --   ?TSH 1.35  --   --  0.062*  --   ? < > = values in this interval not displayed.  ? ?Liver Function Tests: ?Recent Labs  ?  07/29/21 ?1431 08/18/21 ?2114 11/21/21 ?1215  ?AST 17 28 15   ?ALT 18 21 12   ?ALKPHOS  --  82 85  ?BILITOT 0.5 1.0 0.5  ?PROT 6.9 6.1* 6.1*  ?ALBUMIN  --  3.9 3.5   ? ?No results for input(s): LIPASE, AMYLASE in the last 8760 hours. ?No results for input(s): AMMONIA in the last 8760 hours. ?CBC: ?Recent Labs  ?  07/29/21 ?1431 08/18/21 ?2114 08/20/21 ?0240 08/22/21 ?0215 11/21/21 ?1215 11/22/21 ?0428  ?WBC 7.2 12.4*   < > 10.2 10.4 11.0*  ?NEUTROABS 4,478 9.3*  --   --  6.6  --   ?HGB  13.3 13.3   < > 11.6* 10.8* 10.6*  ?HCT 40.7 43.0   < > 35.9* 34.2* 32.9*  ?MCV 88.1 94.5   < > 91.8 92.2 90.4  ?PLT 309 208   < > 251 301 291  ? < > = values in this interval not displayed.  ? ?Lipid Panel: ?Recent Labs  ?  11/21/21 ?1215  ?CHOL 168  ?HDL 46  ?[place] 76  ?TRIG 228*  ?CHOLHDL 3.7  ? ?TSH: ?Recent Labs  ?  07/29/21 ?1431 11/21/21 ?1215  ?TSH 1.35 0.062*  ? ?A1C: ?Lab Results  ?Component Value Date  ? HGBA1C 6.3 (H) 11/21/2021  ? ? ? ?Assessment/Plan ?1. Primary malignant neoplasm of right lung metastatic to other site [facility]) ?- CT chest 02/27 confirmed right lower lobe neoplasm 3.2 x 1.8 2.6 cm ?- followed by pulmonary ?- lung biopsy scheduled 12/14/2021 ?- daughter requesting to get oncology onboard ?- Amb Referral to Oncology Navigator ? ?2. Upper back pain ?- CT chest 02/27 noted chronic compression fractures to T3/T5/T8/T10 ?- suspect mets to bone ?- advised to stop Tylenol arthritis ?- Start tylenol 1000 mg po TID prn ?- will trial gabapentin for pain ?- advised to also use heating pad ?- gabapentin (NEURONTIN) 100 MG capsule; Take 2 capsules (200 mg total) by mouth at bedtime.  Dispense: 90 capsule; Refill: 3 ? ?Virtual Visit  ? ?I connected with Monica Murillo by video and verified that I am speaking with the correct person using two identifiers.  ? ?Patient:Monica Murillo ?Patient location: home ?Provider: Yvonna Alanis, NP  ?Provider location: [facility] ?   ?I discussed the limitations, risks, security and privacy concerns of performing an evaluation and management service by telephone and the availability of in person appointments. I also discussed with the  patient that there may be a patient responsible charge related to this service. The patient expressed understanding and agreed to proceed.  ? ? ?I discussed the assessment and treatment plan with the patient

## 2021-11-29 NOTE — Telephone Encounter (Signed)
Scheduled appt per 4/10 referral. Pt is aware of appt date and time. Pt is aware to arrive 15 mins prior to appt time and to bring and updated insurance card. Pt is aware of appt location.   ?

## 2021-11-29 NOTE — Progress Notes (Signed)
This service is provided via telemedicine ? ?No vital signs collected/recorded due to the encounter was a telemedicine visit.  ? ?Location of patient (ex: home, work):  Home ? ?Patient consents to a telephone visit:  Yes see telephone/video visit consent dated 09/16/21  ? ?Location of the provider (ex: office, home):  [facility]  ? ?Name of any referring provider:  Windell Moulding, NP ? ?Names of all persons participating in the telemedicine service and their role in the encounter:  Bing Matter, CMA, Amy E.Fargo, NP, patient daughter Adonis Brook.  ? ?Time spent on call:  9 min  ? ?

## 2021-11-29 NOTE — Telephone Encounter (Signed)
error 

## 2021-12-01 ENCOUNTER — Inpatient Hospital Stay (HOSPITAL_COMMUNITY)
Admission: EM | Admit: 2021-12-01 | Discharge: 2021-12-07 | DRG: 951 | Disposition: A | Discharge status: Hospice | Payer: Medicare Other | Source: Skilled Nursing Facility | Attending: Internal Medicine | Admitting: Internal Medicine

## 2021-12-01 ENCOUNTER — Other Ambulatory Visit: Payer: Self-pay

## 2021-12-01 DIAGNOSIS — G893 Neoplasm related pain (acute) (chronic): Secondary | ICD-10-CM | POA: Diagnosis present

## 2021-12-01 DIAGNOSIS — E1165 Type 2 diabetes mellitus with hyperglycemia: Secondary | ICD-10-CM | POA: Diagnosis present

## 2021-12-01 DIAGNOSIS — Z950 Presence of cardiac pacemaker: Secondary | ICD-10-CM

## 2021-12-01 DIAGNOSIS — R4701 Aphasia: Secondary | ICD-10-CM | POA: Diagnosis present

## 2021-12-01 DIAGNOSIS — Z66 Do not resuscitate: Secondary | ICD-10-CM | POA: Diagnosis present

## 2021-12-01 DIAGNOSIS — Z79899 Other long term (current) drug therapy: Secondary | ICD-10-CM

## 2021-12-01 DIAGNOSIS — G459 Transient cerebral ischemic attack, unspecified: Secondary | ICD-10-CM | POA: Diagnosis not present

## 2021-12-01 DIAGNOSIS — Z7901 Long term (current) use of anticoagulants: Secondary | ICD-10-CM

## 2021-12-01 DIAGNOSIS — Z515 Encounter for palliative care: Secondary | ICD-10-CM | POA: Diagnosis not present

## 2021-12-01 DIAGNOSIS — C7951 Secondary malignant neoplasm of bone: Secondary | ICD-10-CM | POA: Diagnosis present

## 2021-12-01 DIAGNOSIS — Z8673 Personal history of transient ischemic attack (TIA), and cerebral infarction without residual deficits: Secondary | ICD-10-CM

## 2021-12-01 DIAGNOSIS — C3490 Malignant neoplasm of unspecified part of unspecified bronchus or lung: Secondary | ICD-10-CM

## 2021-12-01 DIAGNOSIS — E039 Hypothyroidism, unspecified: Secondary | ICD-10-CM | POA: Diagnosis present

## 2021-12-01 DIAGNOSIS — Z955 Presence of coronary angioplasty implant and graft: Secondary | ICD-10-CM

## 2021-12-01 DIAGNOSIS — R54 Age-related physical debility: Secondary | ICD-10-CM | POA: Diagnosis present

## 2021-12-01 DIAGNOSIS — I2510 Atherosclerotic heart disease of native coronary artery without angina pectoris: Secondary | ICD-10-CM | POA: Diagnosis present

## 2021-12-01 DIAGNOSIS — E785 Hyperlipidemia, unspecified: Secondary | ICD-10-CM | POA: Diagnosis present

## 2021-12-01 DIAGNOSIS — I480 Paroxysmal atrial fibrillation: Secondary | ICD-10-CM | POA: Diagnosis present

## 2021-12-01 DIAGNOSIS — I442 Atrioventricular block, complete: Secondary | ICD-10-CM | POA: Diagnosis present

## 2021-12-01 DIAGNOSIS — Z87891 Personal history of nicotine dependence: Secondary | ICD-10-CM

## 2021-12-01 DIAGNOSIS — R29810 Facial weakness: Secondary | ICD-10-CM | POA: Diagnosis present

## 2021-12-01 DIAGNOSIS — J9611 Chronic respiratory failure with hypoxia: Secondary | ICD-10-CM | POA: Diagnosis present

## 2021-12-01 DIAGNOSIS — I10 Essential (primary) hypertension: Secondary | ICD-10-CM | POA: Diagnosis present

## 2021-12-01 DIAGNOSIS — C3491 Malignant neoplasm of unspecified part of right bronchus or lung: Secondary | ICD-10-CM | POA: Diagnosis present

## 2021-12-01 DIAGNOSIS — Z888 Allergy status to other drugs, medicaments and biological substances status: Secondary | ICD-10-CM

## 2021-12-01 DIAGNOSIS — Z882 Allergy status to sulfonamides status: Secondary | ICD-10-CM

## 2021-12-01 DIAGNOSIS — Z79890 Hormone replacement therapy (postmenopausal): Secondary | ICD-10-CM

## 2021-12-01 DIAGNOSIS — Z9981 Dependence on supplemental oxygen: Secondary | ICD-10-CM

## 2021-12-01 DIAGNOSIS — R627 Adult failure to thrive: Secondary | ICD-10-CM | POA: Diagnosis present

## 2021-12-01 DIAGNOSIS — J8410 Pulmonary fibrosis, unspecified: Secondary | ICD-10-CM | POA: Diagnosis present

## 2021-12-01 LAB — COMPREHENSIVE METABOLIC PANEL
ALT: 14 U/L (ref 0–44)
AST: 15 U/L (ref 15–41)
Albumin: 3.5 g/dL (ref 3.5–5.0)
Alkaline Phosphatase: 95 U/L (ref 38–126)
Anion gap: 7 (ref 5–15)
BUN: 12 mg/dL (ref 8–23)
CO2: 27 mmol/L (ref 22–32)
Calcium: 9.9 mg/dL (ref 8.9–10.3)
Chloride: 105 mmol/L (ref 98–111)
Creatinine, Ser: 0.62 mg/dL (ref 0.44–1.00)
GFR, Estimated: >60 mL/min (ref >60)
Glucose, Bld: 132 mg/dL — ABNORMAL HIGH (ref 70–99)
Potassium: 3.4 mmol/L — ABNORMAL LOW (ref 3.5–5.1)
Sodium: 139 mmol/L (ref 135–145)
Total Bilirubin: 0.5 mg/dL (ref 0.3–1.2)
Total Protein: 6.4 g/dL — ABNORMAL LOW (ref 6.5–8.1)

## 2021-12-01 LAB — DIFFERENTIAL
Abs Immature Granulocytes: 0.43 10*3/uL — ABNORMAL HIGH (ref 0.00–0.07)
Basophils Absolute: 0.1 10*3/uL (ref 0.0–0.1)
Basophils Relative: 1 %
Eosinophils Absolute: 1.6 10*3/uL — ABNORMAL HIGH (ref 0.0–0.5)
Eosinophils Relative: 8 %
Immature Granulocytes: 2 %
Lymphocytes Relative: 9 %
Lymphs Abs: 1.9 10*3/uL (ref 0.7–4.0)
Monocytes Absolute: 1.5 10*3/uL — ABNORMAL HIGH (ref 0.1–1.0)
Monocytes Relative: 7 %
Neutro Abs: 14.3 10*3/uL — ABNORMAL HIGH (ref 1.7–7.7)
Neutrophils Relative %: 73 %

## 2021-12-01 LAB — APTT: aPTT: 38 seconds — ABNORMAL HIGH (ref 24–36)

## 2021-12-01 LAB — PROTIME-INR
INR: 1.9 — ABNORMAL HIGH (ref 0.8–1.2)
Prothrombin Time: 21.3 seconds — ABNORMAL HIGH (ref 11.4–15.2)

## 2021-12-01 LAB — CBC
HCT: 37.3 % (ref 36.0–46.0)
Hemoglobin: 11.9 g/dL — ABNORMAL LOW (ref 12.0–15.0)
MCH: 28.7 pg (ref 26.0–34.0)
MCHC: 31.9 g/dL (ref 30.0–36.0)
MCV: 90.1 fL (ref 80.0–100.0)
Platelets: 331 10*3/uL (ref 150–400)
RBC: 4.14 MIL/uL (ref 3.87–5.11)
RDW: 12.9 % (ref 11.5–15.5)
WBC: 19.8 10*3/uL — ABNORMAL HIGH (ref 4.0–10.5)
nRBC: 0 % (ref 0.0–0.2)

## 2021-12-01 LAB — ETHANOL: Alcohol, Ethyl (B): <10 mg/dL (ref <10)

## 2021-12-01 MED ORDER — STROKE: EARLY STAGES OF RECOVERY BOOK: Freq: Once | Status: DC

## 2021-12-01 MED ORDER — MORPHINE SULFATE (PF) 2 MG/ML IV SOLN
2.0000 mg | INTRAVENOUS | Status: DC | PRN
  Administered 2021-12-02 – 2021-12-07 (×8): 2 mg via INTRAVENOUS
  Filled 2021-12-01 (×8): qty 1

## 2021-12-01 MED ORDER — GABAPENTIN 100 MG PO CAPS
200.0000 mg | ORAL_CAPSULE | Freq: Every day | ORAL | Status: DC
  Administered 2021-12-02 – 2021-12-06 (×5): 200 mg via ORAL
  Filled 2021-12-01 (×5): qty 2

## 2021-12-01 MED ORDER — ROSUVASTATIN CALCIUM 5 MG PO TABS
10.0000 mg | ORAL_TABLET | Freq: Every day | ORAL | Status: DC
  Administered 2021-12-02: 10 mg via ORAL
  Filled 2021-12-01: qty 2

## 2021-12-01 MED ORDER — SENNOSIDES-DOCUSATE SODIUM 8.6-50 MG PO TABS: 1.0000 | ORAL_TABLET | Freq: Every evening | ORAL | Status: DC | PRN

## 2021-12-01 MED ORDER — LEVOTHYROXINE SODIUM 25 MCG PO TABS
125.0000 ug | ORAL_TABLET | Freq: Every day | ORAL | Status: DC
  Administered 2021-12-02 – 2021-12-07 (×5): 125 ug via ORAL
  Filled 2021-12-01 (×6): qty 1

## 2021-12-01 MED ORDER — OXYCODONE HCL 5 MG PO TABS
5.0000 mg | ORAL_TABLET | ORAL | Status: DC | PRN
Start: 2021-12-01 — End: 2021-12-02
  Administered 2021-12-02: 5 mg via ORAL
  Filled 2021-12-01: qty 1

## 2021-12-01 MED ORDER — ASPIRIN 300 MG RE SUPP
300.0000 mg | Freq: Every day | RECTAL | Status: DC
  Filled 2021-12-01 (×3): qty 1

## 2021-12-01 MED ORDER — ACETAMINOPHEN 650 MG RE SUPP: 650.0000 mg | RECTAL | Status: DC | PRN

## 2021-12-01 MED ORDER — PANTOPRAZOLE SODIUM 40 MG PO TBEC
40.0000 mg | DELAYED_RELEASE_TABLET | Freq: Every day | ORAL | Status: DC
  Administered 2021-12-02: 40 mg via ORAL
  Filled 2021-12-01: qty 1

## 2021-12-01 MED ORDER — ASPIRIN 325 MG PO TABS
325.0000 mg | ORAL_TABLET | Freq: Every day | ORAL | Status: DC
  Administered 2021-12-02 – 2021-12-07 (×6): 325 mg via ORAL
  Filled 2021-12-01 (×6): qty 1

## 2021-12-01 MED ORDER — SODIUM CHLORIDE 0.9 % IV SOLN: INTRAVENOUS | Status: DC

## 2021-12-01 MED ORDER — ACETAMINOPHEN 325 MG PO TABS
650.0000 mg | ORAL_TABLET | ORAL | Status: DC | PRN
  Administered 2021-12-01 – 2021-12-02 (×2): 650 mg via ORAL
  Filled 2021-12-01 (×2): qty 2

## 2021-12-01 MED ORDER — MORPHINE SULFATE (PF) 4 MG/ML IV SOLN: 4.0000 mg | Freq: Once | INTRAVENOUS | Status: DC

## 2021-12-01 MED ORDER — RIVAROXABAN 10 MG PO TABS
10.0000 mg | ORAL_TABLET | Freq: Every day | ORAL | Status: DC
  Administered 2021-12-02 – 2021-12-07 (×6): 10 mg via ORAL
  Filled 2021-12-01 (×6): qty 1

## 2021-12-01 MED ORDER — ACETAMINOPHEN 160 MG/5ML PO SOLN: 650.0000 mg | ORAL | Status: DC | PRN

## 2021-12-01 NOTE — ED Notes (Signed)
IV morphine is for MRI that will be done tomorrow ?

## 2021-12-01 NOTE — Progress Notes (Signed)
Brief Palliative Medicine Progress Note: ? ?PMT consult received and chart reviewed. Called patient's daughter/Christie - in person meeting scheduled for tomorrow 4/13 at [place] for full [place]. ? ?Thank you for allowing PMT to assist in the care of this patient. ? ?[employer] Tamala Julian, FNP-BC ?Palliative Medicine Team ?Team Phone: [phone] ?NO CHARGE ? ?

## 2021-12-01 NOTE — ED Provider Notes (Signed)
?[facility] ?Provider Note ? ? ?CSN: [id number] ?Arrival date & time: 12/01/21  1226 ? ?  ? ?History ? ?Chief Complaint  ?Patient presents with  ? Weakness  ? Aphasia  ? ? ?Monica Murillo is a 88 y.o. female.  She is brought in by home by ambulance for evaluation of possible TIA.  She had about 20 minutes of slurred speech and right arm numbness.  This is her third episode of this in the last few weeks and she was admitted for TIA work-up, had an MRI, continues on anticoagulation with Xarelto.  She is also had a work-up by her pulmonologist for probable lung cancer with skeletal mets.  Complaining of a lot of pain in her mid back and in her pelvis.  Causes her difficulty with ambulation.  No fevers or chills.  No shortness of breath.  No current neurologic symptoms.  She is on chronic oxygen ? ?The history is provided by the patient and a relative.  ?Cerebrovascular Accident ?This is a recurrent problem. The current episode started less than 1 hour ago. The problem has been resolved. Pertinent negatives include no chest pain, no abdominal pain, no headaches and no shortness of breath. Nothing aggravates the symptoms. Nothing relieves the symptoms. She has tried nothing for the symptoms. The treatment provided significant relief.  ? ?  ? ?Home Medications ?Prior to Admission medications   ?Medication Sig Start Date End Date Taking? Authorizing Provider  ?acetaminophen (TYLENOL) 500 MG tablet Take 2 tablets (1,000 mg total) by mouth 3 (three) times daily as needed. 11/29/21   Fargo, Amy E, NP  ?docusate sodium (COLACE) 100 MG capsule Take 100 mg by mouth daily as needed for mild constipation.    [provider]  ?esomeprazole (NEXIUM) 20 MG capsule Take 1 capsule by mouth in the morning.    [provider]  ?gabapentin (NEURONTIN) 100 MG capsule Take 2 capsules (200 mg total) by mouth at bedtime. 11/29/21   Yvonna Alanis, NP  ?levothyroxine (SYNTHROID) 112 MCG  tablet Take 1 tablet (112 mcg total) by mouth daily before breakfast. Wants the Name Brand 11/22/21   Bonnielee Haff, MD  ?Lutein-Zeaxanthin 20-1 MG CAPS Take 1 capsule by mouth in the morning.    [provider]  ?METOPROLOL SUCCINATE PO Take 25 mg by mouth daily.    [provider]  ?OXYGEN Inhale into the lungs. 2 Liters    [provider]  ?rivaroxaban (XARELTO) 10 MG TABS tablet Take 10 mg by mouth daily.    [provider]  ?rosuvastatin (CRESTOR) 10 MG tablet Take 1 tablet (10 mg total) by mouth daily. 11/23/21   Bonnielee Haff, MD  ?   ? ?Allergies    ?Lidocaine-epinephrine and Sulfa antibiotics   ? ?Review of Systems   ?Review of Systems  ?Constitutional:  Negative for fever.  ?HENT:  Negative for sore throat.   ?Eyes:  Negative for visual disturbance.  ?Respiratory:  Negative for shortness of breath.   ?Cardiovascular:  Negative for chest pain.  ?Gastrointestinal:  Negative for abdominal pain.  ?Genitourinary:  Negative for dysuria.  ?Musculoskeletal:  Positive for back pain.  ?Skin:  Negative for rash.  ?Neurological:  Positive for speech difficulty and numbness. Negative for headaches.  ? ?Physical Exam ?Updated Vital Signs ?BP (!) 165/76 (BP Location: Right Arm)   Pulse 73   Temp 97.9 ?F (36.6 ?C) (Oral)   Ht 5' (1.524 m)   Wt 49 kg  SpO2 91%   BMI 21.09 kg/m?  ?Physical Exam ?Vitals and nursing note reviewed.  ?Constitutional:   ?   General: She is not in acute distress. ?   Appearance: Normal appearance. She is well-developed.  ?HENT:  ?   Head: Normocephalic and atraumatic.  ?Eyes:  ?   Conjunctiva/sclera: Conjunctivae normal.  ?Cardiovascular:  ?   Rate and Rhythm: Normal rate and regular rhythm.  ?   Heart sounds: No murmur heard. ?Pulmonary:  ?   Effort: Pulmonary effort is normal. No respiratory distress.  ?   Breath sounds: Normal breath sounds.  ?Abdominal:  ?   Palpations: Abdomen is soft.  ?   Tenderness: There is no abdominal tenderness.   ?Musculoskeletal:     ?   General: No swelling. Normal range of motion.  ?   Cervical back: Neck supple.  ?   Right lower leg: No edema.  ?   Left lower leg: No edema.  ?Skin: ?   General: Skin is warm and dry.  ?   Capillary Refill: Capillary refill takes less than 2 seconds.  ?Neurological:  ?   General: No focal deficit present.  ?   Mental Status: She is alert and oriented to person, place, and time.  ?   Cranial Nerves: No cranial nerve deficit.  ?   Sensory: No sensory deficit.  ?   Motor: No weakness.  ? ? ?ED Results / Procedures / Treatments   ?Labs ?(all labs ordered are listed, but only abnormal results are displayed) ?Labs Reviewed  ?PROTIME-INR - Abnormal; Notable for the following components:  ?    Result Value  ? Prothrombin Time 21.3 (*)   ? INR 1.9 (*)   ? All other components within normal limits  ?APTT - Abnormal; Notable for the following components:  ? aPTT 38 (*)   ? All other components within normal limits  ?CBC - Abnormal; Notable for the following components:  ? WBC 19.8 (*)   ? Hemoglobin 11.9 (*)   ? All other components within normal limits  ?DIFFERENTIAL - Abnormal; Notable for the following components:  ? Neutro Abs 14.3 (*)   ? Monocytes Absolute 1.5 (*)   ? Eosinophils Absolute 1.6 (*)   ? Abs Immature Granulocytes 0.43 (*)   ? All other components within normal limits  ?COMPREHENSIVE METABOLIC PANEL - Abnormal; Notable for the following components:  ? Potassium 3.4 (*)   ? Glucose, Bld 132 (*)   ? Total Protein 6.4 (*)   ? All other components within normal limits  ?ETHANOL  ? ? ?EKG ?EKG Interpretation ? ?Date/Time:  Wednesday December 01 2021 12:38:34 EDT ?Ventricular Rate:  72 ?PR Interval:  254 ?QRS Duration: 89 ?QT Interval:  421 ?QTC Calculation: 461 ?R Axis:   -41 ?Text Interpretation: Sinus rhythm Prolonged PR interval Left atrial enlargement Abnormal R-wave progression, early transition Left ventricular hypertrophy Inferior infarct, old Anterior Q waves, possibly due to LVH  Lateral leads are also involved Poor data quality in current ECG precludes serial comparison Confirmed by Aletta Edouard [phone]) on 12/01/2021 12:47:27 PM ? ?Radiology ?No results found. ? ?Procedures ?Procedures  ? ? ?Medications Ordered in ED ?Medications  ?rosuvastatin (CRESTOR) tablet 10 mg (has no administration in time range)  ?levothyroxine (SYNTHROID) tablet 125 mcg (has no administration in time range)  ?pantoprazole (PROTONIX) EC tablet 40 mg (has no administration in time range)  ?rivaroxaban (XARELTO) tablet 10 mg (has no administration in time range)  ?gabapentin (NEURONTIN)  capsule 200 mg (has no administration in time range)  ? stroke: early stages of recovery book (has no administration in time range)  ?0.9 %  sodium chloride infusion (has no administration in time range)  ?acetaminophen (TYLENOL) tablet 650 mg (has no administration in time range)  ?  Or  ?acetaminophen (TYLENOL) 160 MG/5ML solution 650 mg (has no administration in time range)  ?  Or  ?acetaminophen (TYLENOL) suppository 650 mg (has no administration in time range)  ?senna-docusate (Senokot-S) tablet 1 tablet (has no administration in time range)  ?aspirin suppository 300 mg (has no administration in time range)  ?  Or  ?aspirin tablet 325 mg (has no administration in time range)  ?oxyCODONE (Oxy IR/ROXICODONE) immediate release tablet 5 mg (has no administration in time range)  ?morphine (PF) 2 MG/ML injection 2 mg (has no administration in time range)  ? ? ?ED Course/ Medical Decision Making/ A&P ?Clinical Course as of 12/01/21 1758  ?Wed Dec 01, 2021  ?1331 Discussed with neurology Dr. Lorrin Goodell who is recommending the patient get an MRI with and without contrast of the brain.  Reviewed with daughter and patient.  She said she had a lot of pain in her hips during the last 1.  We will prescribe some pain medication on-call to MRI. [MB]  ?  ?Clinical Course User Index ?[MB] Hayden Rasmussen, MD  ? ?                         ?Medical Decision Making ?Amount and/or Complexity of Data Reviewed ?Labs: ordered. ?Radiology: ordered. ? ?Risk ?Decision regarding hospitalization. ? ?This patient complains of transient episode of slurred speech and right arm

## 2021-12-01 NOTE — H&P (Addendum)
?History and Physical  ? ? ?Patient: Monica Murillo DOB: Nov 02, 1932 ?DOA: 12/01/2021 ?DOS: the patient was seen and examined on 12/01/2021 ?PCP: Monica Alanis, NP  ?Patient coming from: Home - lives alone with caregiver; NOK: Daughter, Monica Murillo, [phone] ? ? ?Chief Complaint: Aphasia ? ?HPI: Monica Murillo is a 88 y.o. female with medical history significant of pacemaker placement; CAD s/p stent; HTN; hypothyroidism; afib on Xarelto; pulmonary fibrosis on home O2; metastatic lung CA; and DM presenting with stroke-like symptoms lasting for 20 minutes.  She was previously hospitalized from 4/2-3 for transient aphasia concerning for TIA; she had a full evaluation during that hospitalization.  She reports that she moved to [facility] from [place] recently.  She was previously hospitalized for an episode of aphasia while speaking on the telephone with her daughter.  She had a similar episode today with her caregiver, lasting a few minutes (her recollection) to 20 minutes (caregiver's report).  She did not have other symptoms.  She does have metastatic lung cancer that has not yet been biopsied; she is having significant pain in her sternal wall region as well as her pelvis.  Her daughter has agreed to bronch with biopsy, but the patient thinks this is fairly pointless and would prefer to die naturally without intervention.  She is ok with a repeat MRI but does acknowledge significant pain during her prior evaluation and is agreeable to morphine during the exam tomorrow AM.   ? ?I spoke with her daughter and she may have had another episode this weekend.  She had 30-45 seconds of slurred speech Saturday.  It was so fast that she didn't bring her in.  Her daughter reports severe leg weakness this AM, her legs just gave out and she felt like she was falling.  Her PT was in the building and caught her.  2 hours later, at least 10 minutes of slurred speech and another 10 minutes of recovery.  She has an  appointment with the oncology team Tuesday.  They are totally fine forgoing the biopsy.  They are open to meeting with the palliative care team - her biggest questions are pain management.  In the last 3-4 days, it has seemed unbearable and she assumes that is related to the bone cancer.  Her PCP called in Tylenol and gabapentin.  It has come on so fast and she is "declining by the hour".  She isn't sure what the MRI would accomplish at this time.  She is in agreement with palliative care and pain management.  She was in reasonable shape when she moved here 4 months ago, but she broke her hip 3 weeks later. ? ? ? ?ER Course:   Recurrent TIA symptoms.  Dr. Milas Gain recommends repeat MRI, has pacemaker and so has to be done in AM.   ? ? ? ? ?Review of Systems: As mentioned in the history of present illness. All other systems reviewed and are negative. ?Past Medical History:  ?Diagnosis Date  ? Anemia   ? Heart block   ? History of blood transfusion   ? History of heart artery stent   ? Hypertension   ? Hypothyroid   ? Pacemaker   ? Pulmonary fibrosis ([place])   ? Respiratory failure, chronic ([place])   ? TIA (transient ischemic attack)   ? Per daughter  ? Type 2 diabetes mellitus ([place])   ? ?Past Surgical History:  ?Procedure Laterality Date  ? ABDOMINAL HYSTERECTOMY  1981  ? BREAST  LUMPECTOMY  2011  ? CARDIAC PACEMAKER PLACEMENT  2018  ? CORONARY STENT PLACEMENT  2012  ? HIP FRACTURE SURGERY Right 08/20/2021  ? Per patients daughter  ? INTRAMEDULLARY (IM) NAIL INTERTROCHANTERIC Right 08/20/2021  ? Procedure: INTRAMEDULLARY (IM) NAIL INTERTROCHANTRIC;  Surgeon: [name], MD;  Location: [facility];  Service: Orthopedics;  Laterality: Right;  ? ?Social History:  reports that she has quit smoking. Her smoking use included cigarettes. She has a 20.00 pack-year smoking history. She has never been exposed to tobacco smoke. She has never used smokeless tobacco. She reports current alcohol use. She reports that she does not use  drugs. ? ?Allergies  ?Allergen Reactions  ? Lidocaine-Epinephrine Anxiety  ?  Dizzy, disoriented, "cannot receive at the dentist"  ? Sulfa Antibiotics Nausea And Vomiting and Rash  ?  Sulfa and related  ?  ? ? ?Family History  ?Problem Relation Age of Onset  ? Other Mother   ?     Enlarged Heart  ? Dementia Sister   ? ? ?Prior to Admission medications   ?Medication Sig Start Date End Date Taking? Authorizing Provider  ?acetaminophen (TYLENOL) 500 MG tablet Take 2 tablets (1,000 mg total) by mouth 3 (three) times daily as needed. ?Patient taking differently: Take 1,000 mg by mouth in the morning, at noon, and at bedtime. 11/29/21  Yes Fargo, Amy E, NP  ?docusate sodium (COLACE) 100 MG capsule Take 100 mg by mouth daily.   Yes [provider]  ?esomeprazole (NEXIUM) 20 MG capsule Take 1 capsule by mouth in the morning.   Yes [provider]  ?gabapentin (NEURONTIN) 100 MG capsule Take 2 capsules (200 mg total) by mouth at bedtime. 11/29/21  Yes Fargo, Amy E, NP  ?levothyroxine (SYNTHROID) 125 MCG tablet Take 125 mcg by mouth daily before breakfast.   Yes [provider]  ?Lutein-Zeaxanthin 20-1 MG CAPS Take 1 capsule by mouth in the morning.   Yes [provider]  ?metoprolol succinate (TOPROL-XL) 25 MG 24 hr tablet Take 25 mg by mouth daily.   Yes [provider]  ?rivaroxaban (XARELTO) 10 MG TABS tablet Take 10 mg by mouth daily.   Yes [provider]  ?rosuvastatin (CRESTOR) 10 MG tablet Take 1 tablet (10 mg total) by mouth daily. 11/23/21  Yes Bonnielee Haff, MD  ?levothyroxine (SYNTHROID) 112 MCG tablet Take 1 tablet (112 mcg total) by mouth daily before breakfast. Wants the Name Brand ?Patient not taking: Reported on 12/01/2021 11/22/21   Bonnielee Haff, MD  ?OXYGEN Inhale into the lungs. 2 Liters    [provider]  ? ? ?Physical Exam: ?Vitals:  ? 12/01/21 1300 12/01/21 1500 12/01/21 1545 12/01/21 1615  ?BP: (!) 152/70 (!) 146/67 (!) 185/67 (!) 150/84   ?Pulse: 62 60 61 61  ?Resp: 18 17 17 20   ?Temp:      ?TempSrc:      ?SpO2: 100% 100% 99% 98%  ?Weight:      ?Height:      ? ?General:  Appears calm and comfortable and is in NAD ?Eyes:  PERRL, EOMI, normal lids, iris ?ENT:  grossly normal hearing, lips & tongue, mmm ?Neck:  no LAD, masses or thyromegaly ?Cardiovascular:  RRR, 0-3/5 systolic murmur, no r/g. No LE edema.  ?Respiratory:   CTA bilaterally with no wheezes/rales/rhonchi.  Normal respiratory effort. ?Abdomen:  soft, NT, ND ?Skin:  no rash or induration seen on limited exam ?Musculoskeletal:  grossly normal tone BUE/BLE, good ROM, no bony abnormality ?Psychiatric:  grossly normal mood and affect, speech fluent and appropriate, AOx3 ?Neurologic:  CN 2-12 grossly intact, moves all extremities in coordinated fashion ? ? ?Radiological Exams on Admission: ?Independently reviewed - see discussion in A/P where applicable ? ?No results found. ? ?EKG: Independently reviewed.  NSR with rate 72; LVH; nonspecific ST changes with significant artifact ? ? ?Labs on Admission: I have personally reviewed the available labs and imaging studies at the time of the admission. ? ?Pertinent labs:   ? ?Glucose 132 ?WBC 19.8 ?INR 1.9 ?ETOH <10 ? ?Assessment and Plan: ?Principal Problem: ?  History of recurrent TIAs ?Active Problems: ?  Lung mass ?  PAF (paroxysmal atrial fibrillation) on chronic anticoagulation (HCC) Complete heart s/p pacemaker ?  Hypothyroidism ?  Essential hypertension ?  Pulmonary fibrosis ([place]) ?  Type 2 diabetes mellitus with hyperglycemia, without long-term current use of insulin ([place]) ? ?  ?Recurrent TIA ?-Patient was recently admitted with the same and has had at least 1 episode in between before today's back to back episodes ?-Initially she had leg weakness and then later today had aphasia ?-Her symptoms have resolved completely and are concerning for recurrent TIAs ?-Patient was discussed with Dr. Lorrin Goodell and she was recommended to have MRI ?-Due to  her pacemaker, MRI would have to happen tomorrow AM ?-However, in further discussion with her daughter, the patient has had a very rough few months and is rapidly deteriorating and they are considering a transiti

## 2021-12-01 NOTE — ED Provider Notes (Signed)
Patient was initially seen by Dr. Melina Copa.  Please see his note.  Patient with history of recurrent TIAs.  Discussed case with Dr. Lorrin Goodell. and the recommendation was for repeat MRI.  Notified by MRI that patient will have to have that done in the morning due to her pacemaker.  Medical service consulted for admission ? ?Case discussed with Dr Lorin Mercy ?  ?Dorie Rank, MD ?12/01/21 1623 ? ?

## 2021-12-01 NOTE — ED Triage Notes (Signed)
From assisted living. Was found sitting on toilet. Slurred speech, facial drop and r arm weakness. Facility called 911. Symptoms resolved PTA 911. Slid from wheelchair this morning and c/o pain in shoulders from holding on to the wheelchair. Does take blood thinner. No head strike. No dizziness or headache.  ?

## 2021-12-01 NOTE — ED Notes (Signed)
Daughter states that caregiver told her the episode lasted about 20 minutes. After about 10 minutes she started to get better but complete resolve at 20 minutes ? ?

## 2021-12-02 ENCOUNTER — Encounter (HOSPITAL_COMMUNITY): Payer: Self-pay | Admitting: Internal Medicine

## 2021-12-02 DIAGNOSIS — R4701 Aphasia: Secondary | ICD-10-CM | POA: Diagnosis present

## 2021-12-02 DIAGNOSIS — C3491 Malignant neoplasm of unspecified part of right bronchus or lung: Secondary | ICD-10-CM | POA: Diagnosis present

## 2021-12-02 DIAGNOSIS — C7951 Secondary malignant neoplasm of bone: Secondary | ICD-10-CM | POA: Diagnosis present

## 2021-12-02 DIAGNOSIS — Z955 Presence of coronary angioplasty implant and graft: Secondary | ICD-10-CM | POA: Diagnosis not present

## 2021-12-02 DIAGNOSIS — G893 Neoplasm related pain (acute) (chronic): Secondary | ICD-10-CM

## 2021-12-02 DIAGNOSIS — J9611 Chronic respiratory failure with hypoxia: Secondary | ICD-10-CM | POA: Diagnosis present

## 2021-12-02 DIAGNOSIS — I10 Essential (primary) hypertension: Secondary | ICD-10-CM | POA: Diagnosis present

## 2021-12-02 DIAGNOSIS — Z8673 Personal history of transient ischemic attack (TIA), and cerebral infarction without residual deficits: Secondary | ICD-10-CM | POA: Diagnosis not present

## 2021-12-02 DIAGNOSIS — Z882 Allergy status to sulfonamides status: Secondary | ICD-10-CM | POA: Diagnosis not present

## 2021-12-02 DIAGNOSIS — R29810 Facial weakness: Secondary | ICD-10-CM | POA: Diagnosis present

## 2021-12-02 DIAGNOSIS — E785 Hyperlipidemia, unspecified: Secondary | ICD-10-CM | POA: Diagnosis present

## 2021-12-02 DIAGNOSIS — Z7901 Long term (current) use of anticoagulants: Secondary | ICD-10-CM | POA: Diagnosis not present

## 2021-12-02 DIAGNOSIS — I480 Paroxysmal atrial fibrillation: Secondary | ICD-10-CM | POA: Diagnosis present

## 2021-12-02 DIAGNOSIS — I442 Atrioventricular block, complete: Secondary | ICD-10-CM | POA: Diagnosis present

## 2021-12-02 DIAGNOSIS — C3490 Malignant neoplasm of unspecified part of unspecified bronchus or lung: Secondary | ICD-10-CM | POA: Diagnosis not present

## 2021-12-02 DIAGNOSIS — Z789 Other specified health status: Secondary | ICD-10-CM

## 2021-12-02 DIAGNOSIS — E1165 Type 2 diabetes mellitus with hyperglycemia: Secondary | ICD-10-CM | POA: Diagnosis present

## 2021-12-02 DIAGNOSIS — Z87891 Personal history of nicotine dependence: Secondary | ICD-10-CM | POA: Diagnosis not present

## 2021-12-02 DIAGNOSIS — J8410 Pulmonary fibrosis, unspecified: Secondary | ICD-10-CM | POA: Diagnosis present

## 2021-12-02 DIAGNOSIS — Z7189 Other specified counseling: Secondary | ICD-10-CM

## 2021-12-02 DIAGNOSIS — Z66 Do not resuscitate: Secondary | ICD-10-CM

## 2021-12-02 DIAGNOSIS — Z515 Encounter for palliative care: Secondary | ICD-10-CM | POA: Diagnosis not present

## 2021-12-02 DIAGNOSIS — Z950 Presence of cardiac pacemaker: Secondary | ICD-10-CM | POA: Diagnosis not present

## 2021-12-02 DIAGNOSIS — R627 Adult failure to thrive: Secondary | ICD-10-CM | POA: Diagnosis present

## 2021-12-02 DIAGNOSIS — E039 Hypothyroidism, unspecified: Secondary | ICD-10-CM | POA: Diagnosis present

## 2021-12-02 DIAGNOSIS — G459 Transient cerebral ischemic attack, unspecified: Secondary | ICD-10-CM

## 2021-12-02 DIAGNOSIS — I2510 Atherosclerotic heart disease of native coronary artery without angina pectoris: Secondary | ICD-10-CM | POA: Diagnosis present

## 2021-12-02 MED ORDER — OXYCODONE HCL 5 MG PO TABS
5.0000 mg | ORAL_TABLET | ORAL | Status: DC | PRN
  Administered 2021-12-02 – 2021-12-03 (×2): 5 mg via ORAL
  Administered 2021-12-04 – 2021-12-07 (×7): 10 mg via ORAL
  Filled 2021-12-02 (×2): qty 2
  Filled 2021-12-02: qty 1
  Filled 2021-12-02 (×5): qty 2
  Filled 2021-12-02: qty 1

## 2021-12-02 MED ORDER — HALOPERIDOL LACTATE 2 MG/ML PO CONC
2.0000 mg | Freq: Four times a day (QID) | ORAL | Status: DC | PRN
  Filled 2021-12-02: qty 1

## 2021-12-02 MED ORDER — BIOTENE DRY MOUTH MT LIQD
15.0000 mL | Freq: Two times a day (BID) | OROMUCOSAL | Status: DC
  Administered 2021-12-02 – 2021-12-07 (×9): 15 mL via TOPICAL

## 2021-12-02 MED ORDER — ONDANSETRON HCL 4 MG/2ML IJ SOLN
4.0000 mg | Freq: Four times a day (QID) | INTRAMUSCULAR | Status: DC | PRN
  Administered 2021-12-02 – 2021-12-06 (×6): 4 mg via INTRAVENOUS
  Filled 2021-12-02 (×6): qty 2

## 2021-12-02 MED ORDER — ACETAMINOPHEN 650 MG RE SUPP: 650.0000 mg | Freq: Four times a day (QID) | RECTAL | Status: DC | PRN

## 2021-12-02 MED ORDER — HALOPERIDOL LACTATE 5 MG/ML IJ SOLN: 2.0000 mg | Freq: Four times a day (QID) | INTRAMUSCULAR | Status: DC | PRN

## 2021-12-02 MED ORDER — LORAZEPAM 2 MG/ML IJ SOLN: 1.0000 mg | INTRAMUSCULAR | Status: DC | PRN

## 2021-12-02 MED ORDER — POLYVINYL ALCOHOL 1.4 % OP SOLN
1.0000 [drp] | Freq: Four times a day (QID) | OPHTHALMIC | Status: DC | PRN
  Filled 2021-12-02: qty 15

## 2021-12-02 MED ORDER — LORAZEPAM 1 MG PO TABS
1.0000 mg | ORAL_TABLET | ORAL | Status: DC | PRN
  Administered 2021-12-02 – 2021-12-05 (×2): 1 mg via ORAL
  Filled 2021-12-02 (×2): qty 1

## 2021-12-02 MED ORDER — DIPHENHYDRAMINE HCL 50 MG/ML IJ SOLN
12.5000 mg | INTRAMUSCULAR | Status: DC | PRN
  Administered 2021-12-02 – 2021-12-07 (×7): 12.5 mg via INTRAVENOUS
  Filled 2021-12-02 (×7): qty 1

## 2021-12-02 MED ORDER — HALOPERIDOL 1 MG PO TABS
2.0000 mg | ORAL_TABLET | Freq: Four times a day (QID) | ORAL | Status: DC | PRN
  Filled 2021-12-02: qty 2

## 2021-12-02 MED ORDER — GLYCOPYRROLATE 0.2 MG/ML IJ SOLN: 0.2000 mg | INTRAMUSCULAR | Status: DC | PRN

## 2021-12-02 MED ORDER — LORAZEPAM 2 MG/ML PO CONC: 1.0000 mg | ORAL | Status: DC | PRN

## 2021-12-02 MED ORDER — ACETAMINOPHEN 325 MG PO TABS
650.0000 mg | ORAL_TABLET | Freq: Four times a day (QID) | ORAL | Status: DC | PRN
  Administered 2021-12-03: 650 mg via ORAL
  Filled 2021-12-02: qty 2

## 2021-12-02 MED ORDER — GLYCOPYRROLATE 1 MG PO TABS
1.0000 mg | ORAL_TABLET | ORAL | Status: DC | PRN
  Filled 2021-12-02: qty 1

## 2021-12-02 MED ORDER — DICLOFENAC SODIUM 1 % EX GEL
2.0000 g | Freq: Four times a day (QID) | CUTANEOUS | Status: AC
  Administered 2021-12-02 – 2021-12-03 (×2): 2 g via TOPICAL
  Filled 2021-12-02: qty 100

## 2021-12-02 NOTE — Progress Notes (Signed)
?PROGRESS NOTE ? ? ? ?Monica Murillo  EHO:[id number] DOB: 10-26-1932 DOA: 12/01/2021 ?PCP: Yvonna Alanis, NP  ? ? ? ?Brief Narrative:  ? ?Monica Murillo is a 88 y.o. female with medical history significant of pacemaker placement; CAD s/p stent; HTN; hypothyroidism; afib on Xarelto; pulmonary fibrosis on home O2; recently diagnosed metastatic lung CA; and DM presenting with stroke-like symptoms lasting for 20 minutes with recurrent aphasia ? ?Subjective: ? ?She reports right hip pain , low back pain and chest pain, pain meds helped ?Daughter at bedside, reports is working on setting up home hospice ? ?Assessment & Plan: ? Principal Problem: ?  History of recurrent TIAs ?Active Problems: ?  Metastatic lung cancer (metastasis from lung to other site) [facility]) ?  PAF (paroxysmal atrial fibrillation) on chronic anticoagulation (HCC) Complete heart s/p pacemaker ?  Hypothyroidism ?  Essential hypertension ?  Pulmonary fibrosis ([place]) ?  Type 2 diabetes mellitus with hyperglycemia, without long-term current use of insulin ([place]) ?  Dyslipidemia ?  DNR (do not resuscitate) ? ? ?Recurrent TIA ?Metastatic lung cancer ?A-fib ?Hypertension ?HLD ?Hypothyroidism ?Pulmonary fibrosis/chronic hypoxic respiratory failure on 2 L home O2 ?FTT ?  ? ?Patient and family has met with palliative care this morning, plan to start full comfort measures and go home with home hospice once things set up ? ? ? ?DVT prophylaxis: rivaroxaban (XARELTO) tablet 10 mg Start: 12/02/21 1100 ?rivaroxaban (XARELTO) tablet 10 mg  ? ?Code Status:   Code Status: DNR ? ?Family Communication: Daughter at bedside ?Disposition:  ? ?Status is: Inpatient ? ?Dispo: The patient is from: Home ?             Anticipated d/c is to: Home hospice ?             Anticipated d/c date is: Possible tomorrow once things set up at home ? ?Antimicrobials:   ? ?Anti-infectives (From admission, onward)  ? ? None  ? ?  ? ? ? ? ? ?Objective: ?Vitals:  ? 12/02/21 1645 12/02/21 1700  12/02/21 1715 12/02/21 1730  ?BP: (!) 154/72 (!) 136/53 (!) 128/54 (!) 140/55  ?Pulse: 61 61 60 63  ?Resp: 19 17 16 16   ?Temp:      ?TempSrc:      ?SpO2: 96% 97% 97% 98%  ?Weight:      ?Height:      ? ? ?Intake/Output Summary (Last 24 hours) at 12/02/2021 1930 ?Last data filed at 12/02/2021 1233 ?Gross per 24 hour  ?Intake 30.5 ml  ?Output --  ?Net 30.5 ml  ? ?Filed Weights  ? 12/01/21 1237  ?Weight: 49 kg  ? ? ?Examination: ? ?General exam: alert, awake, communicative,calm, does not appear in acute distress ?Respiratory system:  on 2liter oxygen , Respiratory effort normal. ?Cardiovascular system:  RRR.  ? ? ? ?Data Reviewed: I have personally reviewed  labs and visualized  imaging studies since the last encounter and formulate the plan  ? ? ? ? ? ? ?Scheduled Meds: ? antiseptic oral rinse  15 mL Topical BID  ? aspirin  300 mg Rectal Daily  ? Or  ? aspirin  325 mg Oral Daily  ? diclofenac Sodium  2 g Topical QID  ? gabapentin  200 mg Oral QHS  ? levothyroxine  125 mcg Oral QAC breakfast  ? rivaroxaban  10 mg Oral Daily  ? ?Continuous Infusions: ? ? LOS: 0 days  ? ? ? ? ? ?Florencia Reasons, MD PhD FACP ?[facility] ? ?  Available via Epic secure chat 7am-7pm for nonurgent issues ?Please page for urgent issues ?To page the attending provider between 7A-7P or the covering provider during after hours 7P-7A, please log into the web site [email or web address] and access using universal [place] password for that web site. If you do not have the password, please call the hospital operator. ? ? ? ?12/02/2021, 7:30 PM  ? ? ?

## 2021-12-02 NOTE — Progress Notes (Addendum)
Manufacturing [facility]) Hospital Liaison Note ? ?Referral received for patient/family interest in hospice at [facility]. [facility] liaison spoke with patient's daughter Adonis Brook. Interest confirmed.  ? ?Hospice eligibility confirmed  ? ?Plan is to dc back to [facility] living via [facility] once medically stable.  ? ?DME in the home: walker, wheelchair, oxygen, and over the toilet commode.  ? ?DME needs: AV nurse will assess ? ?Please send comfort prescriptions/medications home with patient at discharge.  ? ?Please call with any questions or concerns. Thank you ? ?Roselee Nova, LCSW ?[facility] Liaison ?[phone] ?

## 2021-12-02 NOTE — Discharge Planning (Signed)
RNCM following for disposition needs.  Current plan is to go home with hospice through [facility].  AC liaison, Roselee Nova, SW aware of plan and is following. ? ? ?Sawsan Riggio J. Clydene Laming, [place], [place], [place] [phone] ? ?

## 2021-12-02 NOTE — Discharge Instructions (Signed)
[facility] ? ?[facility] ?Address: [address], [place], [place] [address] ?Phone: [phone] ? ?[facility] ?Address: [address] [place], [place], [place] [address] ?Phone: [phone] ? ?[facility] ?Address: [address], [place], [place] [address] ?Phone: [phone] ? ?[facility] ?Address: [address], [place], [place] [address] ?Phone: [phone] ? ?[facility] ?Address: [address] [place], [place], [place] [address] ?Phone: [phone] ? ?[facility] ?Address:  [address], [place], [place] [address] ?Phone:  [phone] ? ?[facility] ?Address:  [address] ?Phone:  [phone] ? ?[email or web address] ? ?[facility] ?[phone] ?  ?

## 2021-12-02 NOTE — Consult Note (Signed)
?Consultation Note ?Date: 12/02/2021  ? ?Patient Name: Monica Murillo  ?DOB: July 11, 1933  MRN: [id number]  Age / Sex: 88 y.o., female  ?PCP: Monica Alanis, NP ?Referring Physician: Florencia Reasons, MD ? ?Reason for Consultation: Establishing goals of care ? ?HPI/Patient Profile: 88 y.o. female  with past medical history of pacemaker placement, CAD s/p stent, HTN, hypothyroidism, afib on Xarelto, pulmonary fibrosis on home O2, metastatic lung CA, and DM presented to the ED on 12/01/21 from [facility] after being found with slurred speech, facial droop, and right arm weakness. OF note, she was recently admitted from 4/2-11/22/21 with TIA. Patient was admitted on 12/01/2021 with recurrent TIA and metastatic lung cancer.  ? ? ?Clinical Assessment and Goals of Care: ?I have reviewed medical records including EPIC notes, labs, and imaging. Received report from primary RN - no acute concerns.  ? ?Went to visit patient at bedside - daughter/Monica Murillo present. Patient was lying in bed awake, alert, oriented, and able to participate in conversation. No signs or non-verbal gestures of pain or discomfort noted. No respiratory distress, increased work of breathing, or secretions noted.  Patient denies pain but does endorse feeling weak. ? ?Met with patient and her daughter to discuss diagnosis, prognosis, GOC, EOL wishes, disposition, and options. ? ?I introduced Palliative Medicine as specialized medical care for people living with serious illness. It focuses on providing relief from the symptoms and stress of a serious illness. The goal is to improve quality of life for both the patient and the family. ? ?We discussed a brief life review of the patient as well as functional and nutritional status.  Patient's husband is passed away -she has 2 daughters and 1 son.  She also has 2 cats that are very important to her.  Prior to hospitalization, patient was  at an independent living [facility] care.  She moved there from [place] in December 2022.  At facility she is able to use a walker for short distances and a wheelchair for longer distances.  She also has private duty caregivers that visit with her 7 days a week in the morning and evening.  In February 2023 patient had a chest x-ray that showed concern for cancer and closer to the end of March 2023 cancer was confirmed with mets to her lymph nodes and bones.  2 weeks ago she began having TIAs -per daughter she has had 3 since but could be more.  Patient reports her appetite is "normally good" but states it has been poor for the past week.  She has been receiving PT services 1 time per week and was recently discharged.  Physicians outpatient have talked to patient and family about pursuing a biopsy to confirm cancer diagnosis; however, patient has been hesitant to proceed as she is clear that she would not want to pursue treatment.  Her goal is also not to be rehospitalized. ? ?We discussed patient's current illness and what it means in the larger context of patient's on-going co-morbidities.  Patient and Monica Murillo have a clear understanding of her current acute medical situation.  Natural disease trajectory and expectations at EOL were discussed. I attempted to elicit values and goals of care important to the patient. The difference between aggressive medical intervention and comfort care was considered in light of the patient's goals of care.  Patient is clear she would not want to pursue treatment for her cancer; however, patient and family tell me that they were under the impression that biopsy would  have to be performed before they could pursue hospice services.  Clarified that this was not the case -that if patient did not want to pursue treatment biopsy and bronch would be unnecessary as it would not change her overall outcome.  Patient is clear that she only wants to focus on comfort and quality  of life, allowing nature to take its course.  She does not want to pursue TIA or cancer work-up -no MRI or bronch/biopsy. ? ?Provided education and counseling at length on the philosophy and benefits of hospice care. Discussed that it offers a holistic approach to care in the setting of end-stage illness, and is about supporting the patient where they are allowing nature to take it's course. Discussed the hospice team includes RNs, physicians, social workers, and chaplains. They can provide personal care, support for the family, and help keep patient out of the hospital as well as assist with DME needs for home hospice. Education provided on the difference between home vs residential hospice.  Patient and family understand she would not qualify for residential hospice at this time and home hospice was recommended.  Lengthy discussion had around disposition with hospice to include: Returning to independent living versus assisted living facility versus long-term care with private duty caregivers in addition to hospice services.  Discussed that patient would likely benefit at this time from for 24/7 supervision and assistance especially as it is anticipated she will continue to decline.  Daughter will look into expanding private duty caregivers versus moving to assisted living facility.  Monica Murillo and patient request hospice services through [facility]. ? ?DME was discussed -this liaison to review further for needs. ? ?We talked about transition to comfort measures in house and what that would entail inclusive of medications to control pain, dyspnea, agitation, nausea, and itching. We discussed stopping all unnecessary measures such as blood draws, needle sticks, oxygen, antibiotics, CBGs/insulin, cardiac monitoring, IVF, and frequent vital signs.  Patient is agreeable for transition to full comfort care today.  Patient will remain in-house as daughter works on getting needed support in place for patient's discharge  back to independent living facility versus assisted living facility. ? ?Discussed with patient/family the importance of continued conversation with each other and the medical providers regarding overall plan of care and treatment options, ensuring decisions are within the context of the patient?s values and GOCs.   ? ?Questions and concerns were addressed. The patient/family was encouraged to call with questions and/or concerns. PMT card was provided. ? ? ?Primary Decision Maker: ?PATIENT ?  ? ?SUMMARY OF RECOMMENDATIONS   ?Initiated full comfort measures ?Continue DNR/DNI as previously documented ?Patient does not want to pursue treatment or work up for cancer or TIA ?Discharge back to [facility] with home hospice once DME delivered and appropriate support in place to care for patient.  Patient's daughter is working on additional private duty caregiver support versus assisted living facility transfer. Patient/family hopeful for discharge tomorrow 4/14 ?TOC and ACC liaison notified and TOC consulted for: home hospice referral - patient/family request [facility] ?Added orders for EOL symptom management and to reflect full comfort measures, as well as discontinued orders that were not focused on comfort ?Unrestricted visitation orders were placed per current [place] EOL visitation policy  ?Nursing to provide frequent assessments and administer PRN medications as clinically necessary to ensure EOL comfort ?PMT will continue to follow and support holistically ? ?Symptom Management ?Continue aspirin, gabapentin, Synthroid, Xarelto ?Continue morphine PRN severe pain ?Adjusted oxycodone to provide dose  range of 5 -10 mg PRN moderate pain ?Can also consider starting dexamethasone 4 mg daily with breakfast for bone pain pending symptoms ?Tylenol PRN pain/fever ?Biotin twice daily ?Benadryl PRN itching ?Robinul PRN secretions ?Haldol PRN agitation/delirium ?Ativan PRN anxiety/seizure/sleep/distress ?Zofran PRN  nausea/vomiting ?Liquifilm Tears PRN dry eye ? ? ? ?Code Status/Advance Care Planning: ?DNR ? ?Palliative Prophylaxis:  ?Aspiration, Bowel Regimen, Delirium Protocol, Frequent Pain Assessment, Oral Care, a

## 2021-12-03 ENCOUNTER — Telehealth: Payer: Self-pay | Admitting: Pulmonary Disease

## 2021-12-03 ENCOUNTER — Telehealth: Payer: Self-pay | Admitting: Internal Medicine

## 2021-12-03 DIAGNOSIS — G893 Neoplasm related pain (acute) (chronic): Secondary | ICD-10-CM | POA: Diagnosis not present

## 2021-12-03 DIAGNOSIS — Z8673 Personal history of transient ischemic attack (TIA), and cerebral infarction without residual deficits: Secondary | ICD-10-CM | POA: Diagnosis not present

## 2021-12-03 DIAGNOSIS — C3490 Malignant neoplasm of unspecified part of unspecified bronchus or lung: Secondary | ICD-10-CM | POA: Diagnosis not present

## 2021-12-03 DIAGNOSIS — Z66 Do not resuscitate: Secondary | ICD-10-CM | POA: Diagnosis not present

## 2021-12-03 NOTE — ED Notes (Signed)
Changed ben linen, brief, and gown. Applied barrier cream for incontinence  ?

## 2021-12-03 NOTE — Telephone Encounter (Signed)
Cancelled new pt appt per 4/14 family request, pt decided on hospice care ?

## 2021-12-03 NOTE — Telephone Encounter (Signed)
Called and spoke with pt's daughter Adonis Brook who states that they are going to cancel pt's biopsy that was scheduled. States that pt is now under hospice care.  Pt has had 3 TIA's within the past 12 days and is currently in the hospital after the last one. States due to pt being under tremendous pain from the cancer is why they have called in hospice to help get things under control and get pt comfortable. ? ?Checked pt's chart and saw that the biopsy has already been cancelled. Routing to [place] as an Pharmacist, hospital. Nothing further needed. ?

## 2021-12-03 NOTE — Progress Notes (Addendum)
? ?                                                                                                                                                     ?                                                   ?Daily Progress Note  ? ?Patient Name: Monica Murillo       Date: 12/03/2021 ?DOB: Feb 13, 1933  Age: 88 y.o. MRN#: [id number] ?Attending Physician: Florencia Reasons, MD ?Primary Care Physician: Yvonna Alanis, NP ?Admit Date: 12/01/2021 ? ?Reason for Consultation/Follow-up: Establishing goals of care ? ?Subjective: ?Medical records reviewed. Patient assessed at the bedside and denies pain or distress. Her daughter Leroy Kennedy is present and we relocate to a consultation room to continue the discussion. ? ?Leroy Kennedy shares that she is feeling quite overwhelmed by planning for patient's return home with hospice. Patient is on board with this, however the logistics are still confusing and uncertain. We completed an in depth review of patient's functional and nutritional status, symptom management needs, typical resources available through hospice, and the pros and cons of additional caregiver support at ILF versus transfer to ALF at patient's facility. Aram Beecham would find great joy in spending time with her cats in her final days and this is the priority. We also reviewed residential hospice options, as patient's intake has drastically decrease and she may require transfer to a facility shortly after some time at home. Discussed needed supplies and equipment at home and potential for discharge today. Provided reassurance that patient will be safe and comfortable until a safe discharge plan could be finalized. Prognosis of likely weeks and end of life trajectory/expectations were discussed in detail. Patient's daughter understands that anything can happen at any time as well.  ? ?Questions and concerns addressed. PMT will continue to support holistically. ? ?Length of Stay: 1 ? ?Current Medications: ?Scheduled Meds:  ? antiseptic oral  rinse  15 mL Topical BID  ? aspirin  300 mg Rectal Daily  ? Or  ? aspirin  325 mg Oral Daily  ? diclofenac Sodium  2 g Topical QID  ? gabapentin  200 mg Oral QHS  ? levothyroxine  125 mcg Oral QAC breakfast  ? rivaroxaban  10 mg Oral Daily  ? ? ?Continuous Infusions: ? ? ?PRN Meds: ?acetaminophen **OR** acetaminophen, diphenhydrAMINE, glycopyrrolate **OR** glycopyrrolate **OR** glycopyrrolate, haloperidol **OR** haloperidol **OR** haloperidol lactate, LORazepam **OR** LORazepam **OR** LORazepam, morphine injection, ondansetron (ZOFRAN) IV, oxyCODONE, polyvinyl alcohol, senna-docusate ? ?Physical Exam ?Vitals and nursing note reviewed.  ?Constitutional:   ?   General: She is not in acute distress. ?   Appearance:  She is ill-appearing.  ?Cardiovascular:  ?   Rate and Rhythm: Normal rate.  ?Pulmonary:  ?   Effort: Pulmonary effort is normal. No respiratory distress.  ?Skin: ?   General: Skin is warm and dry.  ?Neurological:  ?   Mental Status: She is alert. Mental status is at baseline.  ?Psychiatric:     ?   Mood and Affect: Mood normal.  ?         ? ?Vital Signs: BP (!) 145/72   Pulse 82   Temp (!) 97.2 ?F (36.2 ?C) (Oral)   Resp 18   Ht 5' (1.524 m)   Wt 49 kg   SpO2 94%   BMI 21.09 kg/m?  ?SpO2: SpO2: 94 % ?O2 Device: O2 Device: Room Air ?O2 Flow Rate: O2 Flow Rate (L/min): 2 L/min ? ?Intake/output summary: No intake or output data in the 24 hours ending 12/03/21 1236 ?LBM:   ?Baseline Weight: Weight: 49 kg ?Most recent weight: Weight: 49 kg ? ?     ?Palliative Assessment/Data: ? ? ? ?Flowsheet Rows   ? ?Flowsheet Row Most Recent Value  ?Intake Tab   ?Referral Department Hospitalist  ?Unit at Time of Referral ER  ?Palliative Care Primary Diagnosis Cancer  ?Date Notified 12/01/21  ?Palliative Care Type New Palliative care  ?Reason for referral Clarify Goals of Care  ?Date of Admission 12/01/21  ?Date first seen by Palliative Care 12/02/21  ?# of days Palliative referral response time 1 Day(s)  ?# of days IP  prior to Palliative referral 0  ?Clinical Assessment   ?Psychosocial & Spiritual Assessment   ?Palliative Care Outcomes   ?Patient/Family meeting held? Yes  ?Who was at the meeting? patient/daughter  ?Palliative Care Outcomes Improved pain interventions, Improved non-pain symptom therapy, Clarified goals of care, Counseled regarding hospice, Provided end of life care assistance, Provided psychosocial or spiritual support, Changed to focus on comfort, Transitioned to hospice  ?Patient/Family wishes: Interventions discontinued/not started  Mechanical Ventilation, Antibiotics, Tube feedings/TPN, BiPAP, Hemodialysis, NIPPV, Trach, Transfusion, PEG, Vasopressors, Transfer out of ICU  ? ?  ? ? ?Patient Active Problem List  ? Diagnosis Date Noted  ? History of recurrent TIAs 12/01/2021  ? Dyslipidemia 12/01/2021  ? DNR (do not resuscitate) 12/01/2021  ? Aphasia 11/21/2021  ? Aphasia secondary to suspected TIA (transient ischemic attack) 11/21/2021  ? Normocytic anemia 11/21/2021  ? Metastatic lung cancer (metastasis from lung to other site) [facility]) 11/21/2021  ? Fall at home 08/22/2021  ? Essential hypertension 08/22/2021  ? PAF (paroxysmal atrial fibrillation) on chronic anticoagulation (HCC) Complete heart s/p pacemaker 08/22/2021  ? Femur fracture, right ([place]) 08/18/2021  ? Complete heart block ([place]) 07/30/2021  ? Pulmonary fibrosis ([place]) 07/30/2021  ? Type 2 diabetes mellitus with hyperglycemia, without long-term current use of insulin ([place]) 07/30/2021  ? Hypothyroidism 07/30/2021  ? Gastroesophageal reflux disease without esophagitis 07/30/2021  ? Macular degeneration of both eyes 07/30/2021  ? Chronic right hip pain 07/30/2021  ? Memory impairment 07/30/2021  ? Past heart attack 07/30/2021  ? ? ?Palliative Care Assessment & Plan  ? ?Patient Profile: ?88 y.o. female  with past medical history of pacemaker placement, CAD s/p stent, HTN, hypothyroidism, afib on Xarelto, pulmonary fibrosis on home O2, metastatic lung CA,  and DM presented to the ED on 12/01/21 from [facility] after being found with slurred speech, facial droop, and right arm weakness. OF note, she was recently admitted from 4/2-11/22/21 with TIA. Patient was admitted on 12/01/2021 with recurrent  TIA and metastatic lung cancer.  ?  ? ?Assessment: ?End of life care ?Goals of care conversation ? ?Recommendations/Plan: ?Continue comfort care per [facility] ?Patient's daughter will continue efforts to arrange private caregiver support in the home ?Hospice DME to be delivered today ?Psychosocial and emotional support provided ?PMT will continue to follow ? ?Prognosis: ? Weeks to months ? ?Discharge Planning: ?ILF with hospice ? ?Care plan was discussed with patient, patient's daughter, RN, Dr. Erlinda Hong, [facility] hospital liaison  ? ? ?Total time: ?I spent 100 minutes in the care of the patient today in the above activities and documenting the encounter. ? ? ?Dorthy Cooler, PA-C ?Palliative Medicine Team ?Team phone # [phone] ? ?Thank you for allowing the Palliative Medicine Team to assist in the care of this patient. Please utilize secure chat with additional questions, if there is no response within 30 minutes please call the above phone number. ? ?Palliative Medicine Team providers are available by phone from 7am to 7pm daily and can be reached through the team cell phone.  ?Should this patient require assistance outside of these hours, please call the patient's attending physician.  ? ? ? ? ?

## 2021-12-03 NOTE — Progress Notes (Signed)
?PROGRESS NOTE ? ? ? ?Monica Murillo  SPQ:[id number] DOB: 28-Aug-1932 DOA: 12/01/2021 ?PCP: Yvonna Alanis, NP  ? ? ? ?Brief Narrative:  ? ?Monica Murillo is a 88 y.o. female with medical history significant of pacemaker placement; CAD s/p stent; HTN; hypothyroidism; afib on Xarelto; pulmonary fibrosis on home O2; recently diagnosed metastatic lung CA; and DM presenting with stroke-like symptoms lasting for 20 minutes with recurrent aphasia ? ?Subjective: ? ?Denies new complaints, eating lunch ?RN at bedside  ? ? ?Assessment & Plan: ? Principal Problem: ?  History of recurrent TIAs ?Active Problems: ?  Metastatic lung cancer (metastasis from lung to other site) [facility]) ?  PAF (paroxysmal atrial fibrillation) on chronic anticoagulation (HCC) Complete heart s/p pacemaker ?  Hypothyroidism ?  Essential hypertension ?  Pulmonary fibrosis ([place]) ?  Type 2 diabetes mellitus with hyperglycemia, without long-term current use of insulin ([place]) ?  Dyslipidemia ?  DNR (do not resuscitate) ? ? ?Recurrent TIA ?Metastatic lung cancer ?A-fib ?Hypertension ?HLD ?Hypothyroidism ?Pulmonary fibrosis/chronic hypoxic respiratory failure on 2 L home O2 ?FTT ?  ? ?Patient and family has met with palliative care , started full comfort measures and go home with home hospice once things set up ? ? ? ?DVT prophylaxis: rivaroxaban (XARELTO) tablet 10 mg Start: 12/02/21 1100 ?rivaroxaban (XARELTO) tablet 10 mg  ? ?Code Status:   Code Status: DNR ? ?Family Communication: Daughter at bedside on 4/13 ?Disposition:  ? ?Status is: Inpatient ? ?Dispo: The patient is from: Home ?             Anticipated d/c is to: Home hospice ?             Anticipated d/c date is: Possible tomorrow once things set up at home ? ?Antimicrobials:   ? ?Anti-infectives (From admission, onward)  ? ? None  ? ?  ? ? ? ? ? ?Objective: ?Vitals:  ? 12/03/21 0900 12/03/21 0915 12/03/21 0922 12/03/21 1100  ?BP: (!) 166/88  (!) 166/88 (!) 145/72  ?Pulse: (!) 104 92 92 82  ?Resp:    18   ?Temp:      ?TempSrc:      ?SpO2: 90% 92% 98% 94%  ?Weight:      ?Height:      ? ? ?Intake/Output Summary (Last 24 hours) at 12/03/2021 1520 ?Last data filed at 12/03/2021 1238 ?Gross per 24 hour  ?Intake 360 ml  ?Output --  ?Net 360 ml  ? ?Filed Weights  ? 12/01/21 1237  ?Weight: 49 kg  ? ? ?Examination: ? ?General exam: alert, awake, communicative,calm, does not appear in acute distress ?Respiratory system:  on 2liter oxygen , Respiratory effort normal. ?Cardiovascular system:  RRR.  ? ? ? ?Data Reviewed: I have personally reviewed  labs and visualized  imaging studies since the last encounter and formulate the plan  ? ? ? ? ? ? ?Scheduled Meds: ? antiseptic oral rinse  15 mL Topical BID  ? aspirin  300 mg Rectal Daily  ? Or  ? aspirin  325 mg Oral Daily  ? gabapentin  200 mg Oral QHS  ? levothyroxine  125 mcg Oral QAC breakfast  ? rivaroxaban  10 mg Oral Daily  ? ?Continuous Infusions: ? ? LOS: 1 day  ? ? ? ? ? ?Florencia Reasons, MD PhD FACP ?[facility] ? ?Available via Epic secure chat 7am-7pm for nonurgent issues ?Please page for urgent issues ?To page the attending provider between 7A-7P or the covering provider during  after hours 7P-7A, please log into the web site [email or web address] and access using universal [place] password for that web site. If you do not have the password, please call the hospital operator. ? ? ? ?12/03/2021, 3:20 PM  ? ? ?

## 2021-12-03 NOTE — ED Notes (Signed)
Pt care taken, resting, no complaints at this time 

## 2021-12-03 NOTE — ED Notes (Signed)
Pt eating cake, denies any pain at this time ?

## 2021-12-03 NOTE — Progress Notes (Signed)
[employer] [facility]) Hospital Liaison Note ?  ?Hospice eligibility confirmed  ?  ?Plan is to dc back to [facility] via [place] once family is able to arrange additional caregivers. Family is in communication with hospital IDT to finalize DC date. Per family request, Hospital bed & [facility] were ordered this AM and are anticipated to arrive this pm. ?  ?Please send comfort prescriptions/medications home with patient at discharge.  ?  ?Please call with any questions or concerns. Thank you ?  ?Daphene Calamity, MSW ?[facility] Liaison ?[phone] ?

## 2021-12-03 NOTE — ED Notes (Signed)
Moved pt into hospital bed for comfort ?

## 2021-12-04 DIAGNOSIS — Z515 Encounter for palliative care: Secondary | ICD-10-CM | POA: Insufficient documentation

## 2021-12-04 DIAGNOSIS — G893 Neoplasm related pain (acute) (chronic): Secondary | ICD-10-CM | POA: Diagnosis not present

## 2021-12-04 DIAGNOSIS — Z8673 Personal history of transient ischemic attack (TIA), and cerebral infarction without residual deficits: Secondary | ICD-10-CM | POA: Diagnosis not present

## 2021-12-04 DIAGNOSIS — C3490 Malignant neoplasm of unspecified part of unspecified bronchus or lung: Secondary | ICD-10-CM | POA: Diagnosis not present

## 2021-12-04 DIAGNOSIS — J8410 Pulmonary fibrosis, unspecified: Secondary | ICD-10-CM | POA: Diagnosis not present

## 2021-12-04 NOTE — Progress Notes (Signed)
[place] [phone] [facility]) ?Hospice hospital liaison note ? ?[facility] liaison continues to follow patient for discharge planning. Per daughter DME was delivered yesterday and she will have caregivers in place on Monday.  ? ?She requests transport home be arranged for Monday morning, TOC notified.  ? ?Assessment visit with nurse planned for 2pm Monday in patient's home.  ? ?Thank you for the opportunity to participate in this patient's care ? ?Jhonnie Garner, BSN, Therapist, sports, WTA-C ?Hospice hospital liaison ?[phone] ? ?

## 2021-12-04 NOTE — Progress Notes (Signed)
?PROGRESS NOTE ? ? ? ?Monica Murillo  CBU:[id number] DOB: 01-Jul-1933 DOA: 12/01/2021 ?PCP: Yvonna Alanis, NP  ? ? ? ?Brief Narrative:  ? ?Monica Murillo is a 88 y.o. female with medical history significant of pacemaker placement; CAD s/p stent; HTN; hypothyroidism; afib on Xarelto; pulmonary fibrosis on home O2; recently diagnosed metastatic lung CA; and DM presenting with stroke-like symptoms lasting for 20 minutes with recurrent aphasia ? ?Subjective: ? ?Awaiting home hospice /caregiver set up ? ? ? ?Assessment & Plan: ? Principal Problem: ?  History of recurrent TIAs ?Active Problems: ?  Metastatic lung cancer (metastasis from lung to other site) [facility]) ?  PAF (paroxysmal atrial fibrillation) on chronic anticoagulation (HCC) Complete heart s/p pacemaker ?  Hypothyroidism ?  Essential hypertension ?  Pulmonary fibrosis ([place]) ?  Type 2 diabetes mellitus with hyperglycemia, without long-term current use of insulin ([place]) ?  Dyslipidemia ?  DNR (do not resuscitate) ? ? ?Recurrent TIA ?Metastatic lung cancer ?A-fib ?Hypertension ?HLD ?Hypothyroidism ?Pulmonary fibrosis/chronic hypoxic respiratory failure on 2 L home O2 ?FTT ?  ? ?Patient and family has met with palliative care , started full comfort measures and go home with home hospice once things set up ? ? ? ?DVT prophylaxis: rivaroxaban (XARELTO) tablet 10 mg Start: 12/02/21 1100 ?rivaroxaban (XARELTO) tablet 10 mg  ? ?Code Status:   Code Status: DNR ? ?Family Communication: Daughter at bedside on 4/13 and 4/15 ?Disposition:  ? ?Status is: Inpatient ? ?Dispo: The patient is from: Home/independent living ?             Anticipated d/c is to: Home hospice ?             Anticipated d/c date is: daughter states I will be Monday before things ca be set up at home ? ?Antimicrobials:   ? ?Anti-infectives (From admission, onward)  ? ? None  ? ?  ? ? ? ? ? ?Objective: ?Vitals:  ? 12/04/21 0100 12/04/21 0415 12/04/21 0630 12/04/21 0816  ?BP:    (!) 151/74  ?Pulse: 74 78  70 91  ?Resp:    16  ?Temp:    98.4 ?F (36.9 ?C)  ?TempSrc:    Oral  ?SpO2: 96% 97% 96% 95%  ?Weight:      ?Height:      ? ? ?Intake/Output Summary (Last 24 hours) at 12/04/2021 0953 ?Last data filed at 12/03/2021 1238 ?Gross per 24 hour  ?Intake 360 ml  ?Output --  ?Net 360 ml  ? ?Filed Weights  ? 12/01/21 1237  ?Weight: 49 kg  ? ? ?Examination: ? ?General exam: alert, awake, communicative,calm, does not appear in acute distress ?Respiratory system:  on 2liter oxygen , Respiratory effort normal. ?Cardiovascular system:  RRR.  ? ? ? ?Data Reviewed: I have personally reviewed  labs and visualized  imaging studies since the last encounter and formulate the plan  ? ? ? ? ? ? ?Scheduled Meds: ? antiseptic oral rinse  15 mL Topical BID  ? aspirin  300 mg Rectal Daily  ? Or  ? aspirin  325 mg Oral Daily  ? gabapentin  200 mg Oral QHS  ? levothyroxine  125 mcg Oral QAC breakfast  ? rivaroxaban  10 mg Oral Daily  ? ?Continuous Infusions: ? ? LOS: 2 days  ? ? ? ? ? ?Florencia Reasons, MD PhD FACP ?[facility] ? ?Available via Epic secure chat 7am-7pm for nonurgent issues ?Please page for urgent issues ?To page the attending provider  between 7A-7P or the covering provider during after hours 7P-7A, please log into the web site [email or web address] and access using universal [place] password for that web site. If you do not have the password, please call the hospital operator. ? ? ? ?12/04/2021, 9:53 AM  ? ? ?

## 2021-12-04 NOTE — Progress Notes (Signed)
CSW contacted [facility] liaison and noted patient's equipment and services have been set up and equipment delivered. CSW left HIPPA compliant voicemail with daughter and updated ED team.  ?

## 2021-12-04 NOTE — ED Notes (Signed)
Family at bedside. 

## 2021-12-04 NOTE — Progress Notes (Addendum)
? ?                                                                                                                                                     ?                                                   ?Daily Progress Note  ? ?Patient Name: Monica Murillo       Date: 12/04/2021 ?DOB: 1933-08-21  Age: 88 y.o. MRN#: [id number] ?Attending Physician: Florencia Reasons, MD ?Primary Care Physician: Yvonna Alanis, NP ?Admit Date: 12/01/2021 ? ?Reason for Consultation/Follow-up: Establishing goals of care ? ?Subjective: ?Medical records reviewed including progress notes. Discussed with RN. Patient assessed at the bedside and denies pain or distress. No family present during my visit. ? ?Patient shares that her pain is well controlled and she has no further needs at this time. She looks forward to being moved to a more comfortable room upstairs while arrangements for discharge home are finalized.  ? ?Questions and concerns addressed. PMT will continue to support holistically. ? ?Length of Stay: 2 ? ?Current Medications: ?Scheduled Meds:  ? antiseptic oral rinse  15 mL Topical BID  ? aspirin  300 mg Rectal Daily  ? Or  ? aspirin  325 mg Oral Daily  ? gabapentin  200 mg Oral QHS  ? levothyroxine  125 mcg Oral QAC breakfast  ? rivaroxaban  10 mg Oral Daily  ? ? ?PRN Meds: ?acetaminophen **OR** acetaminophen, diphenhydrAMINE, glycopyrrolate **OR** glycopyrrolate **OR** glycopyrrolate, haloperidol **OR** haloperidol **OR** haloperidol lactate, LORazepam **OR** LORazepam **OR** LORazepam, morphine injection, ondansetron (ZOFRAN) IV, oxyCODONE, polyvinyl alcohol, senna-docusate ? ?Physical Exam ?Vitals and nursing note reviewed.  ?Constitutional:   ?   General: She is not in acute distress. ?   Appearance: She is ill-appearing.  ?Cardiovascular:  ?   Rate and Rhythm: Normal rate.  ?Pulmonary:  ?   Effort: Pulmonary effort is normal. No respiratory distress.  ?Skin: ?   General: Skin is warm and dry.  ?Neurological:  ?   Mental Status: She  is alert. Mental status is at baseline.  ?Psychiatric:     ?   Mood and Affect: Mood normal.  ?         ? ?Vital Signs: BP 136/71 (BP Location: Right Arm)   Pulse 78   Temp 98.4 ?F (36.9 ?C) (Oral)   Resp 17   Ht 5' (1.524 m)   Wt 49 kg   SpO2 96%   BMI 21.09 kg/m?  ?SpO2: SpO2: 96 % ?O2 Device: O2 Device: Nasal Cannula ?O2 Flow Rate: O2 Flow Rate (L/min): 2 L/min ? ?Intake/output summary: No intake  or output data in the 24 hours ending 12/04/21 1437 ?LBM:   ?Baseline Weight: Weight: 49 kg ?Most recent weight: Weight: 49 kg ? ?     ?Palliative Assessment/Data: 50% ? ? ? ?Flowsheet Rows   ? ?Flowsheet Row Most Recent Value  ?Intake Tab   ?Referral Department Hospitalist  ?Unit at Time of Referral ER  ?Palliative Care Primary Diagnosis Cancer  ?Date Notified 12/01/21  ?Palliative Care Type New Palliative care  ?Reason for referral Clarify Goals of Care  ?Date of Admission 12/01/21  ?Date first seen by Palliative Care 12/02/21  ?# of days Palliative referral response time 1 Day(s)  ?# of days IP prior to Palliative referral 0  ?Clinical Assessment   ?Psychosocial & Spiritual Assessment   ?Palliative Care Outcomes   ?Patient/Family meeting held? Yes  ?Who was at the meeting? patient/daughter  ?Palliative Care Outcomes Improved pain interventions, Improved non-pain symptom therapy, Clarified goals of care, Counseled regarding hospice, Provided end of life care assistance, Provided psychosocial or spiritual support, Changed to focus on comfort, Transitioned to hospice  ?Patient/Family wishes: Interventions discontinued/not started  Mechanical Ventilation, Antibiotics, Tube feedings/TPN, BiPAP, Hemodialysis, NIPPV, Trach, Transfusion, PEG, Vasopressors, Transfer out of ICU  ? ?  ? ? ?Patient Active Problem List  ? Diagnosis Date Noted  ? Cancer related pain   ? End of life care   ? History of recurrent TIAs 12/01/2021  ? Dyslipidemia 12/01/2021  ? DNR (do not resuscitate) 12/01/2021  ? Aphasia 11/21/2021  ? Aphasia  secondary to suspected TIA (transient ischemic attack) 11/21/2021  ? Normocytic anemia 11/21/2021  ? Metastatic lung cancer (metastasis from lung to other site) [facility]) 11/21/2021  ? Fall at home 08/22/2021  ? Essential hypertension 08/22/2021  ? PAF (paroxysmal atrial fibrillation) on chronic anticoagulation (HCC) Complete heart s/p pacemaker 08/22/2021  ? Femur fracture, right ([place]) 08/18/2021  ? Complete heart block ([place]) 07/30/2021  ? Pulmonary fibrosis ([place]) 07/30/2021  ? Type 2 diabetes mellitus with hyperglycemia, without long-term current use of insulin ([place]) 07/30/2021  ? Hypothyroidism 07/30/2021  ? Gastroesophageal reflux disease without esophagitis 07/30/2021  ? Macular degeneration of both eyes 07/30/2021  ? Chronic right hip pain 07/30/2021  ? Memory impairment 07/30/2021  ? Past heart attack 07/30/2021  ? ? ?Palliative Care Assessment & Plan  ? ?Patient Profile: ?88 y.o. female  with past medical history of pacemaker placement, CAD s/p stent, HTN, hypothyroidism, afib on Xarelto, pulmonary fibrosis on home O2, metastatic lung CA, and DM presented to the ED on 12/01/21 from [facility] after being found with slurred speech, facial droop, and right arm weakness. OF note, she was recently admitted from 4/2-11/22/21 with TIA. Patient was admitted on 12/01/2021 with recurrent TIA and metastatic lung cancer.  ?  ? ?Assessment: ?End of life care ?Goals of care conversation ? ?Recommendations/Plan: ?Continue comfort care per [facility], no adjustments required today ?Patient's daughter will continue efforts to arrange private caregiver support in the home - anticipate Monday at the earliest ?Psychosocial and emotional support provided ?PMT will continue to follow ? ?Prognosis: ? Weeks to months ? ?Discharge Planning: ?ILF with hospice ? ?Care plan was discussed with patient, RN, Dr. Erlinda Hong, [facility] ? ?MDM: [place] ? ?Dorthy Cooler, PA-C ?Palliative Medicine Team ?Team phone # [phone] ? ?Thank you for allowing the  Palliative Medicine Team to assist in the care of this patient. Please utilize secure chat with additional questions, if there is no response within 30 minutes please call the above phone number. ? ?  Palliative Medicine Team providers are available by phone from 7am to 7pm daily and can be reached through the team cell phone.  ?Should this patient require assistance outside of these hours, please call the patient's attending physician.  ? ? ? ? ?

## 2021-12-04 NOTE — ED Notes (Signed)
Called 6N to ask for purpleman to be initated. ED CN notified. ?

## 2021-12-04 NOTE — ED Notes (Signed)
Called 6N to ask for purpleman to be initiated.  ?

## 2021-12-05 DIAGNOSIS — Z8673 Personal history of transient ischemic attack (TIA), and cerebral infarction without residual deficits: Secondary | ICD-10-CM | POA: Diagnosis not present

## 2021-12-05 DIAGNOSIS — G893 Neoplasm related pain (acute) (chronic): Secondary | ICD-10-CM | POA: Diagnosis not present

## 2021-12-05 DIAGNOSIS — J8410 Pulmonary fibrosis, unspecified: Secondary | ICD-10-CM | POA: Diagnosis not present

## 2021-12-05 DIAGNOSIS — C3490 Malignant neoplasm of unspecified part of unspecified bronchus or lung: Secondary | ICD-10-CM | POA: Diagnosis not present

## 2021-12-05 MED ORDER — SENNOSIDES-DOCUSATE SODIUM 8.6-50 MG PO TABS
1.0000 | ORAL_TABLET | Freq: Two times a day (BID) | ORAL | Status: DC
  Administered 2021-12-05 – 2021-12-07 (×5): 1 via ORAL
  Filled 2021-12-05 (×5): qty 1

## 2021-12-05 NOTE — Progress Notes (Signed)
? ?                                                                                                                                                     ?                                                   ?Daily Progress Note  ? ?Patient Name: Monica Murillo       Date: 12/05/2021 ?DOB: 1933-01-28  Age: 88 y.o. MRN#: [id number] ?Attending Physician: Florencia Reasons, MD ?Primary Care Physician: Yvonna Alanis, NP ?Admit Date: 12/01/2021 ? ?Reason for Consultation/Follow-up: Establishing goals of care ? ?Subjective: ?Medical records reviewed. Patient assessed at the bedside. Reports symptoms remain well-controlled other than incident of speech difficulty this afternoon which has since resolved.  ? ?Called patient's daughter Adonis Brook to provide ongoing palliative support and she shares that she continues to coordinate for patient's safe discharge home with hospice and searching for private caregivers. She is worried about how quickly patient has already declined, as they were out eating lunch just a week ago, and how patient's care needs will be managing if she continues to decline at this rate. She wishes to have more concrete information regarding patient's expected trajectory and prognosis, although she also understands it is very difficult to estimate without additional diagnostic information. ? ?Questions and concerns addressed. PMT will continue to support holistically. ? ?Length of Stay: 3 ? ?Current Medications: ?Scheduled Meds:  ? antiseptic oral rinse  15 mL Topical BID  ? aspirin  300 mg Rectal Daily  ? Or  ? aspirin  325 mg Oral Daily  ? gabapentin  200 mg Oral QHS  ? levothyroxine  125 mcg Oral QAC breakfast  ? rivaroxaban  10 mg Oral Daily  ? senna-docusate  1 tablet Oral BID  ? ? ?PRN Meds: ?acetaminophen **OR** acetaminophen, diphenhydrAMINE, glycopyrrolate **OR** glycopyrrolate **OR** glycopyrrolate, haloperidol **OR** haloperidol **OR** haloperidol lactate, LORazepam **OR** LORazepam **OR** LORazepam, morphine  injection, ondansetron (ZOFRAN) IV, oxyCODONE, polyvinyl alcohol, senna-docusate ? ?Physical Exam ?Vitals and nursing note reviewed.  ?Constitutional:   ?   General: She is not in acute distress. ?   Appearance: She is ill-appearing.  ?Cardiovascular:  ?   Rate and Rhythm: Normal rate.  ?Pulmonary:  ?   Effort: Pulmonary effort is normal. No respiratory distress.  ?Skin: ?   General: Skin is warm and dry.  ?Neurological:  ?   Mental Status: She is alert. Mental status is at baseline.  ?Psychiatric:     ?   Mood and Affect: Mood normal.  ?         ? ?Vital Signs: BP (!) 145/79 (BP  Location: Left Arm)   Pulse 77   Temp 97.7 ?F (36.5 ?C) (Oral)   Resp (!) 22   Ht 5' (1.524 m)   Wt 49 kg   SpO2 98%   BMI 21.09 kg/m?  ?SpO2: SpO2: 98 % ?O2 Device: O2 Device: Nasal Cannula ?O2 Flow Rate: O2 Flow Rate (L/min): 2 L/min ? ?Intake/output summary:  ?Intake/Output Summary (Last 24 hours) at 12/05/2021 1554 ?Last data filed at 12/04/2021 2200 ?Gross per 24 hour  ?Intake 30 ml  ?Output --  ?Net 30 ml  ? ?LBM:   ?Baseline Weight: Weight: 49 kg ?Most recent weight: Weight: 49 kg ? ?     ?Palliative Assessment/Data: 50% ? ? ? ?Flowsheet Rows   ? ?Flowsheet Row Most Recent Value  ?Intake Tab   ?Referral Department Hospitalist  ?Unit at Time of Referral ER  ?Palliative Care Primary Diagnosis Cancer  ?Date Notified 12/01/21  ?Palliative Care Type New Palliative care  ?Reason for referral Clarify Goals of Care  ?Date of Admission 12/01/21  ?Date first seen by Palliative Care 12/02/21  ?# of days Palliative referral response time 1 Day(s)  ?# of days IP prior to Palliative referral 0  ?Clinical Assessment   ?Psychosocial & Spiritual Assessment   ?Palliative Care Outcomes   ?Patient/Family meeting held? Yes  ?Who was at the meeting? patient/daughter  ?Palliative Care Outcomes Improved pain interventions, Improved non-pain symptom therapy, Clarified goals of care, Counseled regarding hospice, Provided end of life care assistance,  Provided psychosocial or spiritual support, Changed to focus on comfort, Transitioned to hospice  ?Patient/Family wishes: Interventions discontinued/not started  Mechanical Ventilation, Antibiotics, Tube feedings/TPN, BiPAP, Hemodialysis, NIPPV, Trach, Transfusion, PEG, Vasopressors, Transfer out of ICU  ? ?  ? ? ?Patient Active Problem List  ? Diagnosis Date Noted  ? Cancer related pain   ? End of life care   ? History of recurrent TIAs 12/01/2021  ? Dyslipidemia 12/01/2021  ? DNR (do not resuscitate) 12/01/2021  ? Aphasia 11/21/2021  ? Aphasia secondary to suspected TIA (transient ischemic attack) 11/21/2021  ? Normocytic anemia 11/21/2021  ? Metastatic lung cancer (metastasis from lung to other site) [facility]) 11/21/2021  ? Fall at home 08/22/2021  ? Essential hypertension 08/22/2021  ? PAF (paroxysmal atrial fibrillation) on chronic anticoagulation (HCC) Complete heart s/p pacemaker 08/22/2021  ? Femur fracture, right ([place]) 08/18/2021  ? Complete heart block ([place]) 07/30/2021  ? Pulmonary fibrosis ([place]) 07/30/2021  ? Type 2 diabetes mellitus with hyperglycemia, without long-term current use of insulin ([place]) 07/30/2021  ? Hypothyroidism 07/30/2021  ? Gastroesophageal reflux disease without esophagitis 07/30/2021  ? Macular degeneration of both eyes 07/30/2021  ? Chronic right hip pain 07/30/2021  ? Memory impairment 07/30/2021  ? Past heart attack 07/30/2021  ? ? ?Palliative Care Assessment & Plan  ? ?Patient Profile: ?88 y.o. female  with past medical history of pacemaker placement, CAD s/p stent, HTN, hypothyroidism, afib on Xarelto, pulmonary fibrosis on home O2, metastatic lung CA, and DM presented to the ED on 12/01/21 from [facility] after being found with slurred speech, facial droop, and right arm weakness. OF note, she was recently admitted from 4/2-11/22/21 with TIA. Patient was admitted on 12/01/2021 with recurrent TIA and metastatic lung cancer.  ?  ? ?Assessment: ?End of life care ?Goals of care  conversation ? ?Recommendations/Plan: ?Continue comfort care per [facility], no adjustments required today ?Patient's daughter will continue efforts to arrange private caregiver support in the home - anticipate Monday at the earliest ?  Psychosocial and emotional support provided ?PMT will continue to follow ? ?Prognosis: ? Weeks to months ? ?Discharge Planning: ?ILF with hospice ? ?Care plan was discussed with patient, patient's daughter ? ? ?Total time: ?I spent 30 minutes in the care of the patient today in the above activities and documenting the encounter. ? ? ?Dorthy Cooler, PA-C ?Palliative Medicine Team ?Team phone # [phone] ? ?Thank you for allowing the Palliative Medicine Team to assist in the care of this patient. Please utilize secure chat with additional questions, if there is no response within 30 minutes please call the above phone number. ? ?Palliative Medicine Team providers are available by phone from 7am to 7pm daily and can be reached through the team cell phone.  ?Should this patient require assistance outside of these hours, please call the patient's attending physician.  ? ? ? ?

## 2021-12-05 NOTE — Progress Notes (Signed)
CSW informed by Monica Murillo/[facility] that daughter not feeling comfortable with the plan for pt to DC home with hospice services.  CSW spoke with daughter Monica Murillo by phone.  Overall, daughter feel like she does not have a good idea of what to expect.  She has concerns about her mother not being able to get up for the bathroom, daughter would very much like to be able to use purewick but does not think hospice will allow this.  CSW was able to confirm with Monica Murillo that purewick is fine, but hospice cannot provide one.  This was passed on to [place], who will research getting one.  Daughter also has some additional questions for the MD.  We discussed the choices of home with hospice and private help vs some sort of assisted living with hospice.  CSW spoke with MD about some time to ask additional questions.  Monica Murillo will not be here tomorrow and confirmed that daughter being in the room tomorrow morning is best option to speak with the covering MD.  This is daughter's plan. ?Monica Murillo, MSW, LCSW ?4/16/20233:21 PM  ?

## 2021-12-05 NOTE — Progress Notes (Signed)
?PROGRESS NOTE ? ? ? ?BEMNET TROVATO  KVQ:[id number] DOB: September 04, 1932 DOA: 12/01/2021 ?PCP: Yvonna Alanis, NP  ? ? ? ?Brief Narrative:  ? ?Monica Murillo is a 88 y.o. female with medical history significant of pacemaker placement; CAD s/p stent; HTN; hypothyroidism; afib on Xarelto; pulmonary fibrosis on home O2; recently diagnosed metastatic lung CA; and DM presenting with stroke-like symptoms lasting for 20 minutes with recurrent aphasia ? ?Subjective: ? ? No acute interval changes ?Awaiting for safe discharge plan, discussed with daughter at bedside  ? ? ? ?Assessment & Plan: ? Principal Problem: ?  History of recurrent TIAs ?Active Problems: ?  Metastatic lung cancer (metastasis from lung to other site) [facility] ?  PAF (paroxysmal atrial fibrillation) on chronic anticoagulation (HCC) Complete heart s/p pacemaker ?  Hypothyroidism ?  Essential hypertension ?  Pulmonary fibrosis ([place]) ?  Type 2 diabetes mellitus with hyperglycemia, without long-term current use of insulin ([place]) ?  Dyslipidemia ?  DNR (do not resuscitate) ? ? ?Recurrent TIA ?Metastatic lung cancer ?A-fib ?Hypertension ?HLD ?Hypothyroidism ?Pulmonary fibrosis/chronic hypoxic respiratory failure on 2 L home O2 ?FTT ?  ? ?Patient and family has met with palliative care , started full comfort measures ?Initially daughter wants to take patient home with  home hospice  but appears she has difficulty finding appropriate private paid care to help at home, transitional care is trying to explore options ? ? ? ?DVT prophylaxis: rivaroxaban (XARELTO) tablet 10 mg Start: 12/02/21 1100 ?rivaroxaban (XARELTO) tablet 10 mg  ? ?Code Status:   Code Status: DNR ? ?Family Communication: Daughter at bedside on 4/13 and 4/15 ?Disposition:  ? ?Status is: Inpatient ? ?Dispo: The patient is from: Home/independent living ?             Anticipated d/c is to: TBD ?             Anticipated d/c date is: awaiting for safe discharge plan, appreciate palliative care/hospice  /transitional care assistance  ? ?Antimicrobials:   ? ?Anti-infectives (From admission, onward)  ? ? None  ? ?  ? ? ? ? ? ?Objective: ?Vitals:  ? 12/04/21 0630 12/04/21 0816 12/04/21 1200 12/05/21 0605  ?BP:  (!) 151/74 136/71 (!) 145/79  ?Pulse: 70 91 78 77  ?Resp:  16 17 (!) 22  ?Temp:  98.4 ?F (36.9 ?C)  97.7 ?F (36.5 ?C)  ?TempSrc:  Oral  Oral  ?SpO2: 96% 95% 96% 98%  ?Weight:      ?Height:      ? ? ?Intake/Output Summary (Last 24 hours) at 12/05/2021 1456 ?Last data filed at 12/04/2021 2200 ?Gross per 24 hour  ?Intake 30 ml  ?Output --  ?Net 30 ml  ? ?Filed Weights  ? 12/01/21 1237  ?Weight: 49 kg  ? ? ?Examination: ? ?General exam: alert, awake, communicative,calm, does not appear in acute distress ?Respiratory system:  on 2liter oxygen , Respiratory effort normal. ?Cardiovascular system:  RRR.  ? ? ? ?Data Reviewed: I have personally reviewed  labs and visualized  imaging studies since the last encounter and formulate the plan  ? ? ? ? ? ? ?Scheduled Meds: ? antiseptic oral rinse  15 mL Topical BID  ? aspirin  300 mg Rectal Daily  ? Or  ? aspirin  325 mg Oral Daily  ? gabapentin  200 mg Oral QHS  ? levothyroxine  125 mcg Oral QAC breakfast  ? rivaroxaban  10 mg Oral Daily  ? senna-docusate  1 tablet Oral  BID  ? ?Continuous Infusions: ? ? LOS: 3 days  ? ? ? ? ? ?Florencia Reasons, MD PhD FACP ?[facility] ? ?Available via Epic secure chat 7am-7pm for nonurgent issues ?Please page for urgent issues ?To page the attending provider between 7A-7P or the covering provider during after hours 7P-7A, please log into the web site [email or web address] and access using universal [place] password for that web site. If you do not have the password, please call the hospital operator. ? ? ? ?12/05/2021, 2:56 PM  ? ? ?

## 2021-12-05 NOTE — Progress Notes (Signed)
Pt called out with complaint of she felt her speech may be messing up again. Pt assessed. Mouth dry tongue cracked. Mouth moistened. Speech WNL no changed noted. Grips checked and pupils all WNL. Pt stated she was unable to sleep was wide awake. RN offered pt meds to help relax her and pt refused. Pt denied pain.  ?

## 2021-12-05 NOTE — Plan of Care (Addendum)
Pt alert and oriented x 4 until middle of night and pt became anxious and then confusion this am approx 0600. Ativan given due to pt anxious unable to sleep. See previous note regarding anxiety regarding speech. Pt refused synthroid this am. She kept stating "I've already had the medication." RN explained to pt and she still refused will pass on to day nurse. Pt refused pain meds denied pain. Pt refused turn but did allow to turn at once with purewick and pad change.  ?Problem: Coping: ?Goal: Level of anxiety will decrease ?Outcome: Not Progressing ?  ?Problem: Education: ?Goal: Knowledge of General Education information will improve ?Description: Including pain rating scale, medication(s)/side effects and non-pharmacologic comfort measures ?Outcome: Progressing ?  ?Problem: Health Behavior/Discharge Planning: ?Goal: Ability to manage health-related needs will improve ?Outcome: Progressing ?  ?Problem: Clinical Measurements: ?Goal: Ability to maintain clinical measurements within normal limits will improve ?Outcome: Progressing ?Goal: Will remain free from infection ?Outcome: Progressing ?Goal: Diagnostic test results will improve ?Outcome: Progressing ?Goal: Respiratory complications will improve ?Outcome: Progressing ?Goal: Cardiovascular complication will be avoided ?Outcome: Progressing ?  ?Problem: Activity: ?Goal: Risk for activity intolerance will decrease ?Outcome: Progressing ?  ?Problem: Nutrition: ?Goal: Adequate nutrition will be maintained ?Outcome: Progressing ?  ?Problem: Elimination: ?Goal: Will not experience complications related to bowel motility ?Outcome: Progressing ?Goal: Will not experience complications related to urinary retention ?Outcome: Progressing ?  ?Problem: Pain Managment: ?Goal: General experience of comfort will improve ?Outcome: Progressing ?  ?Problem: Safety: ?Goal: Ability to remain free from injury will improve ?Outcome: Progressing ?  ?Problem: Skin Integrity: ?Goal: Risk for  impaired skin integrity will decrease ?Outcome: Progressing ?  ?

## 2021-12-05 NOTE — Progress Notes (Signed)
[place] [phone] [facility] ? ?This liaison met with daughter and spouse at bedside who seemed to be upset about taking her mother home without assessment prior. She is concerned about taking her home without a purewick and what else for home care would be needed. She doesn't have help or someone skilled that can help care for her at the home and this was a concern to her. We discussed options of ALF with hospice vs home with hospice.  ? ?Notified TOC Greg and he is aware as well as MD. ? ?DME: hospital bed was delivered to the home and the patient has a AV nurse to come out Monday at 2pm, this may change if no discharge.  ? ?Please feel free to contact [facility] with any discharge planning.  ? ?Clementeen Hoof, BSN, RN ?Hospital Liaison ?[phone] ?

## 2021-12-06 DIAGNOSIS — G893 Neoplasm related pain (acute) (chronic): Secondary | ICD-10-CM | POA: Diagnosis not present

## 2021-12-06 DIAGNOSIS — Z8673 Personal history of transient ischemic attack (TIA), and cerebral infarction without residual deficits: Secondary | ICD-10-CM | POA: Diagnosis not present

## 2021-12-06 DIAGNOSIS — R638 Other symptoms and signs concerning food and fluid intake: Secondary | ICD-10-CM

## 2021-12-06 DIAGNOSIS — G459 Transient cerebral ischemic attack, unspecified: Secondary | ICD-10-CM | POA: Diagnosis not present

## 2021-12-06 DIAGNOSIS — C3490 Malignant neoplasm of unspecified part of unspecified bronchus or lung: Secondary | ICD-10-CM | POA: Diagnosis not present

## 2021-12-06 MED ORDER — FLEET ENEMA 7-19 GM/118ML RE ENEM
1.0000 | ENEMA | Freq: Once | RECTAL | Status: AC
  Administered 2021-12-06: 1 via RECTAL
  Filled 2021-12-06: qty 1

## 2021-12-06 MED ORDER — BISACODYL 10 MG RE SUPP
10.0000 mg | Freq: Once | RECTAL | Status: AC
  Administered 2021-12-06: 10 mg via RECTAL
  Filled 2021-12-06: qty 1

## 2021-12-06 MED ORDER — POLYETHYLENE GLYCOL 3350 17 G PO PACK
17.0000 g | PACK | Freq: Every day | ORAL | Status: DC
  Administered 2021-12-07: 17 g via ORAL
  Filled 2021-12-06 (×2): qty 1

## 2021-12-06 NOTE — Progress Notes (Signed)
[place]: ? ?I have met with pt and daughter. Discussion about pt condition and family concerns. Introduced pt family to [facility] in [place] and levels of care. They are ina agreement with these. I have reviewed with my MD and Dr. Ander Purpura has approved the pt for Hospice facility in [place]. We at this time do not have a bed to offer. I will update everyone when I know that we do to assist with d/c plan. Webb Silversmith RN  ?

## 2021-12-06 NOTE — Progress Notes (Signed)
?                                                                                                                                                     ?                                                   ?Daily Progress Note  ? ?Patient Name: Monica Murillo       Date: 12/06/2021 ?DOB: Feb 17, 1933  Age: 88 y.o. MRN#: [id number] ?Attending Physician: Shelly Coss, MD ?Primary Care Physician: Yvonna Alanis, NP ?Admit Date: 12/01/2021 ? ?Reason for Consultation/Follow-up: Disposition, Establishing goals of care, Non pain symptom management, Pain control, Psychosocial/spiritual support, and Terminal Care ? ?Subjective: ?Chart review performed. Received report from primary RN -no acute concerns.  RN reports patient with minimal oral intake. ? ?Went to visit patient at bedside -no family/visitors present.  She is awake, alert, oriented, and able to participate in conversation.  She appears frail and weak.  No signs or non-verbal gestures of pain or discomfort noted. No respiratory distress, increased work of breathing, or secretions noted.  She denies pain -reports oxycodone and morphine as needed are managing her pain at this time.  Noted she had to use morphine for the first time in 72 hours this morning , which can indicate increasing pain management needs.  She does endorse nausea at times - reports antiemetic is effective but her appetite remains poor.  She is on 2 L O2 nasal cannula. ? ?Called daughter/Christi.  Leroy Kennedy will be meeting with [facility] shortly.  Their goal is for patient discharge to residential hospice with [facility].  Therapeutic listening provided as Alyse Low reflects on patient's continual decline since admission -reports worsening pain as well as decreased oral intake. ? ?PMT will touch base with [facility] after her meeting with hospice liaison and evaluation completed. ? ?All questions and concerns addressed. Encouraged to call with questions and/or concerns. PMT  card previously provided. ? ?Length of Stay: 4 ? ?Current Medications: ?Scheduled Meds:  ? antiseptic oral rinse  15 mL Topical BID  ? aspirin  300 mg Rectal Daily  ? Or  ? aspirin  325 mg Oral Daily  ? bisacodyl  10 mg Rectal Once  ? gabapentin  200 mg Oral QHS  ? levothyroxine  125 mcg Oral QAC breakfast  ? rivaroxaban  10 mg Oral Daily  ? senna-docusate  1 tablet Oral BID  ? ? ?Continuous Infusions: ? ? ?PRN Meds: ?acetaminophen **OR** acetaminophen, diphenhydrAMINE, glycopyrrolate **OR** glycopyrrolate **OR** glycopyrrolate, haloperidol **OR** haloperidol **OR** haloperidol lactate, LORazepam **OR** LORazepam **OR** LORazepam, morphine injection, ondansetron (ZOFRAN) IV, oxyCODONE, polyvinyl  alcohol, senna-docusate ? ?Physical Exam ?Vitals and nursing note reviewed.  ?Constitutional:   ?   General: She is not in acute distress. ?   Appearance: She is ill-appearing.  ?Pulmonary:  ?   Effort: No respiratory distress.  ?Skin: ?   General: Skin is warm and dry.  ?   Coloration: Skin is pale.  ?Neurological:  ?   Mental Status: She is alert and oriented to person, place, and time.  ?   Motor: Weakness present.  ?Psychiatric:     ?   Attention and Perception: Attention normal.     ?   Behavior: Behavior is cooperative.     ?   Cognition and Memory: Cognition and memory normal.  ?         ? ?Vital Signs: BP (!) 162/79 (BP Location: Right Arm)   Pulse 86   Temp 98.3 ?F (36.8 ?C) (Oral)   Resp 18   Ht 5' (1.524 m)   Wt 49 kg   SpO2 93%   BMI 21.09 kg/m?  ?SpO2: SpO2: 93 % ?O2 Device: O2 Device: Nasal Cannula ?O2 Flow Rate: O2 Flow Rate (L/min): 2 L/min ? ?Intake/output summary:  ?Intake/Output Summary (Last 24 hours) at 12/06/2021 1201 ?Last data filed at 12/05/2021 2025 ?Gross per 24 hour  ?Intake 0 ml  ?Output 600 ml  ?Net -600 ml  ? ?LBM: Last BM Date :  (utd) ?Baseline Weight: Weight: 49 kg ?Most recent weight: Weight: 49 kg ? ?     ?Palliative Assessment/Data: PPS 20% ? ? ? ?Flowsheet Rows   ? ?Flowsheet Row  Most Recent Value  ?Intake Tab   ?Referral Department Hospitalist  ?Unit at Time of Referral ER  ?Palliative Care Primary Diagnosis Cancer  ?Date Notified 12/01/21  ?Palliative Care Type New Palliative care  ?Reason for referral Clarify Goals of Care  ?Date of Admission 12/01/21  ?Date first seen by Palliative Care 12/02/21  ?# of days Palliative referral response time 1 Day(s)  ?# of days IP prior to Palliative referral 0  ?Clinical Assessment   ?Psychosocial & Spiritual Assessment   ?Palliative Care Outcomes   ?Patient/Family meeting held? Yes  ?Who was at the meeting? patient/daughter  ?Palliative Care Outcomes Improved pain interventions, Improved non-pain symptom therapy, Clarified goals of care, Counseled regarding hospice, Provided end of life care assistance, Provided psychosocial or spiritual support, Changed to focus on comfort, Transitioned to hospice  ?Patient/Family wishes: Interventions discontinued/not started  Mechanical Ventilation, Antibiotics, Tube feedings/TPN, BiPAP, Hemodialysis, NIPPV, Trach, Transfusion, PEG, Vasopressors, Transfer out of ICU  ? ?  ? ? ?Patient Active Problem List  ? Diagnosis Date Noted  ? Cancer related pain   ? End of life care   ? History of recurrent TIAs 12/01/2021  ? Dyslipidemia 12/01/2021  ? DNR (do not resuscitate) 12/01/2021  ? Aphasia 11/21/2021  ? Aphasia secondary to suspected TIA (transient ischemic attack) 11/21/2021  ? Normocytic anemia 11/21/2021  ? Metastatic lung cancer (metastasis from lung to other site) [facility]) 11/21/2021  ? Fall at home 08/22/2021  ? Essential hypertension 08/22/2021  ? PAF (paroxysmal atrial fibrillation) on chronic anticoagulation (HCC) Complete heart s/p pacemaker 08/22/2021  ? Femur fracture, right ([place]) 08/18/2021  ? Complete heart block ([place]) 07/30/2021  ? Pulmonary fibrosis ([place]) 07/30/2021  ? Type 2 diabetes mellitus with hyperglycemia, without long-term current use of insulin ([place]) 07/30/2021  ? Hypothyroidism 07/30/2021  ?  Gastroesophageal reflux disease without esophagitis 07/30/2021  ? Macular degeneration of both eyes 07/30/2021  ?  Chronic right hip pain 07/30/2021  ? Memory impairment 07/30/2021  ? Past heart attack 07/30/2021  ? ? ?Palliative Care Assessment & Plan  ? ?Patient Profile: ?88 y.o. female  with past medical history of pacemaker placement, CAD s/p stent, HTN, hypothyroidism, afib on Xarelto, pulmonary fibrosis on home O2, metastatic lung CA, and DM presented to the ED on 12/01/21 from [facility] after being found with slurred speech, facial droop, and right arm weakness. OF note, she was recently admitted from 4/2-11/22/21 with TIA. Patient was admitted on 12/01/2021 with recurrent TIA and metastatic lung cancer.  ? ?Assessment: ?Principal Problem: ?  History of recurrent TIAs ?Active Problems: ?  Pulmonary fibrosis ([place]) ?  Type 2 diabetes mellitus with hyperglycemia, without long-term current use of insulin ([place]) ?  Hypothyroidism ?  Essential hypertension ?  PAF (paroxysmal atrial fibrillation) on chronic anticoagulation (HCC) Complete heart s/p pacemaker ?  Metastatic lung cancer (metastasis from lung to other site) [facility]) ?  Dyslipidemia ?  DNR (do not resuscitate) ?  Terminal care ? ?Recommendations/Plan: ?Continue full comfort measures ?Continue DNR/DNI as previously documented ?Goal is for discharge to residential hospice -daughter is hopeful for [facility] [place] -evaluation pending ?Continue current comfort focused medication regimen, no changes today ?PMT will continue to follow and support holistically ? ?Goals of Care and Additional Recommendations: ?Limitations on Scope of Treatment: Full Comfort Care ? ?Code Status: ? ?  ?Code Status Orders  ?(From admission, onward)  ?  ? ? ?  ? ?  Start     Ordered  ? 12/02/21 1132  Do not attempt resuscitation (DNR)  Continuous       ?Question Answer Comment  ?In the event of cardiac or respiratory ARREST Do not call a ?code blue?   ?In the event of  cardiac or respiratory ARREST Do not perform Intubation, CPR, defibrillation or ACLS   ?In the event of cardiac or respiratory ARREST Use medication by any route, position, wound care, and other measures to relive pain a

## 2021-12-06 NOTE — Care Management Important Message (Signed)
Important Message ? ?Patient Details  ?Name: Monica Murillo ?MRN: [id number] ?Date of Birth: 1933-01-02 ? ? ?Medicare Important Message Given:  Yes ? ?Patient is at end of life out of respect to the patient and the family will mail the IM to the patient home address.  ? ? ?Fotios Amos ?12/06/2021, 2:29 PM ?

## 2021-12-06 NOTE — Discharge Summary (Signed)
Physician Discharge Summary  ?Monica Murillo LKG:[id number] DOB: 1932-09-24 DOA: 12/01/2021 ? ?PCP: Yvonna Alanis, NP ? ?Admit date: 12/01/2021 ?Discharge date: 12/06/2021 ? ?Admitted From: Home ?Disposition:  residential hospice ? ?Discharge Condition:Stable ?CODE STATUS:Comfort care ? ? ?Brief/Interim Summary: ? ?Monica Murillo is a 88 y.o. female with medical history significant of pacemaker placement; CAD s/p stent; HTN; hypothyroidism; afib on Xarelto; pulmonary fibrosis on home O2; recently diagnosed metastatic lung CA; and DM presenting with stroke-like symptoms lasting for 20 minutes with recurrent aphasia. ?Due to her extremely poor prognosis from metastatic lung cancer, goals of care was discussed early.  Palliative care consulted.  After discussion with family, her care was transitioned to comfort.  Initially, there was plan for discharge to home with hospice but daughter is not not able to take care of her at home and she preferred residential hospice.  Referral has been made to residential hospice.  She can be transferred to residential hospice as soon as possible. ? ? ? ?Discharge Diagnoses:  ?Principal Problem: ?  History of recurrent TIAs ?Active Problems: ?  Metastatic lung cancer (metastasis from lung to other site) [facility]) ?  PAF (paroxysmal atrial fibrillation) on chronic anticoagulation (HCC) Complete heart s/p pacemaker ?  Hypothyroidism ?  Essential hypertension ?  Pulmonary fibrosis ([place]) ?  Type 2 diabetes mellitus with hyperglycemia, without long-term current use of insulin ([place]) ?  Dyslipidemia ?  DNR (do not resuscitate) ? ? ? ?Discharge Instructions ? ?Discharge Instructions   ? ? Diet general   Complete by: As directed ?  ? Increase activity slowly   Complete by: As directed ?  ? Increase activity slowly   Complete by: As directed ?  ? ?  ? ?Allergies as of 12/06/2021   ? ?   Reactions  ? Lidocaine-epinephrine Anxiety  ? Dizzy, disoriented, "cannot receive at the dentist"  ? Sulfa  Antibiotics Nausea And Vomiting, Rash  ? Sulfa and related   ? ?  ? ?  ?Medication List  ?  ? ?STOP taking these medications   ? ?acetaminophen 500 MG tablet ?Commonly known as: TYLENOL ?  ?docusate sodium 100 MG capsule ?Commonly known as: COLACE ?  ?esomeprazole 20 MG capsule ?Commonly known as: [place] ?  ?gabapentin 100 MG capsule ?Commonly known as: NEURONTIN ?  ?levothyroxine 125 MCG tablet ?Commonly known as: SYNTHROID ?  ?Lutein-Zeaxanthin 20-1 MG Caps ?  ?metoprolol succinate 25 MG 24 hr tablet ?Commonly known as: TOPROL-XL ?  ?OXYGEN ?  ?rivaroxaban 10 MG Tabs tablet ?Commonly known as: XARELTO ?  ?rosuvastatin 10 MG tablet ?Commonly known as: CRESTOR ?  ? ?  ? ? ?Allergies  ?Allergen Reactions  ? Lidocaine-Epinephrine Anxiety  ?  Dizzy, disoriented, "cannot receive at the dentist"  ? Sulfa Antibiotics Nausea And Vomiting and Rash  ?  Sulfa and related  ?  ? ? ?Consultations: ?Palliative care ? ? ?Procedures/Studies: ?CT HEAD WO CONTRAST ? ?Result Date: 11/21/2021 ?CLINICAL DATA:  Transient ischemic attack EXAM: CT HEAD WITHOUT CONTRAST TECHNIQUE: Contiguous axial images were obtained from the base of the skull through the vertex without intravenous contrast. RADIATION DOSE REDUCTION: This exam was performed according to the departmental dose-optimization program which includes automated exposure control, adjustment of the mA and/or kV according to patient size and/or use of iterative reconstruction technique. COMPARISON:  08/18/2021 FINDINGS: Brain: No evidence of acute infarction, hemorrhage, cerebral edema, mass, mass effect, or midline shift. No hydrocephalus or extra-axial fluid collection. Periventricular white matter changes,  likely the sequela of chronic small vessel ischemic disease. 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 Vascular: No hyperdense vessel. Atherosclerotic calcifications in the intracranial carotid and vertebral arteries. Skull:  Normal. Negative for fracture or focal lesion. Sinuses/Orbits: No acute finding. Other: The mastoid air cells are well aerated. IMPRESSION: IMPRESSION No acute intracranial process. Electronically Signed   By: Merilyn Baba M.D.   On: 11/21/2021 12:28  ? ?MR ANGIO HEAD WO CONTRAST ? ?Result Date: 11/22/2021 ?CLINICAL DATA:  Stroke/TIA EXAM: MRI HEAD WITHOUT AND WITH CONTRAST MRA HEAD WITHOUT CONTRAST MRA NECK WITHOUT CONTRAST TECHNIQUE: Multiplanar, multiecho pulse sequences of the brain and surrounding structures were obtained without and with intravenous contrast. Angiographic images of the Circle of Willis were obtained using MRA technique without intravenous contrast. Angiographic images of the neck were obtained using MRA technique without intravenous contrast. Carotid stenosis measurements (when applicable) are obtained utilizing NASCET criteria, using the distal internal carotid diameter as the denominator. COMPARISON:  Noncontrast CT head dated 1 day prior, 08/18/2021 FINDINGS: Image quality is intermittently motion degraded. MRI HEAD FINDINGS Brain: There is no evidence of acute intracranial hemorrhage, extra-axial fluid collection, or acute infarct. There is T2/FLAIR signal abnormality with associated susceptibility artifact in the right cerebellar hemisphere corresponding to the calcification seen on prior head CT. There is apparent enhancement within this area of signal abnormality measuring approximately 1.3 cm x 0.6 cm (18-18). There is a background of moderate global parenchymal volume loss with prominence of the ventricular system and extra-axial CSF spaces. Confluent FLAIR signal abnormality in the subcortical and periventricular white matter likely reflects sequela of chronic white matter microangiopathy. There is no solid mass lesion. There is no other abnormal enhancement. There is no mass effect or midline shift. Vascular: The major intracranial flow voids are present. The vasculature is assessed  in full below. Skull and upper cervical spine: There is no definite signal abnormality, though the sagittal T1 sequence is markedly motion degraded. Sinuses/Orbits: The paranasal sinuses are clear. Bilateral lens implants are in place. The globes and orbits are otherwise unremarkable. Other: None. MRA HEAD FINDINGS Anterior circulation: The intracranial ICAs are patent, without hemodynamically significant stenosis or occlusion. The bilateral MCAs are patent. The bilateral ACAs are patent. The anterior communicating artery is not definitely seen. There is no aneurysm or AVM. Posterior circulation: The bilateral V4 segments are patent. The PICA origin is identified on the left but not well seen on the right. The basilar artery is patent. There is mild irregularity of the right P1 segment. There is short-segment moderate to severe stenosis of the right P2 segment (3-96). There is mild irregularity of the distal right PCA branches. The left PCA is patent with mild atherosclerotic irregularity. There is small bilateral posterior communicating arteries are identified. Anatomic variants: None. MRA NECK FINDINGS Aortic arch: The imaged aortic arch is unremarkable. The origins of the left common carotid and left subclavian arteries are patent. The brachiocephalic origin is not imaged. Right carotid system: The right common, internal, and external carotid arteries appear patent, without evidence of hemodynamically significant stenosis or occlusion. Left carotid system: The left common, internal, and external carotid arteries appear patent, without evidence of hemodynamically significant stenosis or occlusion. Vertebral arteries: Vertebral arteries are patent with antegrade flow. There is no evidence of hemodynamically significant stenosis or occlusion. Other: None. IMPRESSION: MR HEAD: 1. No evidence of acute intracranial pathology. No acute infarct or hemorrhage. 2. Signal abnormality in the right cerebellar hemisphere  corresponds  to the area of encephalomalacia and dystrophic calcification seen on prior CT with nodular enhancement within the area of signal abnormality. Follow-up MRI with contrast in 2-3 months may be considered to

## 2021-12-06 NOTE — TOC Progression Note (Addendum)
Transition of Care (TOC) - Progression Note  ? ? ?Patient Details  ?Name: Monica Murillo ?MRN: [id number] ?Date of Birth: June 22, 1933 ? ?Transition of Care (TOC) CM/SW Contact  ?Monica Favre, RN ?Phone Number: ?12/06/2021, 10:15 AM ? ?Clinical Narrative:    ? ? ?Spoke to daughter Monica Murillo [phone] at bedside. Plan was Monica Murillo was going to take her mother back to [employer], with [facility] home hospice and hire caregivers. Monica Murillo feels her mother is declining quickly and prefers [facility]. NCM explained NCM will do a referral to [facility] , they will need to review patient and assess her to see if they can offer a bed. Monica Murillo voiced understanding.  ? ?Referral called to [facility] with [facility] [place]. Monica Murillo will be in touch with Monica Murillo. Christie aware.  ? ?MD , nurse and [facility] also aware.  ?  ?  ?98 PAtient has been approved for [facility]osp[facility] in [facility] , no bed today, hopeful for tomorrow  ?Expected Discharge Plan and Services ?  ?  ?  ?  ?  ?Expected Discharge Date: 12/04/21               ?  ?  ?  ?  ?  ?  ?  ?  ?  ?  ? ? ?Social Determinants of Health (SDOH) Interventions ?  ? ?Readmission Risk Interventions ?   ? View : No data to display.  ?  ?  ?  ? ? ?

## 2021-12-07 ENCOUNTER — Other Ambulatory Visit: Payer: Medicare Other

## 2021-12-07 ENCOUNTER — Ambulatory Visit: Payer: Medicare Other | Admitting: Internal Medicine

## 2021-12-07 NOTE — TOC Progression Note (Signed)
Transition of Care (TOC) - Progression Note  ? ? ?Patient Details  ?Name: CHRISTIANNE ZACHER ?MRN: [id number] ?Date of Birth: 1932-12-19 ? ?Transition of Care (TOC) CM/SW Contact  ?Marilu Favre, RN ?Phone Number: ?12/07/2021, 11:40 AM ? ?Clinical Narrative:    ? ? ? ? Has accepted bed at [facility] in [facility]. Nurse has number to call for report. Cheri with [facility] in [facility] requesting [facility] pick up at 3 pm. NCM called [facility] and requested 3 pm pick up for transport. [facility] paperwork and DNR form in chart  ?  ? ?Expected Discharge Plan and Services ?  ?  ?  ?  ?  ?Expected Discharge Date: 12/06/21               ?  ?  ?  ?  ?  ?  ?  ?  ?  ?  ? ? ?Social Determinants of Health (SDOH) Interventions ?  ? ?Readmission Risk Interventions ?   ? View : No data to display.  ?  ?  ?  ? ? ?

## 2021-12-07 NOTE — Progress Notes (Signed)
Patient Monica Murillo      DOB: May 11, 1933      CXK:[id number] ? ? ? ?  ?Palliative Medicine Team ? ? ? ?Subjective: Bedside symptom check completed. Daughter bedside at time of visit.  ? ? ?Physical exam: Patient alert in bed, pleasantly conversational with daughter and eating crackers at time of visit. Breathing even and non-labored with nasal cannula in place, no excessive secretions noted. Patient without physical or non-verbal signs of pain or discomfort at this time. Patient reports having a bowel movement early this morning and feeling much better after. Skin is warm to touch, pulses strong.  ? ? ?Assessment and plan: Patient's daughter reports the patient just received morphine IV, but that her overall pain control is good today. Patient in agreement, without any concerns to voice. At this point, patient has been approved for acceptance in residential hospice at [place], but no bed is currently able to be offered. Patient remains stable for transport this morning. Bedside RN without needs. Will continue to follow for any changes or advances.  ? ? ?Thank you for allowing the Palliative Medicine Team to assist in the care of this patient. ?  ?  ?Damian Leavell, MSN, RN ?Palliative Medicine Team ?Team Phone: [phone]  ?This phone is monitored 7a-7p, please reach out to attending physician outside of these hours for urgent needs.   ?

## 2021-12-07 NOTE — Progress Notes (Signed)
Patient seen and examined at the bedside this morning.  Daughter at the bedside.  She is on comfort care and she is waiting for residential hospice bed.  She was not in any kind of distress this morning.  There is no change in the medical management.  Discharge orders and summary are in place. ?

## 2021-12-07 NOTE — Plan of Care (Signed)

## 2021-12-07 NOTE — Progress Notes (Signed)
Report given to [place] RN asked to leave IV in. ? ?

## 2021-12-07 NOTE — Progress Notes (Signed)
? ?  This pt was referred to [facility] in [facility]. We did approve the pt to go to our facility yesterday however, did not have a bed available. We do have a bed to offer today. The daughter has accepted the bed offer today. She will complete paperwork by docusign since she is not at bedside at this time. Pt can have ambulance arranged at 300pm today for transport. Selena Lesser RN [phone]  ?

## 2021-12-07 NOTE — Progress Notes (Signed)
Patient discharged to [facility], packet sent with [facility]. Patient has all belongings. Report given to Mickel Baas on previous shift. IV left in place per receiving facility.  ?

## 2021-12-07 NOTE — Care Management Important Message (Signed)
Important Message ? ?Patient Details  ?Name: Monica Murillo ?MRN: [id number] ?Date of Birth: [birth year] ? ? ?Medicare Important Message Given:  Yes ? ? ? ? ?Orlando Devereux ?12/07/2021, 8:31 AM ?

## 2021-12-08 ENCOUNTER — Telehealth: Payer: Self-pay | Admitting: *Deleted

## 2021-12-08 NOTE — Telephone Encounter (Signed)
Transition Care Management Unsuccessful Follow-up Telephone Call ? ?Date of discharge and from where:  12/06/2021 [place] ? ?Attempts:  1st Attempt ? ?Reason for unsuccessful TCM follow-up call:  Unable to reach patient [place].  ? ?  ?

## 2021-12-09 ENCOUNTER — Encounter: Payer: Self-pay | Admitting: *Deleted

## 2021-12-09 NOTE — Progress Notes (Signed)
Per EMR progress notes, patient is under Hospice care.   ?

## 2021-12-14 ENCOUNTER — Encounter (HOSPITAL_COMMUNITY): Payer: Self-pay

## 2021-12-14 ENCOUNTER — Ambulatory Visit (HOSPITAL_COMMUNITY): Admit: 2021-12-14 | Payer: Medicare Other | Admitting: Pulmonary Disease

## 2021-12-14 SURGERY — ENDOBRONCHIAL ULTRASOUND (EBUS)
Anesthesia: General

## 2021-12-20 DEATH — deceased

## 2022-01-31 ENCOUNTER — Ambulatory Visit: Payer: Medicare Other | Admitting: Cardiology

## 2022-02-01 ENCOUNTER — Inpatient Hospital Stay: Payer: Medicare Other | Admitting: Neurology
